# Patient Record
Sex: Male | Born: 1960
Health system: Southern US, Community
[De-identification: ages and names within clinical notes are randomized; demographics above are authoritative.]

## PROBLEM LIST (undated history)

## (undated) DIAGNOSIS — I10 Essential (primary) hypertension: Secondary | ICD-10-CM

## (undated) DIAGNOSIS — F32A Depression, unspecified: Secondary | ICD-10-CM

## (undated) DIAGNOSIS — F329 Major depressive disorder, single episode, unspecified: Secondary | ICD-10-CM

## (undated) DIAGNOSIS — M109 Gout, unspecified: Secondary | ICD-10-CM

## (undated) DIAGNOSIS — E119 Type 2 diabetes mellitus without complications: Secondary | ICD-10-CM

## (undated) DIAGNOSIS — N189 Chronic kidney disease, unspecified: Secondary | ICD-10-CM

## (undated) DIAGNOSIS — R296 Repeated falls: Secondary | ICD-10-CM

## (undated) DIAGNOSIS — E785 Hyperlipidemia, unspecified: Secondary | ICD-10-CM

## (undated) DIAGNOSIS — R2689 Other abnormalities of gait and mobility: Secondary | ICD-10-CM

## (undated) DIAGNOSIS — I639 Cerebral infarction, unspecified: Secondary | ICD-10-CM

## (undated) HISTORY — DX: Type 2 diabetes mellitus without complications: E11.9

## (undated) HISTORY — DX: Hyperlipidemia, unspecified: E78.5

## (undated) HISTORY — DX: Gout, unspecified: M10.9

## (undated) HISTORY — DX: Other abnormalities of gait and mobility: R26.89

## (undated) HISTORY — DX: Repeated falls: R29.6

## (undated) HISTORY — DX: Chronic kidney disease, unspecified: N18.9

## (undated) HISTORY — DX: Depression, unspecified: F32.A

---
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
## 2011-08-10 ENCOUNTER — Emergency Department (HOSPITAL_COMMUNITY)
Admission: EM | Admit: 2011-08-10 | Discharge: 2011-08-10 | Disposition: A | Discharge status: Home / Self Care | Payer: Self-pay | Attending: Emergency Medicine | Admitting: Emergency Medicine

## 2011-08-10 DIAGNOSIS — K089 Disorder of teeth and supporting structures, unspecified: Secondary | ICD-10-CM | POA: Insufficient documentation

## 2011-08-10 DIAGNOSIS — I10 Essential (primary) hypertension: Secondary | ICD-10-CM | POA: Insufficient documentation

## 2012-11-21 ENCOUNTER — Emergency Department (HOSPITAL_COMMUNITY)
Admission: EM | Admit: 2012-11-21 | Discharge: 2012-11-21 | Disposition: A | Discharge status: Home / Self Care | Payer: Self-pay | Attending: Emergency Medicine | Admitting: Emergency Medicine

## 2012-11-21 ENCOUNTER — Encounter (HOSPITAL_COMMUNITY): Payer: Self-pay | Admitting: *Deleted

## 2012-11-21 DIAGNOSIS — I10 Essential (primary) hypertension: Secondary | ICD-10-CM | POA: Insufficient documentation

## 2012-11-21 DIAGNOSIS — R6883 Chills (without fever): Secondary | ICD-10-CM | POA: Insufficient documentation

## 2012-11-21 DIAGNOSIS — K5289 Other specified noninfective gastroenteritis and colitis: Secondary | ICD-10-CM | POA: Insufficient documentation

## 2012-11-21 DIAGNOSIS — K529 Noninfective gastroenteritis and colitis, unspecified: Secondary | ICD-10-CM

## 2012-11-21 DIAGNOSIS — R059 Cough, unspecified: Secondary | ICD-10-CM | POA: Insufficient documentation

## 2012-11-21 DIAGNOSIS — Z8673 Personal history of transient ischemic attack (TIA), and cerebral infarction without residual deficits: Secondary | ICD-10-CM | POA: Insufficient documentation

## 2012-11-21 DIAGNOSIS — R197 Diarrhea, unspecified: Secondary | ICD-10-CM | POA: Insufficient documentation

## 2012-11-21 DIAGNOSIS — IMO0001 Reserved for inherently not codable concepts without codable children: Secondary | ICD-10-CM | POA: Insufficient documentation

## 2012-11-21 DIAGNOSIS — Z7982 Long term (current) use of aspirin: Secondary | ICD-10-CM | POA: Insufficient documentation

## 2012-11-21 DIAGNOSIS — F17200 Nicotine dependence, unspecified, uncomplicated: Secondary | ICD-10-CM | POA: Insufficient documentation

## 2012-11-21 DIAGNOSIS — J3489 Other specified disorders of nose and nasal sinuses: Secondary | ICD-10-CM | POA: Insufficient documentation

## 2012-11-21 DIAGNOSIS — R05 Cough: Secondary | ICD-10-CM | POA: Insufficient documentation

## 2012-11-21 HISTORY — DX: Cerebral infarction, unspecified: I63.9

## 2012-11-21 HISTORY — DX: Essential (primary) hypertension: I10

## 2012-11-21 LAB — COMPREHENSIVE METABOLIC PANEL
ALT: 13 U/L (ref 0–53)
AST: 14 U/L (ref 0–37)
Alkaline Phosphatase: 82 U/L (ref 39–117)
CO2: 23 mEq/L (ref 19–32)
Calcium: 9.1 mg/dL (ref 8.4–10.5)
Chloride: 103 mEq/L (ref 96–112)
GFR calc Af Amer: >90 mL/min (ref >90)
GFR calc non Af Amer: >90 mL/min (ref >90)
Glucose, Bld: 95 mg/dL (ref 70–99)
Potassium: 3.2 mEq/L — ABNORMAL LOW (ref 3.5–5.1)
Sodium: 137 mEq/L (ref 135–145)

## 2012-11-21 LAB — CBC WITH DIFFERENTIAL/PLATELET
Basophils Relative: 0 % (ref 0–1)
Eosinophils Absolute: 0.1 10*3/uL (ref 0.0–0.7)
Eosinophils Relative: 1 % (ref 0–5)
HCT: 42.2 % (ref 39.0–52.0)
Hemoglobin: 14.4 g/dL (ref 13.0–17.0)
Lymphocytes Relative: 30 % (ref 12–46)
MCHC: 34.1 g/dL (ref 30.0–36.0)
Monocytes Relative: 13 % — ABNORMAL HIGH (ref 3–12)
Neutro Abs: 6.8 10*3/uL (ref 1.7–7.7)
Neutrophils Relative %: 56 % (ref 43–77)
RBC: 5.65 MIL/uL (ref 4.22–5.81)
WBC: 12.1 10*3/uL — ABNORMAL HIGH (ref 4.0–10.5)

## 2012-11-21 MED ORDER — SODIUM CHLORIDE 0.9 % IV BOLUS (SEPSIS)
1000.0000 mL | Freq: Once | INTRAVENOUS | Status: AC
  Administered 2012-11-21: 1000 mL via INTRAVENOUS

## 2012-11-21 MED ORDER — ONDANSETRON HCL 4 MG/2ML IJ SOLN
4.0000 mg | Freq: Once | INTRAMUSCULAR | Status: AC
  Administered 2012-11-21: 4 mg via INTRAVENOUS
  Filled 2012-11-21: qty 2

## 2012-11-21 MED ORDER — KETOROLAC TROMETHAMINE 30 MG/ML IJ SOLN
30.0000 mg | Freq: Once | INTRAMUSCULAR | Status: AC
  Administered 2012-11-21: 30 mg via INTRAVENOUS
  Filled 2012-11-21: qty 1

## 2012-11-21 MED ORDER — POTASSIUM CHLORIDE CRYS ER 20 MEQ PO TBCR
40.0000 meq | EXTENDED_RELEASE_TABLET | Freq: Once | ORAL | Status: AC
  Administered 2012-11-21: 40 meq via ORAL
  Filled 2012-11-21: qty 2

## 2012-11-21 NOTE — ED Notes (Signed)
N/v/d, abd cramping, chills, cough, congestion x 4 days.

## 2012-11-21 NOTE — ED Provider Notes (Signed)
History    Scribed for Isaiah Lennert, MD, the patient was seen in room APA06/APA06. This chart was scribed by Katha Cabal.   CSN: [id number]  Arrival date & time 11/21/12  0821   First MD Initiated Contact with Patient 11/21/12 [phone]      Chief Complaint  Patient presents with  . n/v/d      (Consider location/radiation/quality/duration/timing/severity/associated sxs/prior Treatment)  Isaiah Lennert, MD entered patient's room at 8:36 AM  Patient is a 52 y.o. male presenting with diarrhea. The history is provided by the patient and the spouse. No language interpreter was used.  Diarrhea The primary symptoms include abdominal pain, nausea, vomiting, diarrhea and myalgias. Primary symptoms do not include fatigue or rash. Fever: resolved. The illness began 3 to 5 days ago. The problem has not changed since onset. The abdominal pain is generalized.  The diarrhea is watery.  The illness is also significant for chills. The illness does not include back pain.      Isaiah Ellis is a 52 y.o. male complains of persistent nausea, vomiting, diarrhea with associated abdominal aches for the past 4 days.  Patient has taken ASA and ibuprofen.  Reports two episodes of watery stools today.  Patient with history of hypertension but is not taking medication.      No primary provider on file.       Past Medical History  Diagnosis Date  . Hypertension   . Stroke     History reviewed. No pertinent past surgical history.  No family history on file.  History  Substance Use Topics  . Smoking status: Current Every Day Smoker    Types: Cigarettes  . Smokeless tobacco: Not on file  . Alcohol Use: Yes     Comment: occasional      Review of Systems  Constitutional: Positive for chills. Negative for fatigue. Fever: resolved.  HENT: Positive for congestion. Negative for sinus pressure and ear discharge.   Eyes: Negative for discharge.  Respiratory: Positive for cough.     Cardiovascular: Negative for chest pain.  Gastrointestinal: Positive for nausea, vomiting, abdominal pain and diarrhea.  Genitourinary: Negative for frequency and hematuria.  Musculoskeletal: Positive for myalgias. Negative for back pain.  Skin: Negative for rash.  Neurological: Negative for seizures and headaches.  Hematological: Negative.   Psychiatric/Behavioral: Negative for hallucinations.    Allergies  Review of patient's allergies indicates no known allergies.  Home Medications   Current Outpatient Rx  Name  Route  Sig  Dispense  Refill  . ASPIRIN 325 MG PO TABS   Oral   Take 325 mg by mouth daily.         . IBUPROFEN 200 MG PO TABS   Oral   Take 800 mg by mouth every 6 (six) hours as needed. Pain.           BP 119/78  Pulse 71  Temp 97.8 F (36.6 C) (Oral)  Resp 19  Ht 6\' 1"  (1.854 m)  Wt 300 lb (136.079 kg)  BMI 39.58 kg/m2  SpO2 98%  Physical Exam  Constitutional: He is oriented to person, place, and time. He appears well-developed.  HENT:  Head: Normocephalic and atraumatic.  Eyes: Conjunctivae normal and EOM are normal. No scleral icterus.  Neck: Neck supple. No thyromegaly present.  Cardiovascular: Normal rate and regular rhythm.  Exam reveals no gallop and no friction rub.   No murmur heard. Pulmonary/Chest: No stridor. He has no wheezes. He has no rales. He  exhibits no tenderness.  Abdominal: He exhibits no distension. There is no tenderness. There is no rebound.  Musculoskeletal: Normal range of motion. He exhibits no edema.  Lymphadenopathy:    He has no cervical adenopathy.  Neurological: He is oriented to person, place, and time. Coordination normal.  Skin: No rash noted. No erythema.  Psychiatric: He has a normal mood and affect. His behavior is normal.    ED Course  Procedures (including critical care time)    DIAGNOSTIC STUDIES: Oxygen Saturation is 98% on room air normal by my interpretation.     COORDINATION OF CARE:   8:40  AM  Physical exam complete.  Vital signs reviewed.  Will order antiemetic.     8:45 AM  Toradol and Zofran ordered.  IV fluids.   11:08 AM  Discussed radiological findings with patient.   11:10 AM  Plan to discharge patient.  Patient agrees with plan.       LABS / RADIOLOGY:   Labs Reviewed  CBC WITH DIFFERENTIAL - Abnormal; Notable for the following:    WBC 12.1 (*)     MCV 74.7 (*)     MCH 25.5 (*)     RDW 16.1 (*)     Monocytes Relative 13 (*)     Monocytes Absolute 1.6 (*)     All other components within normal limits  COMPREHENSIVE METABOLIC PANEL - Abnormal; Notable for the following:    Potassium 3.2 (*)     All other components within normal limits   No results found.       MDM             MEDICATIONS GIVEN IN THE E.D. Scheduled Meds:    Continuous Infusions:        IMPRESSION: No diagnosis found.   NEW MEDICATIONS: New Prescriptions   No medications on file      The chart was scribed for me under my direct supervision.  I personally performed the history, physical, and medical decision making and all procedures in the evaluation of this patient.Isaiah Lennert, MD 11/21/12 [phone]

## 2019-02-13 ENCOUNTER — Emergency Department (HOSPITAL_COMMUNITY): Payer: Self-pay

## 2019-02-13 ENCOUNTER — Encounter (HOSPITAL_COMMUNITY): Payer: Self-pay | Admitting: Emergency Medicine

## 2019-02-13 ENCOUNTER — Other Ambulatory Visit: Payer: Self-pay

## 2019-02-13 ENCOUNTER — Emergency Department (HOSPITAL_COMMUNITY)
Admission: EM | Admit: 2019-02-13 | Discharge: 2019-02-13 | Disposition: A | Discharge status: Home / Self Care | Payer: Self-pay | Attending: Emergency Medicine | Admitting: Emergency Medicine

## 2019-02-13 DIAGNOSIS — F17210 Nicotine dependence, cigarettes, uncomplicated: Secondary | ICD-10-CM | POA: Insufficient documentation

## 2019-02-13 DIAGNOSIS — Z8673 Personal history of transient ischemic attack (TIA), and cerebral infarction without residual deficits: Secondary | ICD-10-CM | POA: Insufficient documentation

## 2019-02-13 DIAGNOSIS — I10 Essential (primary) hypertension: Secondary | ICD-10-CM | POA: Insufficient documentation

## 2019-02-13 DIAGNOSIS — Z7982 Long term (current) use of aspirin: Secondary | ICD-10-CM | POA: Insufficient documentation

## 2019-02-13 LAB — CBG MONITORING, ED: Glucose-Capillary: 140 mg/dL — ABNORMAL HIGH (ref 70–99)

## 2019-02-13 LAB — COMPREHENSIVE METABOLIC PANEL
ALT: 12 U/L (ref 0–44)
AST: 19 U/L (ref 15–41)
Albumin: 4.3 g/dL (ref 3.5–5.0)
Alkaline Phosphatase: 98 U/L (ref 38–126)
Anion gap: 8 (ref 5–15)
BUN: 15 mg/dL (ref 6–20)
CO2: 22 mmol/L (ref 22–32)
CREATININE: 1.25 mg/dL — AB (ref 0.61–1.24)
Calcium: 9.4 mg/dL (ref 8.9–10.3)
Chloride: 109 mmol/L (ref 98–111)
GFR calc Af Amer: >60 mL/min (ref >60)
GFR calc non Af Amer: >60 mL/min (ref >60)
Glucose, Bld: 132 mg/dL — ABNORMAL HIGH (ref 70–99)
POTASSIUM: 3.6 mmol/L (ref 3.5–5.1)
SODIUM: 139 mmol/L (ref 135–145)
Total Bilirubin: 0.5 mg/dL (ref 0.3–1.2)
Total Protein: 8 g/dL (ref 6.5–8.1)

## 2019-02-13 LAB — DIFFERENTIAL
ABS IMMATURE GRANULOCYTES: 0.02 10*3/uL (ref 0.00–0.07)
BASOS ABS: 0.1 10*3/uL (ref 0.0–0.1)
Basophils Relative: 1 %
EOS ABS: 0.2 10*3/uL (ref 0.0–0.5)
Eosinophils Relative: 2 %
IMMATURE GRANULOCYTES: 0 %
Lymphocytes Relative: 22 %
Lymphs Abs: 2 10*3/uL (ref 0.7–4.0)
Monocytes Absolute: 0.8 10*3/uL (ref 0.1–1.0)
Monocytes Relative: 9 %
NEUTROS ABS: 6 10*3/uL (ref 1.7–7.7)
Neutrophils Relative %: 66 %

## 2019-02-13 LAB — APTT: aPTT: 34 seconds (ref 24–36)

## 2019-02-13 LAB — CBC
HCT: 47.3 % (ref 39.0–52.0)
HEMOGLOBIN: 15.2 g/dL (ref 13.0–17.0)
MCH: 24.2 pg — ABNORMAL LOW (ref 26.0–34.0)
MCHC: 32.1 g/dL (ref 30.0–36.0)
MCV: 75.2 fL — ABNORMAL LOW (ref 80.0–100.0)
NRBC: 0 % (ref 0.0–0.2)
Platelets: 256 10*3/uL (ref 150–400)
RBC: 6.29 MIL/uL — AB (ref 4.22–5.81)
RDW: 18.2 % — ABNORMAL HIGH (ref 11.5–15.5)
WBC: 9.1 10*3/uL (ref 4.0–10.5)

## 2019-02-13 LAB — PROTIME-INR
INR: 1 (ref 0.8–1.2)
Prothrombin Time: 13.4 seconds (ref 11.4–15.2)

## 2019-02-13 MED ORDER — HYDRALAZINE HCL 25 MG PO TABS
25.0000 mg | ORAL_TABLET | Freq: Once | ORAL | Status: AC
  Administered 2019-02-13: 25 mg via ORAL
  Filled 2019-02-13: qty 1

## 2019-02-13 MED ORDER — HYDROCHLOROTHIAZIDE 25 MG PO TABS: 25.0000 mg | ORAL_TABLET | Freq: Every day | ORAL | 1 refills | Status: DC

## 2019-02-13 MED ORDER — AMLODIPINE BESYLATE 5 MG PO TABS: 5.0000 mg | ORAL_TABLET | Freq: Every day | ORAL | 1 refills | Status: DC

## 2019-02-13 MED ORDER — LABETALOL HCL 5 MG/ML IV SOLN
20.0000 mg | INTRAVENOUS | Status: DC | PRN
  Administered 2019-02-13 (×3): 20 mg via INTRAVENOUS
  Filled 2019-02-13 (×3): qty 4

## 2019-02-13 MED ORDER — LISINOPRIL 10 MG PO TABS: 10.0000 mg | ORAL_TABLET | Freq: Every day | ORAL | 1 refills | Status: DC

## 2019-02-13 MED ORDER — HYDRALAZINE HCL 25 MG PO TABS: 25.0000 mg | ORAL_TABLET | Freq: Four times a day (QID) | ORAL | 1 refills | Status: DC

## 2019-02-13 MED ORDER — LISINOPRIL 10 MG PO TABS: 10.0000 mg | ORAL_TABLET | Freq: Every day | ORAL | 0 refills | Status: DC

## 2019-02-13 MED ORDER — AMLODIPINE BESYLATE 5 MG PO TABS
5.0000 mg | ORAL_TABLET | Freq: Once | ORAL | Status: AC
  Administered 2019-02-13: 5 mg via ORAL
  Filled 2019-02-13: qty 1

## 2019-02-13 MED ORDER — SODIUM CHLORIDE 0.9% FLUSH
3.0000 mL | Freq: Once | INTRAVENOUS | Status: DC
Start: 2019-02-13 — End: 2019-02-13

## 2019-02-13 MED ORDER — HYDROCHLOROTHIAZIDE 25 MG PO TABS
25.0000 mg | ORAL_TABLET | Freq: Every day | ORAL | Status: DC
  Administered 2019-02-13: 25 mg via ORAL
  Filled 2019-02-13: qty 1

## 2019-02-13 NOTE — ED Triage Notes (Signed)
Patient c/o slurred speech and right side weakness that started Monday morning after waking up.  Per patient fine Sunday.Per patient balance "off" but not dizzy. Denies any difficulty swallowing or headache. Per patient hx stroke 2015, all symptoms from prior stroke have now resolved. Per patient was similar symptoms in 2015 as now.

## 2019-02-13 NOTE — ED Provider Notes (Signed)
Patient rechecked prior to discharge.  No neurological deficits.  Blood pressure slightly improved.  Discharge medication hydrochlorothiazide 25 mg daily, lisinopril 10 mg daily, Norvasc 5 mg daily.  Patient will get primary care follow-up.   Donnetta Hutching, MD 02/13/19 [phone]

## 2019-02-13 NOTE — ED Provider Notes (Signed)
[facility] EMERGENCY DEPARTMENT Provider Note   CSN: [id number] Arrival date & time: 02/13/19  1228    History   Chief Complaint Chief Complaint  Patient presents with  . Aphasia    HPI Isaiah Ellis is a 58 y.o. male.     HPI   He presents for evaluation of "a funny feeling of my right side."  Complains of a tingling sensation in his right arm and leg, and feeling off balance when he walks.  He denies headache, chest pain, shortness of breath, focal weakness, or vertigo.  He takes aspirin 3 times a day to help keep his blood pressure down.  He knows he has high blood pressure but does not take medicine for it.  He said he had a stroke, about 5 years ago without residual effects, after "rehab."  There are no other known modifying factors.   Past Medical History:  Diagnosis Date  . Hypertension   . Stroke [facility])     There are no active problems to display for this patient.   No past surgical history on file.      Home Medications    Prior to Admission medications   Medication Sig Start Date End Date Taking? Authorizing Provider  aspirin 325 MG tablet Take 325 mg by mouth daily.    [provider]  ibuprofen (ADVIL,MOTRIN) 200 MG tablet Take 800 mg by mouth every 6 (six) hours as needed. Pain.    [provider]    Family History No family history on file.  Social History Social History   Tobacco Use  . Smoking status: Current Every Day Smoker    Types: Cigarettes  . Smokeless tobacco: Never Used  Substance Use Topics  . Alcohol use: Yes    Comment: occasional  . Drug use: Yes    Types: Marijuana     Allergies   Bee venom   Review of Systems Review of Systems  All other systems reviewed and are negative.    Physical Exam Updated Vital Signs BP (!) 219/129 (BP Location: Right Arm)   Pulse 89   Temp 99.3 F (37.4 C) (Oral)   Resp (!) 30   Ht 6\' 1"  (1.854 m)   Wt 136.1 kg   SpO2 96%   BMI 39.58 kg/m   Physical Exam  Vitals signs and nursing note reviewed.  Constitutional:      General: He is not in acute distress.    Appearance: He is well-developed. He is not ill-appearing, toxic-appearing or diaphoretic.  HENT:     Head: Normocephalic and atraumatic.     Right Ear: External ear normal.     Left Ear: External ear normal.  Eyes:     Conjunctiva/sclera: Conjunctivae normal.     Pupils: Pupils are equal, round, and reactive to light.  Neck:     Musculoskeletal: Normal range of motion and neck supple.     Trachea: Phonation normal.  Cardiovascular:     Rate and Rhythm: Normal rate and regular rhythm.     Heart sounds: Normal heart sounds.  Pulmonary:     Effort: Pulmonary effort is normal.     Breath sounds: Normal breath sounds.  Abdominal:     Palpations: Abdomen is soft.     Tenderness: There is no abdominal tenderness.  Musculoskeletal: Normal range of motion.  Skin:    General: Skin is warm and dry.  Neurological:     Mental Status: He is alert and oriented to person,  place, and time.     Cranial Nerves: No cranial nerve deficit.     Sensory: No sensory deficit.     Motor: No abnormal muscle tone.     Coordination: Coordination normal.     Comments: No dysarthria or aphasia.  No pronator drift.  No ataxia.  Slightly decreased sensation right hand otherwise normal sensation bilaterally.  Psychiatric:        Mood and Affect: Mood normal.        Behavior: Behavior normal.        Thought Content: Thought content normal.        Judgment: Judgment normal.      ED Treatments / Results  Labs (all labs ordered are listed, but only abnormal results are displayed) Labs Reviewed  CBG MONITORING, ED - Abnormal; Notable for the following components:      Result Value   Glucose-Capillary 140 (*)    All other components within normal limits  PROTIME-INR  APTT  CBC  DIFFERENTIAL  COMPREHENSIVE METABOLIC PANEL  CBG MONITORING, ED    EKG EKG Interpretation  Date/Time:  Sunday February 13 2019 12:40:40 EDT Ventricular Rate:  89 PR Interval:    QRS Duration: 154 QT Interval:  407 QTC Calculation: 496 R Axis:   -90 Text Interpretation:  Sinus rhythm Probable left atrial enlargement RBBB and LAFB LVH by voltage Baseline wander in lead(s) II III aVR aVL aVF No old tracing to compare Confirmed by Mancel BaleWentz, Irelynn Schermerhorn [phone]) on 02/13/2019 12:47:36 PM   Radiology No results found.  Procedures .Critical Care Performed by: Mancel BaleWentz, Harrie Cazarez, MD Authorized by: Mancel BaleWentz, Jahari Billy, MD   Critical care provider statement:    Critical care time (minutes):  35   Critical care start time:  02/13/2019 12:25 PM   Critical care end time:  02/13/2019 4:10 PM   Critical care time was exclusive of:  Separately billable procedures and treating other patients   Critical care was time spent personally by me on the following activities:  Blood draw for specimens, development of treatment plan with patient or surrogate, discussions with consultants, evaluation of patient's response to treatment, examination of patient, obtaining history from patient or surrogate, ordering and performing treatments and interventions, ordering and review of laboratory studies, pulse oximetry, re-evaluation of patient's condition, review of old charts and ordering and review of radiographic studies   (including critical care time)  Medications Ordered in ED Medications  sodium chloride flush (NS) 0.9 % injection 3 mL (has no administration in time range)     Initial Impression / Assessment and Plan / ED Course  I have reviewed the triage vital signs and the nursing notes.  Pertinent labs & imaging results that were available during my care of the patient were reviewed by me and considered in my medical decision making (see chart for details).  Clinical Course as of Feb 12 1249  Sun Feb 13, 2019  1250 High blood pressure  BP(!): 219/129 [EW]    Clinical Course User Index [EW] Mancel BaleWentz, Jaicey Sweaney, MD        Patient  Vitals for the past 24 hrs:  BP Temp Temp src Pulse Resp SpO2 Height Weight  02/13/19 1809 (!) 186/138 98.3 F (36.8 C) Oral 70 16 99 % - -  02/13/19 1730 (!) 181/113 - - (!) 50 16 94 % - -  02/13/19 1700 (!) 194/116 - - 64 20 98 % - -  02/13/19 1630 (!) 186/108 - - 64 18 100 % - -  02/13/19 1600 (!) 153/138 - - (!) 54 16 95 % - -  02/13/19 1550 (!) 190/120 - - 78 (!) 25 100 % - -  02/13/19 1510 (!) 165/114 - - 61 17 98 % - -  02/13/19 1500 (!) 174/111 - - 65 (!) 25 96 % - -  02/13/19 1450 (!) 165/98 - - 66 16 97 % - -  02/13/19 1446 (!) 165/135 - - 64 (!) 23 93 % - -  02/13/19 1432 (!) 186/110 - - 66 (!) 26 97 % - -  02/13/19 1420 (!) 182/136 - - 71 14 100 % - -  02/13/19 1410 (!) 170/142 - - 74 18 99 % - -  02/13/19 1400 (!) 171/125 - - 75 20 94 % - -  02/13/19 1351 (!) 158/123 - - 74 20 95 % - -  02/13/19 1340 (!) 179/155 - - 82 (!) 23 96 % - -  02/13/19 1330 (!) 162/126 - - 85 16 99 % - -  02/13/19 1300 (!) 191/131 - - 83 18 97 % - -  02/13/19 1241 (!) 219/129 99.3 F (37.4 C) Oral 89 (!) 30 96 % - -  02/13/19 1236 - - - - - - 6\' 1"  (1.854 m) 136.1 kg    4:00 PM Reevaluation with update and discussion. After initial assessment and treatment, an updated evaluation reveals patient denies paresthesia in the right face hand or leg.  He is sitting up comfortably, and has no physical complaints.  He states that he is unable to afford medications.  I have consulted case management to help him get guidance on how he can get medications.  Medication prescriptions written.  Findings discussed and questions answered. Mancel Bale   Medical Decision Making: Hypertension, requiring treatment in the ED, multiple doses of short acting antihypertensive given with improvement.  He was started on oral medications, which will likely improve his blood pressure.  Doubt hypertensive urgency, ACS, PE or pneumonia.  CRITICAL CARE-yes Performed by: Mancel Bale  Nursing Notes Reviewed/ Care Coordinated  Applicable Imaging Reviewed Interpretation of Laboratory Data incorporated into ED treatment  Plan-discharge after consult with case management complete.  Oncoming provider team to arrange final discharge.  Patient will be encouraged to follow-up with PCP as soon as possible.  He is given medication, to last 2 months, to allow him time to get to see ECP.  He is encouraged to come back here if needed for problems.   Final Clinical Impressions(s) / ED Diagnoses   Final diagnoses:  Hypertension, unspecified type    ED Discharge Orders    None       Mancel Bale, MD 02/14/19 [phone]

## 2019-02-13 NOTE — Discharge Instructions (Addendum)
Start the blood pressure medicines as soon as possible.  Stay on a low-salt diet as instructed on the attached paperwork.  Use the resource guide to help you find a primary care doctor to see you soon as possible.  Name and number for local clinic given.  Return here, if needed, for problems.

## 2019-02-13 NOTE — Progress Notes (Signed)
[facility] resources:  [employer] Program: [address] [place], [place] [address]. [phone]  [facility]: [address] [place], [address], [place], [place], [address]. [phone]   Or go to [facility] and utilize generic $4 list.

## 2019-02-28 ENCOUNTER — Other Ambulatory Visit: Payer: Self-pay

## 2019-02-28 ENCOUNTER — Emergency Department (HOSPITAL_COMMUNITY)
Admission: EM | Admit: 2019-02-28 | Discharge: 2019-02-28 | Disposition: A | Discharge status: Home / Self Care | Payer: Self-pay | Attending: Emergency Medicine | Admitting: Emergency Medicine

## 2019-02-28 ENCOUNTER — Emergency Department (HOSPITAL_COMMUNITY): Payer: Self-pay

## 2019-02-28 DIAGNOSIS — Z79899 Other long term (current) drug therapy: Secondary | ICD-10-CM | POA: Insufficient documentation

## 2019-02-28 DIAGNOSIS — G459 Transient cerebral ischemic attack, unspecified: Secondary | ICD-10-CM | POA: Insufficient documentation

## 2019-02-28 DIAGNOSIS — Z8673 Personal history of transient ischemic attack (TIA), and cerebral infarction without residual deficits: Secondary | ICD-10-CM | POA: Insufficient documentation

## 2019-02-28 DIAGNOSIS — I10 Essential (primary) hypertension: Secondary | ICD-10-CM | POA: Insufficient documentation

## 2019-02-28 DIAGNOSIS — F17210 Nicotine dependence, cigarettes, uncomplicated: Secondary | ICD-10-CM | POA: Insufficient documentation

## 2019-02-28 DIAGNOSIS — Z7982 Long term (current) use of aspirin: Secondary | ICD-10-CM | POA: Insufficient documentation

## 2019-02-28 LAB — CBC WITH DIFFERENTIAL/PLATELET
Abs Immature Granulocytes: 0.02 10*3/uL (ref 0.00–0.07)
Basophils Absolute: 0.1 10*3/uL (ref 0.0–0.1)
Basophils Relative: 2 %
Eosinophils Absolute: 0.5 10*3/uL (ref 0.0–0.5)
Eosinophils Relative: 7 %
HCT: 44 % (ref 39.0–52.0)
Hemoglobin: 14.2 g/dL (ref 13.0–17.0)
Immature Granulocytes: 0 %
Lymphocytes Relative: 24 %
Lymphs Abs: 1.5 10*3/uL (ref 0.7–4.0)
MCH: 24.1 pg — ABNORMAL LOW (ref 26.0–34.0)
MCHC: 32.3 g/dL (ref 30.0–36.0)
MCV: 74.6 fL — ABNORMAL LOW (ref 80.0–100.0)
Monocytes Absolute: 0.8 10*3/uL (ref 0.1–1.0)
Monocytes Relative: 12 %
Neutro Abs: 3.4 10*3/uL (ref 1.7–7.7)
Neutrophils Relative %: 55 %
Platelets: 330 10*3/uL (ref 150–400)
RBC: 5.9 MIL/uL — ABNORMAL HIGH (ref 4.22–5.81)
RDW: 16.3 % — ABNORMAL HIGH (ref 11.5–15.5)
WBC: 6.2 10*3/uL (ref 4.0–10.5)
nRBC: 0 % (ref 0.0–0.2)

## 2019-02-28 LAB — URINALYSIS, ROUTINE W REFLEX MICROSCOPIC
Bilirubin Urine: NEGATIVE
Glucose, UA: NEGATIVE mg/dL
Hgb urine dipstick: NEGATIVE
Ketones, ur: NEGATIVE mg/dL
Leukocytes,Ua: NEGATIVE
Nitrite: NEGATIVE
Protein, ur: NEGATIVE mg/dL
Specific Gravity, Urine: 1.011 (ref 1.005–1.030)
pH: 6 (ref 5.0–8.0)

## 2019-02-28 LAB — COMPREHENSIVE METABOLIC PANEL
ALT: 18 U/L (ref 0–44)
AST: 24 U/L (ref 15–41)
Albumin: 4.4 g/dL (ref 3.5–5.0)
Alkaline Phosphatase: 94 U/L (ref 38–126)
Anion gap: 10 (ref 5–15)
BUN: 16 mg/dL (ref 6–20)
CO2: 23 mmol/L (ref 22–32)
Calcium: 9.6 mg/dL (ref 8.9–10.3)
Chloride: 104 mmol/L (ref 98–111)
Creatinine, Ser: 1.32 mg/dL — ABNORMAL HIGH (ref 0.61–1.24)
GFR calc Af Amer: >60 mL/min (ref >60)
GFR calc non Af Amer: 59 mL/min — ABNORMAL LOW (ref >60)
Glucose, Bld: 95 mg/dL (ref 70–99)
Potassium: 3.3 mmol/L — ABNORMAL LOW (ref 3.5–5.1)
Sodium: 137 mmol/L (ref 135–145)
Total Bilirubin: 0.9 mg/dL (ref 0.3–1.2)
Total Protein: 7.5 g/dL (ref 6.5–8.1)

## 2019-02-28 LAB — TROPONIN I: Troponin I: <0.03 ng/mL (ref <0.03)

## 2019-02-28 MED ORDER — POTASSIUM CHLORIDE CRYS ER 20 MEQ PO TBCR
40.0000 meq | EXTENDED_RELEASE_TABLET | Freq: Once | ORAL | Status: AC
  Administered 2019-02-28: 40 meq via ORAL
  Filled 2019-02-28: qty 2

## 2019-02-28 NOTE — Discharge Instructions (Addendum)
Your blood pressure is fluctuating up and down, but your vital signs are otherwise within normal limits.  Your oxygen level is 100% on room air.  Your CT scan did not show any new evidence of stroke.  Your lab results are negative for acute changes.  Your examination suggest a transient change in circulation within your brain.  This is called a transient ischemic attack or TIA.  Please take your aspirin and your medications as prescribed.  Please return immediately if your symptoms worsen, or if there are any new symptoms.

## 2019-02-28 NOTE — ED Notes (Signed)
ED Provider at bedside. 

## 2019-02-28 NOTE — Progress Notes (Signed)
CSW met with pt at bedside to discuss discharge needs and reason for consult. CSW used active listening as Pt reported a recent stroke. Pt goes into detail and explains that he is homeless, without a PCP and insurance.   Although Pt expressed he felt he could benefit from SNF, as it would provide some temporary relief from his homelessness situation, EDP did not make that recommendation at this time.   CSW educated Pt on [facility] located in [place], [place]. [place] instructed Pt to follow up with [facility] for his primary care needs.     [facility]  Clinical Social Worker  Ph: [phone]

## 2019-02-28 NOTE — ED Provider Notes (Signed)
[facility] EMERGENCY DEPARTMENT Provider Note   CSN: [id number] Arrival date & time: 02/28/19  1225    History   Chief Complaint Chief Complaint  Patient presents with   Weakness    HPI Isaiah Ellis is a 58 y.o. male.     Patient is a 58 year old male who presents to the emergency department with weakness.  Patient has a history of hypertension, and cerebrovascular accident in the past with residual right-sided weakness.  The patient states that he became very concerned and frightened about the storm that was occurring this morning.  He got up to evaluate what was going on around his home, and when he went to the window he noted that there was a increased weakness of the right side.  He says that this white right side was weaker than his usual residual weakness from the prior a previous stroke.  He thinks that it may have lasted as long as 45 minutes.  He states that he began to have some tingling of his right greater than left hand.  And he noticed that he was breathing hard.  He went to the bathroom, but struggled to get there using his cane.  He usually uses his cane with getting around.  It seems as though his symptoms were getting worse.  He says that he felt off balance for a while and went back to bed to lay down.  During this time he did not have any chest pain.  There were no sweats.  There was no loss of consciousness.  No difficulty speaking, no difficulty swallowing.  He did not lose control of bowels or bladder.  The patient states that he continued to have a mild headache.  And it did not seem as though his symptoms were getting any better and so he decided to come to the emergency department for evaluation.  The history is provided by the patient.  Weakness  Associated symptoms: arthralgias and headaches   Associated symptoms: no abdominal pain, no chest pain, no cough, no dizziness, no dysuria, no frequency, no seizures and no shortness of breath     Past Medical  History:  Diagnosis Date   Hypertension    Stroke (HCC)     There are no active problems to display for this patient.   No past surgical history on file.      Home Medications    Prior to Admission medications   Medication Sig Start Date End Date Taking? Authorizing Provider  amLODipine (NORVASC) 5 MG tablet Take 1 tablet (5 mg total) by mouth daily. 02/13/19   Donnetta Hutching, MD  aspirin 325 MG tablet Take 325 mg by mouth daily.    [provider]  hydrochlorothiazide (HYDRODIURIL) 25 MG tablet Take 1 tablet (25 mg total) by mouth daily. 02/13/19   Donnetta Hutching, MD  ibuprofen (ADVIL,MOTRIN) 200 MG tablet Take 800 mg by mouth every 6 (six) hours as needed. Pain.    [provider]  lisinopril (PRINIVIL,ZESTRIL) 10 MG tablet Take 1 tablet (10 mg total) by mouth daily. 02/13/19   Donnetta Hutching, MD    Family History No family history on file.  Social History Social History   Tobacco Use   Smoking status: Current Every Day Smoker    Types: Cigarettes   Smokeless tobacco: Never Used  Substance Use Topics   Alcohol use: Yes    Comment: occasional   Drug use: Yes    Types: Marijuana     Allergies  Bee venom   Review of Systems Review of Systems  Constitutional: Negative for activity change.       All ROS Neg except as noted in HPI  HENT: Negative for nosebleeds.   Eyes: Negative for photophobia and discharge.  Respiratory: Negative for cough, shortness of breath and wheezing.   Cardiovascular: Negative for chest pain and palpitations.  Gastrointestinal: Negative for abdominal pain and blood in stool.  Genitourinary: Negative for dysuria, frequency and hematuria.  Musculoskeletal: Positive for arthralgias. Negative for back pain and neck pain.  Skin: Negative.   Neurological: Positive for weakness and headaches. Negative for dizziness, seizures, syncope, facial asymmetry and speech difficulty.  Psychiatric/Behavioral: Negative for confusion and  hallucinations. The patient is nervous/anxious.      Physical Exam Updated Vital Signs BP 120/85    Pulse 70    Temp 98.1 F (36.7 C) (Oral)    Resp (!) 26    Ht 6\' 1"  (1.854 m)    Wt 136.1 kg    SpO2 95%    BMI 39.58 kg/m   Physical Exam Vitals signs and nursing note reviewed.  Constitutional:      Appearance: He is well-developed. He is not toxic-appearing.  HENT:     Head: Normocephalic.     Right Ear: Tympanic membrane and external ear normal.     Left Ear: Tympanic membrane and external ear normal.  Eyes:     General: Lids are normal.     Pupils: Pupils are equal, round, and reactive to light.  Neck:     Musculoskeletal: Normal range of motion and neck supple.     Vascular: No carotid bruit.  Cardiovascular:     Rate and Rhythm: Normal rate and regular rhythm.     Pulses: Normal pulses.     Heart sounds: Normal heart sounds.  Pulmonary:     Effort: No respiratory distress.     Breath sounds: Normal breath sounds.  Abdominal:     General: Bowel sounds are normal.     Palpations: Abdomen is soft.     Tenderness: There is no abdominal tenderness. There is no guarding.  Musculoskeletal: Normal range of motion.  Lymphadenopathy:     Head:     Right side of head: No submandibular adenopathy.     Left side of head: No submandibular adenopathy.     Cervical: No cervical adenopathy.  Skin:    General: Skin is warm and dry.  Neurological:     Mental Status: He is alert and oriented to person, place, and time.     Cranial Nerves: No cranial nerve deficit.     Sensory: No sensory deficit.     Motor: Weakness present.     Comments: Mild to moderate right-sided weakness present.  Upper extremity more pronounced than lower extremity.  No sensory deficit of the right or left extremities.  Speech is clear and understandable.  Gag reflex is in place.  No ulnar drift noted.  Psychiatric:        Mood and Affect: Mood is anxious.        Speech: Speech normal.      ED  Treatments / Results  Labs (all labs ordered are listed, but only abnormal results are displayed) Labs Reviewed  COMPREHENSIVE METABOLIC PANEL - Abnormal; Notable for the following components:      Result Value   Potassium 3.3 (*)    Creatinine, Ser 1.32 (*)    GFR calc non Af Amer 59 (*)  All other components within normal limits  CBC WITH DIFFERENTIAL/PLATELET - Abnormal; Notable for the following components:   RBC 5.90 (*)    MCV 74.6 (*)    MCH 24.1 (*)    RDW 16.3 (*)    All other components within normal limits  TROPONIN I  URINALYSIS, ROUTINE W REFLEX MICROSCOPIC    EKG EKG Interpretation  Date/Time:  Monday February 28 2019 13:10:59 EDT Ventricular Rate:  76 PR Interval:    QRS Duration: 167 QT Interval:  453 QTC Calculation: 510 R Axis:   -79 Text Interpretation:  Sinus rhythm RBBB and LAFB LVH by voltage Prolonged QT interval Confirmed by Kennis CarinaBero, Michael [phone]([id number]) on 02/28/2019 1:17:44 PM   Radiology Ct Head Wo Contrast  Result Date: 02/28/2019 CLINICAL DATA:  Right-sided weakness for the past 2 weeks. History of hypertension and stroke. EXAM: CT HEAD WITHOUT CONTRAST TECHNIQUE: Contiguous axial images were obtained from the base of the skull through the vertex without intravenous contrast. COMPARISON:  02/13/2019 FINDINGS: Brain: Redemonstrated volume loss with sulcal prominence. Re demonstrated periventricular hypodensities compatible with microvascular ischemic disease. Old lacunar infarct within the left basal ganglia (image 11, series 2), unchanged. Given background parenchymal abnormalities, there is no CT evidence of superimposed acute large territory infarct. No intraparenchymal or extra-axial mass or hemorrhage. Unchanged size and configuration of the ventricles and the basilar cisterns. No midline shift. Vascular: No hyperdense vessel or unexpected calcification. Skull: No displaced calvarial fracture. Sinuses/Orbits: Limited visualization of the paranasal sinuses  and mastoid air cells is normal. No air-fluid levels. Other: Regional soft tissues appear normal. IMPRESSION: Similar findings of atrophy and microvascular ischemic disease without superimposed acute intracranial process. Electronically Signed   By: Simonne ComeJohn  Watts M.D.   On: 02/28/2019 13:30    Procedures Procedures (including critical care time)  Medications Ordered in ED Medications  potassium chloride SA (K-DUR,KLOR-CON) CR tablet 40 mEq (has no administration in time range)     Initial Impression / Assessment and Plan / ED Course  I have reviewed the triage vital signs and the nursing notes.  Pertinent labs & imaging results that were available during my care of the patient were reviewed by me and considered in my medical decision making (see chart for details).          Final Clinical Impressions(s) / ED Diagnoses MDM Pt seen with me by Dr Pilar PlateBero. Pulse oximetry is 97% on room air.  Within normal limits by my interpretation.  Respiratory rate fluctuates from 18-25.  The remainder the vital signs are within normal limits.  The patient noted increased weakness this morning starting between 6 and 7 AM.  He says there was about 45 minutes in which he could not use his right upper extremity.  This was an increase from previous cerebrovascular accident.  CT head scan shows similar findings of atrophy and microvascular ischemic disease without superimposed acute intracranial process.  Comprehensive metabolic panel shows potassium to be slightly low at 3.3.  Oral potassium was given.  The creatinine was elevated at 1.32, otherwise the comprehensive metabolic panel is within normal limits.  The troponin is less than 0.03.  No evidence of an acute event.  The electrocardiogram shows a normal sinus rhythm.  No life-threatening arrhythmias, and no evidence of an acute STEMI. The complete blood count is nonacute.  We will discuss case with the triad hospitalist concerning transient ischemic  attack. When discussing the case with the patient, the patient states that he wants to go home.  He does not want to be admitted to the hospital.  I discussed with him the possible dangers of going home having had the symptoms that he talks about, as well as experiencing this following having had a stroke in the past.  The patient states that a would like to go home and return to the emergency department if any of his symptoms return.  Dr. Pilar Plate talked with the patient and also discussed the need to stay, but the patient states he wants to go home.  He does request to have a case management consult.  He says that he is basically homeless, he is staying with a friend for now, and has not had means to seek a primary physician.  Case management called and they came down to the room to see the patient.   Final diagnoses:  TIA (transient ischemic attack)    ED Discharge Orders    None       Ivery Quale, PA-C 02/28/19 1634    Sabas Sous, MD 03/01/19 1540

## 2019-02-28 NOTE — Progress Notes (Signed)
Consult request has been received. CSW attempting to follow up at present time.   Calliope Delangel Dwaine Gale, MSW  Clinical Social Worker [phone]

## 2019-02-28 NOTE — ED Notes (Addendum)
Pt with right sided weakness worse this morning, pt with right sided weakness from previous CVA 2 weeks ago.  Also with HA earlier but denies at present.

## 2019-02-28 NOTE — ED Triage Notes (Signed)
Pt states that he been having weakness on his right side for 2 weeks. States that his right hand is tingling. He had a headache earlier this morning gone now'

## 2019-02-28 NOTE — ED Notes (Signed)
Patient transported to CT 

## 2019-03-28 ENCOUNTER — Other Ambulatory Visit: Payer: Self-pay

## 2019-03-28 ENCOUNTER — Other Ambulatory Visit: Payer: Self-pay | Admitting: Physician Assistant

## 2019-03-28 ENCOUNTER — Ambulatory Visit: Payer: Self-pay | Admitting: Physician Assistant

## 2019-03-28 ENCOUNTER — Encounter: Payer: Self-pay | Admitting: Physician Assistant

## 2019-03-28 VITALS — BP 134/86 | HR 96 | Temp 98.2°F

## 2019-03-28 DIAGNOSIS — E669 Obesity, unspecified: Secondary | ICD-10-CM

## 2019-03-28 DIAGNOSIS — Z8673 Personal history of transient ischemic attack (TIA), and cerebral infarction without residual deficits: Secondary | ICD-10-CM

## 2019-03-28 DIAGNOSIS — Z1211 Encounter for screening for malignant neoplasm of colon: Secondary | ICD-10-CM

## 2019-03-28 DIAGNOSIS — F17200 Nicotine dependence, unspecified, uncomplicated: Secondary | ICD-10-CM

## 2019-03-28 DIAGNOSIS — I10 Essential (primary) hypertension: Secondary | ICD-10-CM | POA: Insufficient documentation

## 2019-03-28 DIAGNOSIS — Z7689 Persons encountering health services in other specified circumstances: Secondary | ICD-10-CM

## 2019-03-28 MED ORDER — METOPROLOL TARTRATE 50 MG PO TABS: 50.0000 mg | ORAL_TABLET | Freq: Two times a day (BID) | ORAL | 0 refills | Status: DC

## 2019-03-28 MED ORDER — AMLODIPINE BESYLATE 10 MG PO TABS: 10.0000 mg | ORAL_TABLET | Freq: Every day | ORAL | 0 refills | Status: DC

## 2019-03-28 MED ORDER — LISINOPRIL 10 MG PO TABS: 10.0000 mg | ORAL_TABLET | Freq: Every day | ORAL | 0 refills | Status: DC

## 2019-03-28 NOTE — Progress Notes (Signed)
BP 134/86   Pulse 96   Temp 98.2 F (36.8 C)   SpO2 94%    Subjective:    [name]: Isaiah Ellis, male    DOB: [birth year], 58 y.o.   MRN: [id number]  HPI: Isaiah Ellis is a 58 y.o. male presenting on 03/28/2019 for New Patient (Initial Visit) (pt did not bring his medications and does not know the names of his medicines. pt is taking 3 different hypertensive medications given to him frim when he was at [facility] for his stroke. pt got his medications filled at [facility]. pt has been without a PCP for about 5 years pt has been going to ER when needed.)   HPI    Chief Complaint  Patient presents with  . New Patient (Initial Visit)    pt did not bring his medications and does not know the names of his medicines. pt is taking 3 different hypertensive medications given to him frim when he was at [facility] for his stroke. pt got his medications filled at [facility]. pt has been without a PCP for about 5 years pt has been going to ER when needed.    Pt reports history of stroke in 2005 (in [place]).  Pt reports he had another stroke last month.  ER report states TIA but pt left hospital AMA; ER staff wanted to admit pt but he says he was scared of the virus.   Pt says he still has right side tingling that remains from this most recent stroke.  He says he had no residual problems from stroke in 2005.  He is using a cane today.  Pt says he is feeling good.  When asked about his meds, he says he takes aspirin sometimes tid and some days none at all.  He says he uses it for pain.   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  He is not working at this time  Relevant past medical, surgical, family and social history reviewed and updated as indicated. Interim medical history since our last visit reviewed. Allergies and medications reviewed and updated.   Current Outpatient Medications:  .  aspirin 325 MG tablet, Take 325 mg by  mouth as needed. , Disp: , Rfl:  .  ibuprofen (ADVIL,MOTRIN) 200 MG tablet, Take 800 mg by mouth every 6 (six) hours as needed. Pain., Disp: , Rfl:  .  amLODipine (NORVASC) 5 MG tablet, Take 1 tablet (5 mg total) by mouth daily., Disp: 30 tablet, Rfl: 1 .  hydrochlorothiazide (HYDRODIURIL) 25 MG tablet, Take 1 tablet (25 mg total) by mouth daily., Disp: 30 tablet, Rfl: 1 .  lisinopril (PRINIVIL,ZESTRIL) 10 MG tablet, Take 1 tablet (10 mg total) by mouth daily., Disp: 30 tablet, Rfl: 1  Review of Systems  Per HPI unless specifically indicated above     Objective:    BP 134/86   Pulse 96   Temp 98.2 F (36.8 C)   SpO2 94%   Wt Readings from Last 3 Encounters:  02/28/19 300 lb (136.1 kg)  02/13/19 300 lb (136.1 kg)  11/21/12 300 lb (136.1 kg)    Physical Exam Vitals signs reviewed.  Constitutional:      General: He is not in acute distress.    Appearance: He is well-developed. He is obese.  HENT:     Head: Normocephalic and atraumatic.     Mouth/Throat:     Pharynx: No oropharyngeal exudate.  Eyes:     Conjunctiva/sclera: Conjunctivae normal.     Pupils: Pupils are equal, round, and reactive to light.  Neck:     Musculoskeletal: Neck supple.     Thyroid: No thyromegaly.  Cardiovascular:     Rate and Rhythm: Normal rate and regular rhythm.  Pulmonary:     Effort: Pulmonary effort is normal.     Breath sounds: Normal breath sounds. No wheezing or rales.  Abdominal:     General: Bowel sounds are normal.     Palpations: Abdomen is soft. There is no mass.     Tenderness: There is no abdominal tenderness.  Musculoskeletal:     Right lower leg: No edema.     Left lower leg: No edema.  Lymphadenopathy:     Cervical: No cervical adenopathy.  Skin:    General: Skin is warm and dry.     Findings: No rash.  Neurological:     Mental Status: He is alert and oriented to person, place, and time.     Motor: No weakness.     Coordination: Coordination normal.  Psychiatric:         Attention and Perception: Attention normal.        Mood and Affect: Mood normal.        Behavior: Behavior normal. Behavior is cooperative.         Assessment & Plan:     Encounter Diagnoses  Name Primary?  . Encounter to establish care Yes  . Essential hypertension   . Obesity, unspecified classification, unspecified obesity type, unspecified whether serious comorbidity present   . History of completed stroke   . Screening for colon cancer   . Tobacco use disorder     -Pt says he already applied for [facility] -[facility] -will order carotid US, echo -pt counseled to take ASA daily to prevent stroke (not for pain) -counseled smoking cessation and discussed that stopping now may reduce his risk for another stroke -pt was given iFOBT for colon cancer screening -will increase the amldoipine, continue lisinopril 10mg  and stop the hctz due to low potassium, add low dose metoprolol.   -pt encouraged to exercise regularly -pt will follow up here in 6 weeks (he should get his new meds from [facility] in about 3 weeks from now).  Pt encouraged to contact office sooner prn

## 2019-03-28 NOTE — Patient Instructions (Signed)
Continue current medications until you get meds in the mail from [facility] and then   STOP  hctz and amlodipine 5mg  and  START  Metoprolol and amlodipine 10mg    Continue lisinopril 10mg . Continue aspirin every day to help prevent stroke   Stop smoking  Exercise daily

## 2019-04-26 ENCOUNTER — Telehealth: Payer: Self-pay | Admitting: Student

## 2019-04-26 ENCOUNTER — Other Ambulatory Visit: Payer: Self-pay | Admitting: Physician Assistant

## 2019-04-26 DIAGNOSIS — I639 Cerebral infarction, unspecified: Secondary | ICD-10-CM

## 2019-04-26 MED ORDER — METOPROLOL TARTRATE 50 MG PO TABS: 50.0000 mg | ORAL_TABLET | Freq: Two times a day (BID) | ORAL | 0 refills | Status: DC

## 2019-04-26 MED ORDER — AMLODIPINE BESYLATE 10 MG PO TABS: 10.0000 mg | ORAL_TABLET | Freq: Every day | ORAL | 0 refills | Status: DC

## 2019-04-26 MED ORDER — LISINOPRIL 10 MG PO TABS: 10.0000 mg | ORAL_TABLET | Freq: Every day | ORAL | 0 refills | Status: DC

## 2019-04-26 NOTE — Telephone Encounter (Addendum)
Nurse Mayra Reel, Case Manager with [facility] contacted LPN to notify that she spoke with the patient and he has informed her that he is out of his medications (amlodipine, lisinopril, and metoprolol).  rx for amlodipine and metoprolol were ecribed to [place] on 03-28-19. Pt has not received medications from [place] [facility] due to paperwork has not yet been submitted. Per Nurse Edwinna Areola, [facility] application will be submitted today (04-26-19).  rx lisinopril was printed on 03-28-19.  LPN called patient on 04-26-19 and left voicemail for him to call back regarding this.  PA sent rx amlodipine, lisinopril and metoprolol to [facility] in [place] for him to purchase while he waits on his medicines from [place].

## 2019-04-27 ENCOUNTER — Other Ambulatory Visit: Payer: Self-pay | Admitting: Physician Assistant

## 2019-04-27 DIAGNOSIS — I639 Cerebral infarction, unspecified: Secondary | ICD-10-CM

## 2019-04-27 DIAGNOSIS — Z13220 Encounter for screening for lipoid disorders: Secondary | ICD-10-CM

## 2019-04-27 DIAGNOSIS — Z131 Encounter for screening for diabetes mellitus: Secondary | ICD-10-CM

## 2019-04-27 DIAGNOSIS — Z125 Encounter for screening for malignant neoplasm of prostate: Secondary | ICD-10-CM

## 2019-04-27 DIAGNOSIS — I10 Essential (primary) hypertension: Secondary | ICD-10-CM

## 2019-04-28 NOTE — Telephone Encounter (Signed)
LPN spoke with patient and informed him that his medications were sent to [facility] and should receive his medication in the mail. LPN informed pt that his medications were also sent to [facility] for him to have until he receives it in the mail. LPN explained to patient that the medications are available on the $4 list at [facility]. Pt states he is unable to purchase his medications as he has no income.  Ronnald Collum, Patient Coordinator contacted Nurse Edwinna Areola to notify her of patient not being able to purchase his medications. Per Lance Morin, Nurse Edwinna Areola states that his medication can be covered by [facility].  LPN called [facility] on 04-28-19 to have prescriptions transferred from [facility] to [facility].  Pt was notified that his medication have been transferred to [facility] and will be covered by the [facility] program. Pt verbalized understanding and states he will pick his medications up on tomorrow, 04-29-19.

## 2019-04-29 ENCOUNTER — Ambulatory Visit (HOSPITAL_COMMUNITY): Payer: Self-pay

## 2019-05-03 ENCOUNTER — Other Ambulatory Visit: Payer: Self-pay

## 2019-05-03 ENCOUNTER — Ambulatory Visit (HOSPITAL_COMMUNITY)
Admission: RE | Admit: 2019-05-03 | Discharge: 2019-05-03 | Disposition: A | Discharge status: Home / Self Care | Payer: Self-pay | Source: Ambulatory Visit | Attending: Physician Assistant | Admitting: Physician Assistant

## 2019-05-03 DIAGNOSIS — I639 Cerebral infarction, unspecified: Secondary | ICD-10-CM | POA: Insufficient documentation

## 2019-05-03 NOTE — Progress Notes (Signed)
*  PRELIMINARY RESULTS* Echocardiogram 2D Echocardiogram has been performed.  Samuel Germany 05/03/2019, 2:11 PM

## 2019-05-06 ENCOUNTER — Other Ambulatory Visit (HOSPITAL_COMMUNITY)
Admission: RE | Admit: 2019-05-06 | Discharge: 2019-05-06 | Disposition: A | Discharge status: Home / Self Care | Payer: Self-pay | Source: Ambulatory Visit | Attending: Physician Assistant | Admitting: Physician Assistant

## 2019-05-06 ENCOUNTER — Ambulatory Visit (HOSPITAL_COMMUNITY)
Admission: RE | Admit: 2019-05-06 | Discharge: 2019-05-06 | Disposition: A | Discharge status: Home / Self Care | Payer: Self-pay | Source: Ambulatory Visit | Attending: Physician Assistant | Admitting: Physician Assistant

## 2019-05-06 ENCOUNTER — Other Ambulatory Visit: Payer: Self-pay

## 2019-05-06 DIAGNOSIS — Z13220 Encounter for screening for lipoid disorders: Secondary | ICD-10-CM | POA: Insufficient documentation

## 2019-05-06 DIAGNOSIS — Z125 Encounter for screening for malignant neoplasm of prostate: Secondary | ICD-10-CM | POA: Insufficient documentation

## 2019-05-06 DIAGNOSIS — Z131 Encounter for screening for diabetes mellitus: Secondary | ICD-10-CM | POA: Insufficient documentation

## 2019-05-06 DIAGNOSIS — I10 Essential (primary) hypertension: Secondary | ICD-10-CM | POA: Insufficient documentation

## 2019-05-06 DIAGNOSIS — I639 Cerebral infarction, unspecified: Secondary | ICD-10-CM | POA: Insufficient documentation

## 2019-05-06 LAB — COMPREHENSIVE METABOLIC PANEL
ALT: 18 U/L (ref 0–44)
AST: 19 U/L (ref 15–41)
Albumin: 4 g/dL (ref 3.5–5.0)
Alkaline Phosphatase: 87 U/L (ref 38–126)
Anion gap: 11 (ref 5–15)
BUN: 11 mg/dL (ref 6–20)
CO2: 22 mmol/L (ref 22–32)
Calcium: 9.5 mg/dL (ref 8.9–10.3)
Chloride: 107 mmol/L (ref 98–111)
Creatinine, Ser: 1.16 mg/dL (ref 0.61–1.24)
GFR calc Af Amer: >60 mL/min (ref >60)
GFR calc non Af Amer: >60 mL/min (ref >60)
Glucose, Bld: 87 mg/dL (ref 70–99)
Potassium: 4 mmol/L (ref 3.5–5.1)
Sodium: 140 mmol/L (ref 135–145)
Total Bilirubin: 0.5 mg/dL (ref 0.3–1.2)
Total Protein: 7.5 g/dL (ref 6.5–8.1)

## 2019-05-06 LAB — HEMOGLOBIN A1C
Hgb A1c MFr Bld: 6.5 % — ABNORMAL HIGH (ref 4.8–5.6)
Mean Plasma Glucose: 139.85 mg/dL

## 2019-05-06 LAB — LIPID PANEL
Cholesterol: 217 mg/dL — ABNORMAL HIGH (ref 0–200)
HDL: 46 mg/dL (ref >40)
LDL Cholesterol: 135 mg/dL — ABNORMAL HIGH (ref 0–99)
Total CHOL/HDL Ratio: 4.7 RATIO
Triglycerides: 179 mg/dL — ABNORMAL HIGH (ref <150)
VLDL: 36 mg/dL (ref 0–40)

## 2019-05-06 LAB — PSA: Prostatic Specific Antigen: 0.68 ng/mL (ref 0.00–4.00)

## 2019-05-09 ENCOUNTER — Ambulatory Visit: Payer: Self-pay | Admitting: Physician Assistant

## 2019-05-09 ENCOUNTER — Other Ambulatory Visit: Payer: Self-pay

## 2019-05-09 ENCOUNTER — Encounter: Payer: Self-pay | Admitting: Physician Assistant

## 2019-05-09 VITALS — BP 140/86 | HR 76 | Temp 98.1°F

## 2019-05-09 DIAGNOSIS — F17200 Nicotine dependence, unspecified, uncomplicated: Secondary | ICD-10-CM

## 2019-05-09 DIAGNOSIS — E669 Obesity, unspecified: Secondary | ICD-10-CM

## 2019-05-09 DIAGNOSIS — Z8673 Personal history of transient ischemic attack (TIA), and cerebral infarction without residual deficits: Secondary | ICD-10-CM

## 2019-05-09 DIAGNOSIS — I10 Essential (primary) hypertension: Secondary | ICD-10-CM

## 2019-05-09 DIAGNOSIS — E119 Type 2 diabetes mellitus without complications: Secondary | ICD-10-CM

## 2019-05-09 DIAGNOSIS — E785 Hyperlipidemia, unspecified: Secondary | ICD-10-CM

## 2019-05-09 MED ORDER — LISINOPRIL 20 MG PO TABS: 20.0000 mg | ORAL_TABLET | Freq: Every day | ORAL | 1 refills | Status: DC

## 2019-05-09 MED ORDER — METOPROLOL TARTRATE 50 MG PO TABS: 50.0000 mg | ORAL_TABLET | Freq: Two times a day (BID) | ORAL | 0 refills | Status: DC

## 2019-05-09 MED ORDER — ATORVASTATIN CALCIUM 20 MG PO TABS: 20.0000 mg | ORAL_TABLET | Freq: Every day | ORAL | 1 refills | Status: DC

## 2019-05-09 MED ORDER — AMLODIPINE BESYLATE 10 MG PO TABS: 10.0000 mg | ORAL_TABLET | Freq: Every day | ORAL | 0 refills | Status: DC

## 2019-05-09 MED ORDER — METFORMIN HCL 500 MG PO TABS: 500.0000 mg | ORAL_TABLET | Freq: Two times a day (BID) | ORAL | 1 refills | Status: DC

## 2019-05-09 MED ORDER — LISINOPRIL 20 MG PO TABS: 20.0000 mg | ORAL_TABLET | Freq: Every day | ORAL | 3 refills | Status: DC

## 2019-05-09 NOTE — Patient Instructions (Addendum)
Diabetes Mellitus and Nutrition, Adult When you have diabetes (diabetes mellitus), it is very important to have healthy eating habits because your blood sugar (glucose) levels are greatly affected by what you eat and drink. Eating healthy foods in the appropriate amounts, at about the same times every day, can help you:  Control your blood glucose.  Lower your risk of heart disease.  Improve your blood pressure.  Reach or maintain a healthy weight. Every person with diabetes is different, and each person has different needs for a meal plan. Your health care provider may recommend that you work with a diet and nutrition specialist (dietitian) to make a meal plan that is best for you. Your meal plan may vary depending on factors such as:  The calories you need.  The medicines you take.  Your weight.  Your blood glucose, blood pressure, and cholesterol levels.  Your activity level.  Other health conditions you have, such as heart or kidney disease. How do carbohydrates affect me? Carbohydrates, also called carbs, affect your blood glucose level more than any other type of food. Eating carbs naturally raises the amount of glucose in your blood. Carb counting is a method for keeping track of how many carbs you eat. Counting carbs is important to keep your blood glucose at a healthy level, especially if you use insulin or take certain oral diabetes medicines. It is important to know how many carbs you can safely have in each meal. This is different for every person. Your dietitian can help you calculate how many carbs you should have at each meal and for each snack. Foods that contain carbs include:  Bread, cereal, rice, pasta, and crackers.  Potatoes and corn.  Peas, beans, and lentils.  Milk and yogurt.  Fruit and juice.  Desserts, such as cakes, cookies, ice cream, and candy. How does alcohol affect me? Alcohol can cause a sudden decrease in blood glucose (hypoglycemia),  especially if you use insulin or take certain oral diabetes medicines. Hypoglycemia can be a life-threatening condition. Symptoms of hypoglycemia (sleepiness, dizziness, and confusion) are similar to symptoms of having too much alcohol. If your health care provider says that alcohol is safe for you, follow these guidelines:  Limit alcohol intake to no more than 1 drink per day for nonpregnant women and 2 drinks per day for men. One drink equals 12 oz of beer, 5 oz of wine, or 1 oz of hard liquor.  Do not drink on an empty stomach.  Keep yourself hydrated with water, diet soda, or unsweetened iced tea.  Keep in mind that regular soda, juice, and other mixers may contain a lot of sugar and must be counted as carbs. What are tips for following this plan?  Reading food labels  Start by checking the serving size on the "Nutrition Facts" label of packaged foods and drinks. The amount of calories, carbs, fats, and other nutrients listed on the label is based on one serving of the item. Many items contain more than one serving per package.  Check the total grams (g) of carbs in one serving. You can calculate the number of servings of carbs in one serving by dividing the total carbs by 15. For example, if a food has 30 g of total carbs, it would be equal to 2 servings of carbs.  Check the number of grams (g) of saturated and trans fats in one serving. Choose foods that have low or no amount of these fats.  Check the number of   milligrams (mg) of salt (sodium) in one serving. Most people should limit total sodium intake to less than 2,300 mg per day.  Always check the nutrition information of foods labeled as "low-fat" or "nonfat". These foods may be higher in added sugar or refined carbs and should be avoided.  Talk to your dietitian to identify your daily goals for nutrients listed on the label. Shopping  Avoid buying canned, premade, or processed foods. These foods tend to be high in fat, sodium,  and added sugar.  Shop around the outside edge of the grocery store. This includes fresh fruits and vegetables, bulk grains, fresh meats, and fresh dairy. Cooking  Use low-heat cooking methods, such as baking, instead of high-heat cooking methods like deep frying.  Cook using healthy oils, such as olive, canola, or sunflower oil.  Avoid cooking with butter, cream, or high-fat meats. Meal planning  Eat meals and snacks regularly, preferably at the same times every day. Avoid going long periods of time without eating.  Eat foods high in fiber, such as fresh fruits, vegetables, beans, and whole grains. Talk to your dietitian about how many servings of carbs you can eat at each meal.  Eat 4-6 ounces (oz) of lean protein each day, such as lean meat, chicken, fish, eggs, or tofu. One oz of lean protein is equal to: ? 1 oz of meat, chicken, or fish. ? 1 egg. ?  cup of tofu.  Eat some foods each day that contain healthy fats, such as avocado, nuts, seeds, and fish. Lifestyle  Check your blood glucose regularly.  Exercise regularly as told by your health care provider. This may include: ? 150 minutes of moderate-intensity or vigorous-intensity exercise each week. This could be brisk walking, biking, or water aerobics. ? Stretching and doing strength exercises, such as yoga or weightlifting, at least 2 times a week.  Take medicines as told by your health care provider.  Do not use any products that contain nicotine or tobacco, such as cigarettes and e-cigarettes. If you need help quitting, ask your health care provider.  Work with a Social worker or diabetes educator to identify strategies to manage stress and any emotional and social challenges. Questions to ask a health care provider  Do I need to meet with a diabetes educator?  Do I need to meet with a dietitian?  What number can I call if I have questions?  When are the best times to check my blood glucose? Where to find more  information:  American Diabetes Association: diabetes.org  Academy of Nutrition and Dietetics: www.eatright.[employer] of Diabetes and Digestive and Kidney Diseases (NIH): [employer] Summary  A healthy meal plan will help you control your blood glucose and maintain a healthy lifestyle.  Working with a diet and nutrition specialist (dietitian) can help you make a meal plan that is best for you.  Keep in mind that carbohydrates (carbs) and alcohol have immediate effects on your blood glucose levels. It is important to count carbs and to use alcohol carefully. This information is not intended to replace advice given to you by your health care provider. Make sure you discuss any questions you have with your health care provider. Document Released: 07/31/2005 Document Revised: 06/03/2017 Document Reviewed: 12/08/2016 Elsevier Interactive Patient Education  2019 [employer].  -----------------------------------------   Cholesterol Cholesterol is a white, waxy, fat-like substance that is needed by the human body in small amounts. The liver makes all the cholesterol we need. Cholesterol is carried from the  liver by the blood through the blood vessels. Deposits of cholesterol (plaques) may build up on blood vessel (artery) walls. Plaques make the arteries narrower and stiffer. Cholesterol plaques increase the risk for heart attack and stroke. You cannot feel your cholesterol level even if it is very high. The only way to know that it is high is to have a blood test. Once you know your cholesterol levels, you should keep a record of the test results. Work with your health care provider to keep your levels in the desired range. What do the results mean?  Total cholesterol is a rough measure of all the cholesterol in your blood.  LDL (low-density lipoprotein) is the "bad" cholesterol. This is the type that causes plaque to build up on the artery  walls. You want this level to be low.  HDL (high-density lipoprotein) is the "good" cholesterol because it cleans the arteries and carries the LDL away. You want this level to be high.  Triglycerides are fat that the body can either burn for energy or store. High levels are closely linked to heart disease. What are the desired levels of cholesterol?  Total cholesterol below 200.  LDL below 100 for people who are at risk, below 70 for people at very high risk.  HDL above 40 is good. A level of 60 or higher is considered to be protective against heart disease.  Triglycerides below 150. How can I lower my cholesterol? Diet Follow your diet program as told by your health care provider.  Choose fish or white meat chicken and turkey, roasted or baked. Limit fatty cuts of red meat, fried foods, and processed meats, such as sausage and lunch meats.  Eat lots of fresh fruits and vegetables.  Choose whole grains, beans, pasta, potatoes, and cereals.  Choose olive oil, corn oil, or canola oil, and use only small amounts.  Avoid butter, mayonnaise, shortening, or palm kernel oils.  Avoid foods with trans fats.  Drink skim or nonfat milk and eat low-fat or nonfat yogurt and cheeses. Avoid whole milk, cream, ice cream, egg yolks, and full-fat cheeses.  Healthier desserts include angel food cake, ginger snaps, animal crackers, hard candy, popsicles, and low-fat or nonfat frozen yogurt. Avoid pastries, cakes, pies, and cookies.  Exercise  Follow your exercise program as told by your health care provider. A regular program: ? Helps to decrease LDL and raise HDL. ? Helps with weight control.  Do things that increase your activity level, such as gardening, walking, and taking the stairs.  Ask your health care provider about ways that you can be more active in your daily life. Medicine  Take over-the-counter and prescription medicines only as told by your health care provider. ? Medicine  may be prescribed by your health care provider to help lower cholesterol and decrease the risk for heart disease. This is usually done if diet and exercise have failed to bring down cholesterol levels. ? If you have several risk factors, you may need medicine even if your levels are normal. This information is not intended to replace advice given to you by your health care provider. Make sure you discuss any questions you have with your health care provider. Document Released: 07/29/2001 Document Revised: 05/31/2016 Document Reviewed: 05/03/2016 Elsevier Interactive Patient Education  2019 Elsevier Inc.  

## 2019-05-09 NOTE — Progress Notes (Signed)
BP 140/86   Pulse 76   Temp 98.1 F (36.7 C)   SpO2 97%    Subjective:    [name]: Isaiah Ellis, male    DOB: 30-Mar-1961, 58 y.o.   MRN: [id number]  HPI: Isaiah Ellis is a 58 y.o. male presenting on 05/09/2019 for Hypertension   HPI   Pt had negative screeningquestionnairefor CV19   He is still smoking  He is feeling well and has no complaints  Relevant past medical, surgical, family and social history reviewed and updated as indicated. Interim medical history since our last visit reviewed. Allergies and medications reviewed and updated.   Current Outpatient Medications:  .  amLODipine (NORVASC) 10 MG tablet, Take 1 tablet (10 mg total) by mouth daily., Disp: 30 tablet, Rfl: 0 .  aspirin 325 MG tablet, Take 325 mg by mouth as needed. , Disp: , Rfl:  .  ibuprofen (ADVIL,MOTRIN) 200 MG tablet, Take 800 mg by mouth every 6 (six) hours as needed. Pain., Disp: , Rfl:  .  lisinopril (ZESTRIL) 10 MG tablet, Take 1 tablet (10 mg total) by mouth daily., Disp: 90 tablet, Rfl: 0 .  metoprolol tartrate (LOPRESSOR) 50 MG tablet, Take 1 tablet (50 mg total) by mouth 2 (two) times daily., Disp: 60 tablet, Rfl: 0   Review of Systems  Per HPI unless specifically indicated above     Objective:    BP 140/86   Pulse 76   Temp 98.1 F (36.7 C)   SpO2 97%   Wt Readings from Last 3 Encounters:  02/28/19 300 lb (136.1 kg)  02/13/19 300 lb (136.1 kg)  11/21/12 300 lb (136.1 kg)    Physical Exam Vitals signs reviewed.  Constitutional:      Appearance: He is well-developed.  HENT:     Head: Normocephalic and atraumatic.  Neck:     Musculoskeletal: Neck supple.  Cardiovascular:     Rate and Rhythm: Normal rate and regular rhythm.  Pulmonary:     Effort: Pulmonary effort is normal.     Breath sounds: Normal breath sounds. No wheezing.  Abdominal:     General: Bowel sounds are normal.     Palpations: Abdomen is soft.     Tenderness: There is no abdominal tenderness.   Musculoskeletal:     Right lower leg: No edema.     Left lower leg: No edema.  Lymphadenopathy:     Cervical: No cervical adenopathy.  Skin:    General: Skin is warm and dry.  Neurological:     Mental Status: He is alert and oriented to person, place, and time.  Psychiatric:        Behavior: Behavior normal.     Results for orders placed or performed during the hospital encounter of 05/06/19  Hemoglobin A1c  Result Value Ref Range   Hgb A1c MFr Bld 6.5 (H) 4.8 - 5.6 %   Mean Plasma Glucose 139.85 mg/dL  PSA  Result Value Ref Range   Prostatic Specific Antigen 0.68 0.00 - 4.00 ng/mL  Lipid panel  Result Value Ref Range   Cholesterol 217 (H) 0 - 200 mg/dL   Triglycerides 179 (H) <150 mg/dL   HDL 46 >40 mg/dL   Total CHOL/HDL Ratio 4.7 RATIO   VLDL 36 0 - 40 mg/dL   LDL Cholesterol 135 (H) 0 - 99 mg/dL  Comprehensive metabolic panel  Result Value Ref Range   Sodium 140 135 - 145 mmol/L   Potassium 4.0 3.5 -  5.1 mmol/L   Chloride 107 98 - 111 mmol/L   CO2 22 22 - 32 mmol/L   Glucose, Bld 87 70 - 99 mg/dL   BUN 11 6 - 20 mg/dL   Creatinine, Ser 8.651.16 0.61 - 1.24 mg/dL   Calcium 9.5 8.9 - 78.410.3 mg/dL   Total Protein 7.5 6.5 - 8.1 g/dL   Albumin 4.0 3.5 - 5.0 g/dL   AST 19 15 - 41 U/L   ALT 18 0 - 44 U/L   Alkaline Phosphatase 87 38 - 126 U/L   Total Bilirubin 0.5 0.3 - 1.2 mg/dL   GFR calc non Af Amer >60 >60 mL/min   GFR calc Af Amer >60 >60 mL/min   Anion gap 11 5 - 15      Assessment & Plan:   Encounter Diagnoses  Name Primary?  . Essential hypertension Yes  . Diabetes mellitus without complication (HCC)   . Hyperlipidemia, unspecified hyperlipidemia type   . Obesity, unspecified classification, unspecified obesity type, unspecified whether serious comorbidity present   . Tobacco use disorder   . History of completed stroke      -reviewed labs with pt -pt is signed up for medassist -counseled pt on diabetes and gave reading information -will Add  metformin and atorvastatin -will Increase lisinopril to 20mg . Pt to Continue amlodipine and metoprolol -encouraged smoking cessation -encouraged regular exercise to help htn, DM, lipids, joints -encouraged pt to wear face mask when in public per CDC guidelines -pt to follow up in 6 weeks.  He is to contact office sooner prn

## 2019-05-10 LAB — IFOBT (OCCULT BLOOD): IFOBT: NEGATIVE

## 2019-05-18 DIAGNOSIS — E119 Type 2 diabetes mellitus without complications: Secondary | ICD-10-CM | POA: Insufficient documentation

## 2019-05-18 DIAGNOSIS — E785 Hyperlipidemia, unspecified: Secondary | ICD-10-CM | POA: Insufficient documentation

## 2019-05-18 DIAGNOSIS — F17200 Nicotine dependence, unspecified, uncomplicated: Secondary | ICD-10-CM | POA: Insufficient documentation

## 2019-05-18 DIAGNOSIS — E1169 Type 2 diabetes mellitus with other specified complication: Secondary | ICD-10-CM | POA: Insufficient documentation

## 2019-06-20 ENCOUNTER — Ambulatory Visit: Payer: Self-pay | Admitting: Physician Assistant

## 2019-06-20 ENCOUNTER — Other Ambulatory Visit: Payer: Self-pay

## 2019-06-20 ENCOUNTER — Encounter: Payer: Self-pay | Admitting: Physician Assistant

## 2019-06-20 VITALS — BP 110/80 | HR 67 | Temp 97.7°F

## 2019-06-20 DIAGNOSIS — F17200 Nicotine dependence, unspecified, uncomplicated: Secondary | ICD-10-CM

## 2019-06-20 DIAGNOSIS — E669 Obesity, unspecified: Secondary | ICD-10-CM

## 2019-06-20 DIAGNOSIS — E119 Type 2 diabetes mellitus without complications: Secondary | ICD-10-CM

## 2019-06-20 DIAGNOSIS — I10 Essential (primary) hypertension: Secondary | ICD-10-CM

## 2019-06-20 DIAGNOSIS — E785 Hyperlipidemia, unspecified: Secondary | ICD-10-CM

## 2019-06-20 NOTE — Progress Notes (Signed)
BP 110/80   Pulse 67   Temp 97.7 F (36.5 C)   SpO2 98%    Subjective:    [name]: Isaiah Ellis, male    DOB: 05/09/1961, 58 y.o.   MRN: [id number]  HPI: Isaiah EhrichGeorge H Augspurger is a 58 y.o. male presenting on 06/20/2019 for Hypertension and Diabetes   HPI   Pt had a negative covid 19 screening questionnaire    Pt was seen 6 wk ago and was started on metformin and atorvastatin and his lisinopril was increased.  He says he is doing well.  He is still smoking but has cut back a lot and is hoping to quit soon.  He has cut out sugar and he is still walking for exercise.  He says he has already lost a few pounds and hopes to continue working towards a healthy lifestyle.    Relevant past medical, surgical, family and social history reviewed and updated as indicated. Interim medical history since our last visit reviewed. Allergies and medications reviewed and updated.    Current Outpatient Medications:  .  amLODipine (NORVASC) 10 MG tablet, Take 1 tablet (10 mg total) by mouth daily., Disp: 90 tablet, Rfl: 0 .  aspirin 325 MG tablet, Take 325 mg by mouth as needed. , Disp: , Rfl:  .  atorvastatin (LIPITOR) 20 MG tablet, Take 1 tablet (20 mg total) by mouth daily., Disp: 90 tablet, Rfl: 1 .  ibuprofen (ADVIL,MOTRIN) 200 MG tablet, Take 800 mg by mouth every 6 (six) hours as needed. Pain., Disp: , Rfl:  .  lisinopril (ZESTRIL) 20 MG tablet, Take 1 tablet (20 mg total) by mouth daily., Disp: 90 tablet, Rfl: 1 .  metFORMIN (GLUCOPHAGE) 500 MG tablet, Take 1 tablet (500 mg total) by mouth 2 (two) times daily with a meal., Disp: 180 tablet, Rfl: 1 .  metoprolol tartrate (LOPRESSOR) 50 MG tablet, Take 1 tablet (50 mg total) by mouth 2 (two) times daily., Disp: 180 tablet, Rfl: 0   Review of Systems  Per HPI unless specifically indicated above     Objective:    BP 110/80   Pulse 67   Temp 97.7 F (36.5 C)   SpO2 98%   Wt Readings from Last 3 Encounters:  02/28/19 300 lb (136.1 kg)   02/13/19 300 lb (136.1 kg)  11/21/12 300 lb (136.1 kg)    Physical Exam Vitals signs reviewed.  Constitutional:      General: He is not in acute distress.    Appearance: Normal appearance. He is well-developed. He is obese. He is not ill-appearing.  HENT:     Head: Normocephalic and atraumatic.  Neck:     Musculoskeletal: Neck supple.  Cardiovascular:     Rate and Rhythm: Normal rate and regular rhythm.  Pulmonary:     Effort: Pulmonary effort is normal.     Breath sounds: Normal breath sounds. No wheezing.  Abdominal:     General: Bowel sounds are normal.     Palpations: Abdomen is soft.     Tenderness: There is no abdominal tenderness.  Musculoskeletal:     Right lower leg: No edema.     Left lower leg: No edema.  Lymphadenopathy:     Cervical: No cervical adenopathy.  Skin:    General: Skin is warm and dry.  Neurological:     Mental Status: He is alert and oriented to person, place, and time.  Psychiatric:        Attention and Perception: Attention  normal.        Speech: Speech normal.        Behavior: Behavior normal. Behavior is cooperative.         Assessment & Plan:   Encounter Diagnoses  Name Primary?  . Essential hypertension Yes  . Diabetes mellitus without complication ([place])   . Hyperlipidemia, unspecified hyperlipidemia type   . Obesity, unspecified classification, unspecified obesity type, unspecified whether serious comorbidity present   . Tobacco use disorder       -BP is excellent/much improved -pt to continue current medications -encouraged pt to continue working towards a healthy lifestyle -follow up in 2 months with telemedicine appointment.   Pt to contact office sooner prn

## 2019-08-23 ENCOUNTER — Ambulatory Visit: Payer: Self-pay | Admitting: Physician Assistant

## 2019-08-23 ENCOUNTER — Other Ambulatory Visit: Payer: Self-pay | Admitting: Physician Assistant

## 2019-08-25 ENCOUNTER — Other Ambulatory Visit: Payer: Self-pay | Admitting: Physician Assistant

## 2019-08-29 ENCOUNTER — Other Ambulatory Visit: Payer: Self-pay

## 2019-08-29 ENCOUNTER — Other Ambulatory Visit (HOSPITAL_COMMUNITY)
Admission: RE | Admit: 2019-08-29 | Discharge: 2019-08-29 | Disposition: A | Discharge status: Home / Self Care | Payer: Self-pay | Source: Ambulatory Visit | Attending: Physician Assistant | Admitting: Physician Assistant

## 2019-08-29 DIAGNOSIS — E785 Hyperlipidemia, unspecified: Secondary | ICD-10-CM | POA: Insufficient documentation

## 2019-08-29 DIAGNOSIS — E119 Type 2 diabetes mellitus without complications: Secondary | ICD-10-CM | POA: Insufficient documentation

## 2019-08-29 DIAGNOSIS — I10 Essential (primary) hypertension: Secondary | ICD-10-CM | POA: Insufficient documentation

## 2019-08-29 LAB — LIPID PANEL
Cholesterol: 138 mg/dL (ref 0–200)
HDL: 48 mg/dL (ref >40)
LDL Cholesterol: 72 mg/dL (ref 0–99)
Total CHOL/HDL Ratio: 2.9 RATIO
Triglycerides: 91 mg/dL (ref <150)
VLDL: 18 mg/dL (ref 0–40)

## 2019-08-29 LAB — COMPREHENSIVE METABOLIC PANEL
ALT: 19 U/L (ref 0–44)
AST: 19 U/L (ref 15–41)
Albumin: 3.9 g/dL (ref 3.5–5.0)
Alkaline Phosphatase: 87 U/L (ref 38–126)
Anion gap: 8 (ref 5–15)
BUN: 12 mg/dL (ref 6–20)
CO2: 23 mmol/L (ref 22–32)
Calcium: 9.2 mg/dL (ref 8.9–10.3)
Chloride: 109 mmol/L (ref 98–111)
Creatinine, Ser: 1.21 mg/dL (ref 0.61–1.24)
GFR calc Af Amer: >60 mL/min (ref >60)
GFR calc non Af Amer: >60 mL/min (ref >60)
Glucose, Bld: 85 mg/dL (ref 70–99)
Potassium: 4 mmol/L (ref 3.5–5.1)
Sodium: 140 mmol/L (ref 135–145)
Total Bilirubin: 0.4 mg/dL (ref 0.3–1.2)
Total Protein: 7.4 g/dL (ref 6.5–8.1)

## 2019-08-29 LAB — HEMOGLOBIN A1C
Hgb A1c MFr Bld: 6.3 % — ABNORMAL HIGH (ref 4.8–5.6)
Mean Plasma Glucose: 134.11 mg/dL

## 2019-08-30 LAB — MICROALBUMIN, URINE: Microalb, Ur: 25.9 ug/mL — ABNORMAL HIGH

## 2019-09-08 ENCOUNTER — Encounter: Payer: Self-pay | Admitting: Physician Assistant

## 2019-09-08 ENCOUNTER — Ambulatory Visit: Payer: Self-pay | Admitting: Physician Assistant

## 2019-09-08 DIAGNOSIS — E119 Type 2 diabetes mellitus without complications: Secondary | ICD-10-CM

## 2019-09-08 DIAGNOSIS — I10 Essential (primary) hypertension: Secondary | ICD-10-CM

## 2019-09-08 DIAGNOSIS — E785 Hyperlipidemia, unspecified: Secondary | ICD-10-CM

## 2019-09-08 DIAGNOSIS — F17200 Nicotine dependence, unspecified, uncomplicated: Secondary | ICD-10-CM

## 2019-09-08 DIAGNOSIS — E669 Obesity, unspecified: Secondary | ICD-10-CM

## 2019-09-08 NOTE — Progress Notes (Signed)
There were no vitals taken for this visit.   Subjective:    [name]: Isaiah Ellis, male    DOB: 05-31-1961, 58 y.o.   MRN: [id number]  HPI: Isaiah Ellis is a 58 y.o. male presenting on 09/08/2019 for No chief complaint on file.   HPI   This is a telemedicine appointemnt through [employer] due to coronavirus pandemic.  I connected with  Kirstie Mirza on 09/08/19 by a video enabled telemedicine application and verified that I am speaking with the correct person using two identifiers.   I discussed the limitations of evaluation and management by telemedicine. The patient expressed understanding and agreed to proceed.  Pt is at home.  Provider is at office.      He says he is exercising walking the driveway. For exercise.  He does that every day.    Pt says his weight is going down.  He isn't checking his weight but says his clothes are looser  He says he is feeling well and has no complaints.   He is still smoking     Relevant past medical, surgical, family and social history reviewed and updated as indicated. Interim medical history since our last visit reviewed. Allergies and medications reviewed and updated.   Current Outpatient Medications:  .  amLODipine (NORVASC) 10 MG tablet, TAKE 1 Tablet BY MOUTH ONCE EVERY DAY, Disp: 90 tablet, Rfl: 0 .  aspirin 325 MG tablet, Take 325 mg by mouth as needed. , Disp: , Rfl:  .  atorvastatin (LIPITOR) 20 MG tablet, Take 1 tablet (20 mg total) by mouth daily., Disp: 90 tablet, Rfl: 1 .  ibuprofen (ADVIL,MOTRIN) 200 MG tablet, Take 800 mg by mouth every 6 (six) hours as needed. Pain., Disp: , Rfl:  .  lisinopril (ZESTRIL) 20 MG tablet, Take 1 tablet (20 mg total) by mouth daily., Disp: 90 tablet, Rfl: 1 .  metFORMIN (GLUCOPHAGE) 500 MG tablet, Take 1 tablet (500 mg total) by mouth 2 (two) times daily with a meal., Disp: 180 tablet, Rfl: 1 .  metoprolol tartrate (LOPRESSOR) 50 MG tablet, TAKE 1 Tablet  BY MOUTH TWICE DAILY, Disp: 180  tablet, Rfl: 0    Review of Systems  Per HPI unless specifically indicated above     Objective:    There were no vitals taken for this visit.  Wt Readings from Last 3 Encounters:  02/28/19 300 lb (136.1 kg)  02/13/19 300 lb (136.1 kg)  11/21/12 300 lb (136.1 kg)    Physical Exam Constitutional:      General: He is not in acute distress.    Appearance: He is obese. He is not ill-appearing.  HENT:     Head: Normocephalic and atraumatic.  Pulmonary:     Effort: No respiratory distress.  Neurological:     Mental Status: He is alert and oriented to person, place, and time.  Psychiatric:        Attention and Perception: Attention normal.        Speech: Speech normal.        Behavior: Behavior is cooperative.     Results for orders placed or performed during the hospital encounter of 08/29/19  Microalbumin, urine  Result Value Ref Range   Microalb, Ur 25.9 (H) Not Estab. ug/mL  Hemoglobin A1c  Result Value Ref Range   Hgb A1c MFr Bld 6.3 (H) 4.8 - 5.6 %   Mean Plasma Glucose 134.11 mg/dL  Lipid panel  Result Value Ref Range  Cholesterol 138 0 - 200 mg/dL   Triglycerides 91 <939 mg/dL   HDL 48 >03 mg/dL   Total CHOL/HDL Ratio 2.9 RATIO   VLDL 18 0 - 40 mg/dL   LDL Cholesterol 72 0 - 99 mg/dL  Comprehensive metabolic panel  Result Value Ref Range   Sodium 140 135 - 145 mmol/L   Potassium 4.0 3.5 - 5.1 mmol/L   Chloride 109 98 - 111 mmol/L   CO2 23 22 - 32 mmol/L   Glucose, Bld 85 70 - 99 mg/dL   BUN 12 6 - 20 mg/dL   Creatinine, Ser 0.09 0.61 - 1.24 mg/dL   Calcium 9.2 8.9 - 23.3 mg/dL   Total Protein 7.4 6.5 - 8.1 g/dL   Albumin 3.9 3.5 - 5.0 g/dL   AST 19 15 - 41 U/L   ALT 19 0 - 44 U/L   Alkaline Phosphatase 87 38 - 126 U/L   Total Bilirubin 0.4 0.3 - 1.2 mg/dL   GFR calc non Af Amer >60 >60 mL/min   GFR calc Af Amer >60 >60 mL/min   Anion gap 8 5 - 15      Assessment & Plan:   Encounter Diagnoses  Name Primary?  . Diabetes mellitus without  complication (HCC) Yes  . Essential hypertension   . Hyperlipidemia, unspecified hyperlipidemia type   . Obesity, unspecified classification, unspecified obesity type, unspecified whether serious comorbidity present   . Tobacco use disorder      reviewed labs with patient. all values are stable and patient is to continue his current medications.  Patient is to follow-up for appointment in 3 months. he is to contact office sooner for any problems.  patient is reminded to wear a mask per CDC guidelines to reduce risk for contracting coronavirus.  Patient is encouraged to continue his healthy diet and regular exercise.  Patient is encouraged to stop smoking.

## 2019-11-24 ENCOUNTER — Other Ambulatory Visit: Payer: Self-pay | Admitting: Physician Assistant

## 2019-12-12 ENCOUNTER — Other Ambulatory Visit (HOSPITAL_COMMUNITY)
Admission: RE | Admit: 2019-12-12 | Discharge: 2019-12-12 | Disposition: A | Discharge status: Home / Self Care | Payer: Self-pay | Source: Ambulatory Visit | Attending: Physician Assistant | Admitting: Physician Assistant

## 2019-12-12 DIAGNOSIS — E785 Hyperlipidemia, unspecified: Secondary | ICD-10-CM

## 2019-12-12 DIAGNOSIS — I10 Essential (primary) hypertension: Secondary | ICD-10-CM

## 2019-12-12 DIAGNOSIS — E119 Type 2 diabetes mellitus without complications: Secondary | ICD-10-CM

## 2019-12-12 LAB — COMPREHENSIVE METABOLIC PANEL
ALT: 20 U/L (ref 0–44)
AST: 20 U/L (ref 15–41)
Albumin: 4.1 g/dL (ref 3.5–5.0)
Alkaline Phosphatase: 83 U/L (ref 38–126)
Anion gap: 8 (ref 5–15)
BUN: 14 mg/dL (ref 6–20)
CO2: 23 mmol/L (ref 22–32)
Calcium: 9.2 mg/dL (ref 8.9–10.3)
Chloride: 107 mmol/L (ref 98–111)
Creatinine, Ser: 1.1 mg/dL (ref 0.61–1.24)
GFR calc Af Amer: >60 mL/min (ref >60)
GFR calc non Af Amer: >60 mL/min (ref >60)
Glucose, Bld: 85 mg/dL (ref 70–99)
Potassium: 4.3 mmol/L (ref 3.5–5.1)
Sodium: 138 mmol/L (ref 135–145)
Total Bilirubin: 0.8 mg/dL (ref 0.3–1.2)
Total Protein: 7.4 g/dL (ref 6.5–8.1)

## 2019-12-12 LAB — LIPID PANEL
Cholesterol: 156 mg/dL (ref 0–200)
HDL: 51 mg/dL (ref >40)
LDL Cholesterol: 87 mg/dL (ref 0–99)
Total CHOL/HDL Ratio: 3.1 RATIO
Triglycerides: 91 mg/dL (ref <150)
VLDL: 18 mg/dL (ref 0–40)

## 2019-12-12 LAB — HEMOGLOBIN A1C
Hgb A1c MFr Bld: 6.1 % — ABNORMAL HIGH (ref 4.8–5.6)
Mean Plasma Glucose: 128.37 mg/dL

## 2019-12-14 ENCOUNTER — Other Ambulatory Visit: Payer: Self-pay | Admitting: Physician Assistant

## 2019-12-14 ENCOUNTER — Other Ambulatory Visit: Payer: Self-pay

## 2019-12-14 ENCOUNTER — Encounter: Payer: Self-pay | Admitting: Physician Assistant

## 2019-12-14 ENCOUNTER — Ambulatory Visit: Payer: Self-pay | Admitting: Physician Assistant

## 2019-12-14 VITALS — BP 110/80 | HR 69 | Temp 97.9°F | Ht 73.0 in | Wt 356.0 lb

## 2019-12-14 DIAGNOSIS — E785 Hyperlipidemia, unspecified: Secondary | ICD-10-CM

## 2019-12-14 DIAGNOSIS — F17200 Nicotine dependence, unspecified, uncomplicated: Secondary | ICD-10-CM

## 2019-12-14 DIAGNOSIS — E119 Type 2 diabetes mellitus without complications: Secondary | ICD-10-CM

## 2019-12-14 DIAGNOSIS — I10 Essential (primary) hypertension: Secondary | ICD-10-CM

## 2019-12-14 MED ORDER — LISINOPRIL 20 MG PO TABS: ORAL_TABLET | ORAL | 1 refills | Status: DC

## 2019-12-14 MED ORDER — ATORVASTATIN CALCIUM 20 MG PO TABS: ORAL_TABLET | ORAL | 1 refills | Status: DC

## 2019-12-14 MED ORDER — AMLODIPINE BESYLATE 10 MG PO TABS: ORAL_TABLET | ORAL | 1 refills | Status: DC

## 2019-12-14 MED ORDER — METOPROLOL TARTRATE 50 MG PO TABS: 50.0000 mg | ORAL_TABLET | Freq: Two times a day (BID) | ORAL | 1 refills | Status: DC

## 2019-12-14 MED ORDER — METFORMIN HCL 500 MG PO TABS: ORAL_TABLET | ORAL | 1 refills | Status: DC

## 2019-12-14 NOTE — Progress Notes (Signed)
BP 110/80   Pulse 69   Temp 97.9 F (36.6 C)   Ht 6\' 1"  (1.854 m)   Wt (!) 356 lb (161.5 kg)   SpO2 93%   BMI 46.97 kg/m    Subjective:    [name]: Isaiah Ellis, male    DOB: 1961-08-16, 59 y.o.   MRN: [id number]  HPI: Isaiah Ellis is a 59 y.o. male presenting on 12/14/2019 for Hypertension, Diabetes, and Hyperlipidemia   HPI   Pt had negative covid 9 screening questionnaire   Pt is a very pleasant 30yoM with DM, htn and dyslipidemia with history of stroke.   He says he is doing very well and says that he has been trying to diet and has been trying to work on exercise and weight loss.  He says he knows that he has lost some weight.  He says his only complaint is that he is "not seeing as good since his stroke".     Relevant past medical, surgical, family and social history reviewed and updated as indicated. Interim medical history since our last visit reviewed. Allergies and medications reviewed and updated.   Current Outpatient Medications:  .  amLODipine (NORVASC) 10 MG tablet, TAKE 1 Tablet BY MOUTH ONCE EVERY DAY, Disp: 90 tablet, Rfl: 0 .  aspirin 325 MG tablet, Take 325 mg by mouth as needed. , Disp: , Rfl:  .  atorvastatin (LIPITOR) 20 MG tablet, TAKE 1 Tablet BY MOUTH ONCE EVERY DAY, Disp: 90 tablet, Rfl: 1 .  ibuprofen (ADVIL,MOTRIN) 200 MG tablet, Take 800 mg by mouth every 6 (six) hours as needed. Pain., Disp: , Rfl:  .  lisinopril (ZESTRIL) 20 MG tablet, TAKE 1 Tablet BY MOUTH ONCE EVERY DAY, Disp: 90 tablet, Rfl: 1 .  metFORMIN (GLUCOPHAGE) 500 MG tablet, TAKE 1 Tablet  BY MOUTH TWICE DAILY WITH A MEAL, Disp: 180 tablet, Rfl: 1 .  metoprolol tartrate (LOPRESSOR) 50 MG tablet, TAKE 1 Tablet  BY MOUTH TWICE DAILY, Disp: 180 tablet, Rfl: 0    Review of Systems  Per HPI unless specifically indicated above     Objective:    BP 110/80   Pulse 69   Temp 97.9 F (36.6 C)   Ht 6\' 1"  (1.854 m)   Wt (!) 356 lb (161.5 kg)   SpO2 93%   BMI 46.97 kg/m    Wt Readings from Last 3 Encounters:  12/14/19 (!) 356 lb (161.5 kg)  02/28/19 300 lb (136.1 kg)  02/13/19 300 lb (136.1 kg)    Physical Exam Vitals reviewed.  Constitutional:      General: He is not in acute distress.    Appearance: He is well-developed. He is obese. He is not ill-appearing.  HENT:     Head: Normocephalic and atraumatic.  Cardiovascular:     Rate and Rhythm: Normal rate and regular rhythm.  Pulmonary:     Effort: Pulmonary effort is normal.     Breath sounds: Normal breath sounds. No wheezing.  Abdominal:     General: Bowel sounds are normal.     Palpations: Abdomen is soft.     Tenderness: There is no abdominal tenderness.  Musculoskeletal:     Cervical back: Neck supple.     Right lower leg: No edema.     Left lower leg: No edema.  Lymphadenopathy:     Cervical: No cervical adenopathy.  Skin:    General: Skin is warm and dry.  Neurological:     Mental  Status: He is alert and oriented to person, place, and time.  Psychiatric:        Attention and Perception: Attention normal.        Speech: Speech normal.        Behavior: Behavior normal. Behavior is cooperative.        Results for orders placed or performed during the hospital encounter of 12/12/19  Lipid panel  Result Value Ref Range   Cholesterol 156 0 - 200 mg/dL   Triglycerides 91 <801 mg/dL   HDL 51 >65 mg/dL   Total CHOL/HDL Ratio 3.1 RATIO   VLDL 18 0 - 40 mg/dL   LDL Cholesterol 87 0 - 99 mg/dL  Comprehensive metabolic panel  Result Value Ref Range   Sodium 138 135 - 145 mmol/L   Potassium 4.3 3.5 - 5.1 mmol/L   Chloride 107 98 - 111 mmol/L   CO2 23 22 - 32 mmol/L   Glucose, Bld 85 70 - 99 mg/dL   BUN 14 6 - 20 mg/dL   Creatinine, Ser 5.37 0.61 - 1.24 mg/dL   Calcium 9.2 8.9 - 48.2 mg/dL   Total Protein 7.4 6.5 - 8.1 g/dL   Albumin 4.1 3.5 - 5.0 g/dL   AST 20 15 - 41 U/L   ALT 20 0 - 44 U/L   Alkaline Phosphatase 83 38 - 126 U/L   Total Bilirubin 0.8 0.3 - 1.2 mg/dL   GFR  calc non Af Amer >60 >60 mL/min   GFR calc Af Amer >60 >60 mL/min   Anion gap 8 5 - 15  Hemoglobin A1c  Result Value Ref Range   Hgb A1c MFr Bld 6.1 (H) 4.8 - 5.6 %   Mean Plasma Glucose 128.37 mg/dL      Assessment & Plan:    Encounter Diagnoses  Name Primary?  . Diabetes mellitus without complication (HCC) Yes  . Essential hypertension   . Hyperlipidemia, unspecified hyperlipidemia type   . Morbid obesity (HCC)   . Tobacco use disorder      -reviewed labs with pt  -pt to continue current medications -will refer pt for annual DM eye exam -DM Foot exam updated -encouraged Smoking cessation -encouraged pt to continue weight loss efforts.  Pt reprots that he used to weigh 400 pounds and has lost a lot but per EPic records, he has gained a lot.  Encouraged healthy diet and regular exercise -pt to follow up 3 months.   He is to contact office sooner prn

## 2020-02-27 ENCOUNTER — Other Ambulatory Visit: Payer: Self-pay | Admitting: Physician Assistant

## 2020-03-05 ENCOUNTER — Other Ambulatory Visit: Payer: Self-pay | Admitting: Physician Assistant

## 2020-03-13 ENCOUNTER — Other Ambulatory Visit: Payer: Self-pay

## 2020-03-13 ENCOUNTER — Ambulatory Visit: Payer: Self-pay | Admitting: Physician Assistant

## 2020-03-13 ENCOUNTER — Other Ambulatory Visit (HOSPITAL_COMMUNITY)
Admission: RE | Admit: 2020-03-13 | Discharge: 2020-03-13 | Disposition: A | Discharge status: Home / Self Care | Payer: Medicaid Other | Source: Ambulatory Visit | Attending: Physician Assistant | Admitting: Physician Assistant

## 2020-03-13 DIAGNOSIS — E785 Hyperlipidemia, unspecified: Secondary | ICD-10-CM | POA: Insufficient documentation

## 2020-03-13 DIAGNOSIS — E119 Type 2 diabetes mellitus without complications: Secondary | ICD-10-CM | POA: Insufficient documentation

## 2020-03-13 DIAGNOSIS — I10 Essential (primary) hypertension: Secondary | ICD-10-CM | POA: Insufficient documentation

## 2020-03-13 DIAGNOSIS — R69 Illness, unspecified: Secondary | ICD-10-CM | POA: Diagnosis present

## 2020-03-13 LAB — COMPREHENSIVE METABOLIC PANEL
ALT: 19 U/L (ref 0–44)
AST: 20 U/L (ref 15–41)
Albumin: 4.3 g/dL (ref 3.5–5.0)
Alkaline Phosphatase: 85 U/L (ref 38–126)
Anion gap: 9 (ref 5–15)
BUN: 15 mg/dL (ref 6–20)
CO2: 23 mmol/L (ref 22–32)
Calcium: 9.4 mg/dL (ref 8.9–10.3)
Chloride: 108 mmol/L (ref 98–111)
Creatinine, Ser: 1.33 mg/dL — ABNORMAL HIGH (ref 0.61–1.24)
GFR calc Af Amer: >60 mL/min (ref >60)
GFR calc non Af Amer: 58 mL/min — ABNORMAL LOW (ref >60)
Glucose, Bld: 100 mg/dL — ABNORMAL HIGH (ref 70–99)
Potassium: 4.2 mmol/L (ref 3.5–5.1)
Sodium: 140 mmol/L (ref 135–145)
Total Bilirubin: 0.4 mg/dL (ref 0.3–1.2)
Total Protein: 7.6 g/dL (ref 6.5–8.1)

## 2020-03-13 LAB — LIPID PANEL
Cholesterol: 149 mg/dL (ref 0–200)
HDL: 48 mg/dL (ref >40)
LDL Cholesterol: 72 mg/dL (ref 0–99)
Total CHOL/HDL Ratio: 3.1 RATIO
Triglycerides: 147 mg/dL (ref <150)
VLDL: 29 mg/dL (ref 0–40)

## 2020-03-13 LAB — HEMOGLOBIN A1C
Hgb A1c MFr Bld: 6.4 % — ABNORMAL HIGH (ref 4.8–5.6)
Mean Plasma Glucose: 136.98 mg/dL

## 2020-03-15 ENCOUNTER — Ambulatory Visit: Payer: Self-pay | Admitting: Physician Assistant

## 2020-03-15 ENCOUNTER — Encounter: Payer: Self-pay | Admitting: Physician Assistant

## 2020-03-15 DIAGNOSIS — I10 Essential (primary) hypertension: Secondary | ICD-10-CM

## 2020-03-15 DIAGNOSIS — Z125 Encounter for screening for malignant neoplasm of prostate: Secondary | ICD-10-CM

## 2020-03-15 DIAGNOSIS — R7989 Other specified abnormal findings of blood chemistry: Secondary | ICD-10-CM

## 2020-03-15 DIAGNOSIS — F17200 Nicotine dependence, unspecified, uncomplicated: Secondary | ICD-10-CM

## 2020-03-15 DIAGNOSIS — E669 Obesity, unspecified: Secondary | ICD-10-CM

## 2020-03-15 DIAGNOSIS — E119 Type 2 diabetes mellitus without complications: Secondary | ICD-10-CM

## 2020-03-15 DIAGNOSIS — E785 Hyperlipidemia, unspecified: Secondary | ICD-10-CM

## 2020-03-15 NOTE — Progress Notes (Signed)
There were no vitals taken for this visit.   Subjective:    [name]: Isaiah Ellis, male    DOB: Jul 23, 1961, 59 y.o.   MRN: [id number]  HPI: Isaiah Ellis is a 59 y.o. male presenting on 03/15/2020 for No chief complaint on file.   HPI   This is a telemedicine appointment due to coronavirus pandemic.  It started via [employer] was was changed to telephone due to technical difficulties.   I connected with  Kirstie Mirza on 03/15/20 by a video enabled telemedicine application and verified that I am speaking with the correct person using two identifiers.   I discussed the limitations of evaluation and management by telemedicine. The patient expressed understanding and agreed to proceed.  Pt is at home.  provider is at office.     Pt is 17yoM with DM, htn and dyslipidemia.  He says he is doing well and he has no complaints today.   Relevant past medical, surgical, family and social history reviewed and updated as indicated. Interim medical history since our last visit reviewed. Allergies and medications reviewed and updated.   Current Outpatient Medications:  .  amLODipine (NORVASC) 10 MG tablet, TAKE 1 Tablet BY MOUTH ONCE EVERY DAY, Disp: 90 tablet, Rfl: 1 .  aspirin 325 MG tablet, Take 325 mg by mouth as needed. , Disp: , Rfl:  .  atorvastatin (LIPITOR) 20 MG tablet, TAKE 1 Tablet BY MOUTH ONCE EVERY DAY, Disp: 90 tablet, Rfl: 1 .  ibuprofen (ADVIL,MOTRIN) 200 MG tablet, Take 800 mg by mouth every 6 (six) hours as needed. Pain., Disp: , Rfl:  .  lisinopril (ZESTRIL) 20 MG tablet, TAKE 1 Tablet BY MOUTH ONCE EVERY DAY, Disp: 90 tablet, Rfl: 1 .  metFORMIN (GLUCOPHAGE) 500 MG tablet, TAKE 1 Tablet  BY MOUTH TWICE DAILY WITH A MEAL, Disp: 180 tablet, Rfl: 1 .  metoprolol tartrate (LOPRESSOR) 50 MG tablet, TAKE 1 Tablet  BY MOUTH TWICE DAILY, Disp: 180 tablet, Rfl: 0    Review of Systems  Per HPI unless specifically indicated above     Objective:    There were no vitals taken  for this visit.  Wt Readings from Last 3 Encounters:  12/14/19 (!) 356 lb (161.5 kg)  02/28/19 300 lb (136.1 kg)  02/13/19 300 lb (136.1 kg)    Physical Exam Constitutional:      General: He is not in acute distress.    Appearance: Normal appearance. He is not ill-appearing.  HENT:     Head: Normocephalic and atraumatic.  Pulmonary:     Effort: Pulmonary effort is normal. No respiratory distress.  Neurological:     Mental Status: He is alert and oriented to person, place, and time.  Psychiatric:        Attention and Perception: Attention normal.        Speech: Speech normal.        Behavior: Behavior normal. Behavior is cooperative.     Results for orders placed or performed during the hospital encounter of 03/13/20  Hemoglobin A1c  Result Value Ref Range   Hgb A1c MFr Bld 6.4 (H) 4.8 - 5.6 %   Mean Plasma Glucose 136.98 mg/dL  Comprehensive metabolic panel  Result Value Ref Range   Sodium 140 135 - 145 mmol/L   Potassium 4.2 3.5 - 5.1 mmol/L   Chloride 108 98 - 111 mmol/L   CO2 23 22 - 32 mmol/L   Glucose, Bld 100 (H) 70 - 99  mg/dL   BUN 15 6 - 20 mg/dL   Creatinine, Ser 2.54 (H) 0.61 - 1.24 mg/dL   Calcium 9.4 8.9 - 27.0 mg/dL   Total Protein 7.6 6.5 - 8.1 g/dL   Albumin 4.3 3.5 - 5.0 g/dL   AST 20 15 - 41 U/L   ALT 19 0 - 44 U/L   Alkaline Phosphatase 85 38 - 126 U/L   Total Bilirubin 0.4 0.3 - 1.2 mg/dL   GFR calc non Af Amer 58 (L) >60 mL/min   GFR calc Af Amer >60 >60 mL/min   Anion gap 9 5 - 15  Lipid panel  Result Value Ref Range   Cholesterol 149 0 - 200 mg/dL   Triglycerides 623 <762 mg/dL   HDL 48 >83 mg/dL   Total CHOL/HDL Ratio 3.1 RATIO   VLDL 29 0 - 40 mg/dL   LDL Cholesterol 72 0 - 99 mg/dL      Assessment & Plan:    Encounter Diagnoses  Name Primary?  . Diabetes mellitus without complication (HCC) Yes  . Essential hypertension   . Hyperlipidemia, unspecified hyperlipidemia type   . Obesity, unspecified classification, unspecified  obesity type, unspecified whether serious comorbidity present   . Elevated serum creatinine   . Tobacco use disorder       -reviewed labs with pt.  Pt encouraged to drink plenty of water.  Will continue to monitor his renal function as Cr has been up and down over the past year. -pt to continue currenct medications -pt to follow up in 3 months.  He is to contact office sooner prn

## 2020-03-29 ENCOUNTER — Other Ambulatory Visit: Payer: Self-pay | Admitting: Physician Assistant

## 2020-05-01 ENCOUNTER — Ambulatory Visit: Payer: Self-pay | Admitting: Physician Assistant

## 2020-05-01 ENCOUNTER — Other Ambulatory Visit: Payer: Self-pay

## 2020-05-01 ENCOUNTER — Encounter: Payer: Self-pay | Admitting: Physician Assistant

## 2020-05-01 VITALS — BP 120/90 | HR 75 | Temp 98.6°F | Ht 70.0 in | Wt 344.0 lb

## 2020-05-01 DIAGNOSIS — R2689 Other abnormalities of gait and mobility: Secondary | ICD-10-CM

## 2020-05-01 DIAGNOSIS — E785 Hyperlipidemia, unspecified: Secondary | ICD-10-CM

## 2020-05-01 DIAGNOSIS — R296 Repeated falls: Secondary | ICD-10-CM

## 2020-05-01 DIAGNOSIS — F17200 Nicotine dependence, unspecified, uncomplicated: Secondary | ICD-10-CM

## 2020-05-01 DIAGNOSIS — E119 Type 2 diabetes mellitus without complications: Secondary | ICD-10-CM

## 2020-05-01 DIAGNOSIS — I10 Essential (primary) hypertension: Secondary | ICD-10-CM

## 2020-05-01 DIAGNOSIS — Z8673 Personal history of transient ischemic attack (TIA), and cerebral infarction without residual deficits: Secondary | ICD-10-CM

## 2020-05-01 NOTE — Progress Notes (Signed)
BP 120/90   Pulse 75   Temp 98.6 F (37 C)   Ht 5\' 10"  (1.778 m)   Wt (!) 344 lb (156 kg)   SpO2 97%   BMI 49.36 kg/m    Subjective:    [name]: Isaiah Ellis, male    DOB: Mar 04, 1961, 59 y.o.   MRN: [id number]  HPI: Isaiah Ellis is a 59 y.o. male presenting on 05/01/2020 for Gait Problem (balance problems)   HPI   Pt had a negative covid 19 screening questionnaire.     Pt is 25yoM with DM, htn and dyslipidemia.  He is in today for problems with balance and walking.    Pt fell and hurt knee about 1 1/2 week.  He has has problems walking since then.  He doesn't know if it is due to hurting his knee.  He has fallen several times since he started having problems/over the past 1 1/2 weeks.   He reports increased problems getting around.  He had a stroke march 2020.  He has been using a cane since his stroke.    He says he has gotten his covid vaccinations.       Relevant past medical, surgical, family and social history reviewed and updated as indicated. Interim medical history since our last visit reviewed. Allergies and medications reviewed and updated.   Current Outpatient Medications:  .  amLODipine (NORVASC) 10 MG tablet, TAKE 1 Tablet BY MOUTH ONCE EVERY DAY, Disp: 90 tablet, Rfl: 1 .  aspirin 325 MG tablet, Take 325 mg by mouth as needed. , Disp: , Rfl:  .  atorvastatin (LIPITOR) 20 MG tablet, TAKE 1 Tablet BY MOUTH ONCE EVERY DAY, Disp: 90 tablet, Rfl: 1 .  ibuprofen (ADVIL,MOTRIN) 200 MG tablet, Take 800 mg by mouth every 6 (six) hours as needed. Pain., Disp: , Rfl:  .  lisinopril (ZESTRIL) 20 MG tablet, TAKE 1 Tablet BY MOUTH ONCE EVERY DAY, Disp: 90 tablet, Rfl: 1 .  metFORMIN (GLUCOPHAGE) 500 MG tablet, TAKE 1 Tablet  BY MOUTH TWICE DAILY WITH A MEAL, Disp: 180 tablet, Rfl: 1 .  metoprolol tartrate (LOPRESSOR) 50 MG tablet, TAKE 1 Tablet  BY MOUTH TWICE DAILY, Disp: 180 tablet, Rfl: 0     Review of Systems  Per HPI unless specifically indicated  above     Objective:    BP 120/90   Pulse 75   Temp 98.6 F (37 C)   Ht 5\' 10"  (1.778 m)   Wt (!) 344 lb (156 kg)   SpO2 97%   BMI 49.36 kg/m   Wt Readings from Last 3 Encounters:  05/01/20 (!) 344 lb (156 kg)  12/14/19 (!) 356 lb (161.5 kg)  02/28/19 300 lb (136.1 kg)    Physical Exam Vitals reviewed.  Constitutional:      General: He is not in acute distress.    Appearance: He is well-developed. He is obese.  HENT:     Head: Normocephalic and atraumatic.  Cardiovascular:     Rate and Rhythm: Normal rate and regular rhythm.  Pulmonary:     Effort: Pulmonary effort is normal.     Breath sounds: Normal breath sounds. No wheezing.  Abdominal:     General: Bowel sounds are normal.     Palpations: Abdomen is soft.     Tenderness: There is no abdominal tenderness.  Musculoskeletal:     Cervical back: Neck supple.     Right knee: Normal range of motion. No tenderness.  Left knee: Normal range of motion. No tenderness.     Comments: Abrasion L knee- healing without signs infection.  Knee exam limited by body habitus  Lymphadenopathy:     Cervical: No cervical adenopathy.  Skin:    General: Skin is warm and dry.  Neurological:     Mental Status: He is alert and oriented to person, place, and time.     Gait: Gait abnormal.     Comments: Pt has difficulty ambulating and appears off balance while standing still  Psychiatric:        Attention and Perception: Attention normal.        Mood and Affect: Mood normal.        Speech: Speech normal.        Behavior: Behavior normal. Behavior is cooperative.           Assessment & Plan:   Encounter Diagnoses  Name Primary?  . Balance problem Yes  . Falling episodes   . History of completed stroke   . Diabetes mellitus without complication (HCC)   . Essential hypertension   . Hyperlipidemia, unspecified hyperlipidemia type   . Tobacco use disorder   . Morbid obesity (HCC)       - ? Another stroke-  neuro eval,  MRI, OT -pt to continue aspirin -pt is given CAFA/application for [employer] financial assistance -pt is encouraged to apply for medicaid since he says he has not done that yet -pt has routine follow up next month.  He is to contact office sooner prn

## 2020-05-02 ENCOUNTER — Other Ambulatory Visit: Payer: Self-pay | Admitting: Physician Assistant

## 2020-05-02 DIAGNOSIS — R2689 Other abnormalities of gait and mobility: Secondary | ICD-10-CM

## 2020-05-02 DIAGNOSIS — R296 Repeated falls: Secondary | ICD-10-CM

## 2020-05-02 DIAGNOSIS — Z8673 Personal history of transient ischemic attack (TIA), and cerebral infarction without residual deficits: Secondary | ICD-10-CM

## 2020-05-07 ENCOUNTER — Other Ambulatory Visit: Payer: Self-pay | Admitting: Physician Assistant

## 2020-05-07 MED ORDER — ASPIRIN 325 MG PO TABS: 325.0000 mg | ORAL_TABLET | Freq: Every day | ORAL | 1 refills | Status: DC

## 2020-05-24 ENCOUNTER — Ambulatory Visit (HOSPITAL_COMMUNITY): Payer: Self-pay | Admitting: Physical Therapy

## 2020-05-25 ENCOUNTER — Ambulatory Visit (HOSPITAL_COMMUNITY): Payer: Medicaid Other | Attending: Physician Assistant | Admitting: Physical Therapy

## 2020-05-25 ENCOUNTER — Encounter (HOSPITAL_COMMUNITY): Payer: Self-pay | Admitting: Physical Therapy

## 2020-05-25 ENCOUNTER — Other Ambulatory Visit: Payer: Self-pay

## 2020-05-25 DIAGNOSIS — R2689 Other abnormalities of gait and mobility: Secondary | ICD-10-CM | POA: Diagnosis not present

## 2020-05-25 DIAGNOSIS — M6281 Muscle weakness (generalized): Secondary | ICD-10-CM | POA: Insufficient documentation

## 2020-05-25 DIAGNOSIS — R296 Repeated falls: Secondary | ICD-10-CM | POA: Diagnosis present

## 2020-05-25 NOTE — Therapy (Signed)
[facility] [address] [place], [place], [address] Phone: [phone]   Fax:  [phone]  Physical Therapy Evaluation  Patient Details  Name: Isaiah Ellis MRN: [id number] Date of Birth: [birth year] Referring Provider (PT): Isaiah Ellis   Encounter Date: 05/25/2020   PT End of Session - 05/25/20 1656    Visit Number 1    Number of Visits 12    Date for PT Re-Evaluation 07/06/20    Authorization Type [employer]    Progress Note Due on Visit 10    PT Start Time 1315    PT Stop Time 1400    PT Time Calculation (min) 45 min    Activity Tolerance Patient tolerated treatment well    Behavior During Therapy [facility] for tasks assessed/performed           Past Medical History:  Diagnosis Date  . Depression   . Gout   . Hypertension   . Stroke [facility]) 2005 and 02/2019    History reviewed. No pertinent surgical history.  There were no vitals filed for this visit.    Subjective Assessment - 05/25/20 1320    Subjective PT states that he has had two strokes the first was in 2005 and the second was in April of 2020.  He has not had any therapy since his second stroke he has just been trying to improve his walking on his own.  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  Since his second stroke he has fallen 3-4 times and loses his balance on a regular basis.    Pertinent History CVA 2005/ and 02/2019.    Limitations Standing;Lifting;Walking;House hold activities;Sitting    How long can you sit comfortably? Pt states that if he sits too long it is difficult to get his legs working, ( about an hour)    How long can you stand comfortably? 30 minutes    How long can you walk comfortably? walks with a quad cane for about 45 minutes pt walks very slowly    Patient Stated Goals Pt would like to be abot to function without fear    Currently in Pain? No/denies               [facility] PT Assessment - 05/25/20 0001      Assessment   Medical Diagnosis balance disorder with hx of falling     Referring Provider (PT) Isaiah Ellis    Onset Date/Surgical Date 02/25/19    Next MD Visit unknown     Prior Therapy none      Precautions   Precautions Fall      Restrictions   Weight Bearing Restrictions No      Balance Screen   Has the patient fallen in the past 6 months Yes    How many times? 3    Has the patient had a decrease in activity level because of a fear of falling?  Yes    Is the patient reluctant to leave their home because of a fear of falling?  Yes      Prior Function   Level of Independence Independent with community mobility with device      Cognition   Overall Cognitive Status Within Functional Limits for tasks assessed      Functional Tests   Functional tests Single leg stance;Sit to Stand      Single Leg Stance  Comments unable      Sit to Stand   Comments 5 x in 72 seconds       ROM / Strength   AROM / PROM / Strength Strength      Strength   Strength Assessment Site Hip;Knee;Ankle    Right/Left Hip Right;Left    Right Hip Flexion 4/5    Right Hip Extension 3-/5    Right Hip ABduction 4/5    Left Hip Flexion 4/5    Left Hip Extension 3-/5    Left Hip ABduction 4/5    Right/Left Knee Right;Left    Right Knee Extension 5/5    Left Knee Flexion 5/5    Left Knee Extension 5/5    Right/Left Ankle Right;Left    Right Ankle Dorsiflexion 3+/5    Right Ankle Plantar Flexion 3-/5    Left Ankle Dorsiflexion 5/5    Left Ankle Plantar Flexion 5/5      Ambulation/Gait   Ambulation Distance (Feet) 100 Feet    Assistive device Small based quad cane    Gait Comments 2 MWT                      Objective measurements completed on examination: See above findings.       [facility] Adult PT Treatment/Exercise - 05/25/20 0001      Exercises   Exercises Knee/Hip      Knee/Hip Exercises: Seated   Other Seated Knee/Hip  Exercises Ankle pumps     Sit to Sand 5 reps      Knee/Hip Exercises: Supine   Bridges 10 reps                  PT Education - 05/25/20 1655    Methods Explanation;Demonstration;Handout    Comprehension Returned demonstration;Verbalized understanding            PT Short Term Goals - 05/25/20 1704      PT SHORT TERM GOAL #1   Title PT to be I in a HEP    Time 3    Period Weeks    Status New    Target Date 06/15/20      PT SHORT TERM GOAL #2   Title PT to be able to single leg stance on both LE for 10 seconds to decrease risk of falling    Time 3    Period Weeks    Status New             PT Long Term Goals - 05/25/20 1705      PT LONG TERM GOAL #1   Title PT to be I in an advance HEP    Period Weeks    Status New    Target Date 07/06/20      PT LONG TERM GOAL #2   Title PT to be able to single leg stance for 20 seconds each to reduce risk of falling.    Time 6    Period Weeks    Status New      PT LONG TERM GOAL #3   Title PT strength to increase one grade in core and LE to allow pt to come sit to stand without use of UE    Time 6    Period Weeks    Status New                  Plan - 05/25/20 1658    Clinical Impression Statement Isaiah Ellis is a  59 yo male who has had two CVA the first was in 2005 and the second was April of 2020.  He has not had any therapy and is being referred to skilled physical therapy.  Evaluation demonstrates decreased balance, decreased stength, gait abnormality and decreased activity tolerance.  Isaiah Ellis will benefit from skilled PT to address these issues and decrease his risk of falling .  He had an odor and admitted to smoking marjuana prior to therapy.  The therapist explained that we will not work with the pt if he comes to therapy in an altered state.    Personal Factors and Comorbidities Comorbidity 2;Fitness;Social Background;Time since onset of injury/illness/exacerbation    Comorbidities obesity, HTN, CVA,     Examination-Activity Limitations Carry;Dressing;Locomotion Level;Squat;Stairs;Stand    Examination-Participation Restrictions Cleaning;Community Activity;Driving;Laundry;Meal Prep;Yard Work;Shop    Stability/Clinical Decision Making Stable/Uncomplicated    Clinical Decision Making Moderate    Rehab Potential Good    PT Frequency 2x / week    PT Duration 6 weeks    PT Treatment/Interventions Balance training;Therapeutic exercise;Therapeutic activities;Gait training;Stair training;Functional mobility training;Neuromuscular re-education;Patient/family education    PT Next Visit Plan begin balance activity begin standing low level activities    PT Home Exercise Plan sit to stand, bridge and sitting ankle pumps           Patient will benefit from skilled therapeutic intervention in order to improve the following deficits and impairments:  Abnormal gait, Decreased activity tolerance, Decreased balance, Decreased mobility, Difficulty walking, Obesity, Decreased strength  Visit Diagnosis: Other abnormalities of gait and mobility - Plan: PT plan of care cert/re-cert  Muscle weakness (generalized) - Plan: PT plan of care cert/re-cert  Repeated falls - Plan: PT plan of care cert/re-cert     Problem List Patient Active Problem List   Diagnosis Date Noted  . Hyperlipidemia 05/18/2019  . Diabetes mellitus without complication (HCC) 05/18/2019  . Tobacco use disorder 05/18/2019  . Essential hypertension 03/28/2019  Virgina Organ, PT CLT [phone] 05/25/2020, 5:10 PM  [facility] [address] [place], [place], [address] Phone: [phone]   Fax:  [phone]  Name: Isaiah Ellis MRN: [id number] Date of Birth: [birth year]

## 2020-05-28 ENCOUNTER — Ambulatory Visit (HOSPITAL_COMMUNITY): Payer: Medicaid Other | Admitting: Physical Therapy

## 2020-05-28 ENCOUNTER — Encounter (HOSPITAL_COMMUNITY): Payer: Self-pay | Admitting: Physical Therapy

## 2020-05-28 ENCOUNTER — Other Ambulatory Visit: Payer: Self-pay

## 2020-05-28 DIAGNOSIS — R2689 Other abnormalities of gait and mobility: Secondary | ICD-10-CM | POA: Diagnosis not present

## 2020-05-28 DIAGNOSIS — R296 Repeated falls: Secondary | ICD-10-CM

## 2020-05-28 DIAGNOSIS — M6281 Muscle weakness (generalized): Secondary | ICD-10-CM

## 2020-05-28 NOTE — Therapy (Signed)
[place] [facility] [address] [place], [place], [address] Phone: [phone]   Fax:  [phone]  Physical Therapy Treatment  Patient Details  Name: Isaiah Ellis MRN: [id number] Date of Birth: 1961/11/11 Referring Provider (PT): Jacquelin Hawking   Encounter Date: 05/28/2020   PT End of Session - 05/28/20 1310    Visit Number 2    Number of Visits 12    Date for PT Re-Evaluation 07/06/20    Authorization Type cone discount    Progress Note Due on Visit 10    PT Start Time 1301    PT Stop Time 1342    PT Time Calculation (min) 41 min    Activity Tolerance Patient tolerated treatment well;Patient limited by fatigue    Behavior During Therapy [facility] for tasks assessed/performed           Past Medical History:  Diagnosis Date  . Depression   . Gout   . Hypertension   . Stroke [facility]) 2005 and 02/2019    History reviewed. No pertinent surgical history.  There were no vitals filed for this visit.   Subjective Assessment - 05/28/20 1308    Subjective Patient reports no falls since last visit. Says he was very fatigued after evaluation. Says he is having some issues with his living situation and is not able to do his exercises as he would like to.    Pertinent History CVA 2005/ and 02/2019.    Limitations Standing;Lifting;Walking;House hold activities;Sitting    How long can you sit comfortably? Pt states that if he sits too long it is difficult to get his legs working, ( about an hour)    How long can you stand comfortably? 30 minutes    How long can you walk comfortably? walks with a quad cane for about 45 minutes pt walks very slowly    Patient Stated Goals Pt would like to be abot to function without fear    Currently in Pain? No/denies                             [facility] Adult PT Treatment/Exercise - 05/28/20 0001      Knee/Hip Exercises: Standing   Heel Raises 2 sets;10 reps    Hip Abduction Both;2 sets;10  reps;Knee straight    Other Standing Knee Exercises semi tandem stance 3 x 20"       Knee/Hip Exercises: Seated   Long Arc Quad Both;2 sets;10 reps    Other Seated Knee/Hip Exercises seated heel/toe raises x 20 each     Marching Both;20 reps    Sit to Sand 2 sets;5 reps;with UE support                    PT Short Term Goals - 05/25/20 1704      PT SHORT TERM GOAL #1   Title PT to be I in a HEP    Time 3    Period Weeks    Status New    Target Date 06/15/20      PT SHORT TERM GOAL #2   Title PT to be able to single leg stance on both LE for 10 seconds to decrease risk of falling    Time 3    Period Weeks    Status New             PT Long Term Goals - 05/25/20 1705  PT LONG TERM GOAL #1   Title PT to be I in an advance HEP    Period Weeks    Status New    Target Date 07/06/20      PT LONG TERM GOAL #2   Title PT to be able to single leg stance for 20 seconds each to reduce risk of falling.    Time 6    Period Weeks    Status New      PT LONG TERM GOAL #3   Title PT strength to increase one grade in core and LE to allow pt to come sit to stand without use of UE    Time 6    Period Weeks    Status New                 Plan - 05/28/20 1342    Clinical Impression Statement Patient tolerated session well overall today. Reviewed patient goals and HEP. Initiated and progressed ther ex program today for increased LE strengthening and static balance. Patient educated on proper form and function of all added exercise. Patient requires verbal cues for controlled eccentric lowering with sit to stands. Patient with increased fatigue and requires several breaks for rest.    Personal Factors and Comorbidities Comorbidity 2;Fitness;Social Background;Time since onset of injury/illness/exacerbation    Comorbidities obesity, HTN, CVA,    Examination-Activity Limitations Carry;Dressing;Locomotion Level;Squat;Stairs;Stand    Examination-Participation  Restrictions Cleaning;Community Activity;Driving;Laundry;Meal Prep;Yard Work;Shop    Stability/Clinical Decision Making Stable/Uncomplicated    Rehab Potential Good    PT Frequency 2x / week    PT Duration 6 weeks    PT Treatment/Interventions Balance training;Therapeutic exercise;Therapeutic activities;Gait training;Stair training;Functional mobility training;Neuromuscular re-education;Patient/family education    PT Next Visit Plan Continue balance activity and progress standing low level activities    PT Home Exercise Plan sit to stand, bridge and sitting ankle pumps 05/28/20: seated march, LAQs    Consulted and Agree with Plan of Care Patient           Patient will benefit from skilled therapeutic intervention in order to improve the following deficits and impairments:  Abnormal gait, Decreased activity tolerance, Decreased balance, Decreased mobility, Difficulty walking, Obesity, Decreased strength  Visit Diagnosis: Other abnormalities of gait and mobility  Muscle weakness (generalized)  Repeated falls     Problem List Patient Active Problem List   Diagnosis Date Noted  . Hyperlipidemia 05/18/2019  . Diabetes mellitus without complication (HCC) 05/18/2019  . Tobacco use disorder 05/18/2019  . Essential hypertension 03/28/2019   1:46 PM, 05/28/20 Georges Lynch PT DPT  Physical Therapist with [facility]  [facility]  [phone]   [facility] [address] [place], [place], [address] Phone: [phone]   Fax:  [phone]  Name: Isaiah Ellis MRN: [id number] Date of Birth: 07-17-1961

## 2020-05-28 NOTE — Patient Instructions (Signed)
Access Code: [email or web address] URL: [email or web address] Date: 05/28/2020 Prepared by: Georges Lynch  Exercises Seated March - 3 x daily - 7 x weekly - 2 sets - 10 reps Seated Long Arc Quad - 3 x daily - 7 x weekly - 2 sets - 10 reps

## 2020-05-31 ENCOUNTER — Encounter (HOSPITAL_COMMUNITY): Payer: Self-pay | Admitting: Physical Therapy

## 2020-05-31 ENCOUNTER — Other Ambulatory Visit: Payer: Self-pay

## 2020-05-31 ENCOUNTER — Ambulatory Visit (HOSPITAL_COMMUNITY): Payer: Medicaid Other | Admitting: Physical Therapy

## 2020-05-31 DIAGNOSIS — M6281 Muscle weakness (generalized): Secondary | ICD-10-CM

## 2020-05-31 DIAGNOSIS — R2689 Other abnormalities of gait and mobility: Secondary | ICD-10-CM | POA: Diagnosis not present

## 2020-05-31 DIAGNOSIS — R296 Repeated falls: Secondary | ICD-10-CM

## 2020-05-31 NOTE — Therapy (Signed)
[facility] [address] [place], [place], [address] Phone: [phone]   Fax:  [phone]  Physical Therapy Treatment  Patient Details  Name: Isaiah Ellis MRN: [id number] Date of Birth: 01/09/1961 Referring Provider (PT): Jacquelin Hawking   Encounter Date: 05/31/2020   PT End of Session - 05/31/20 1015    Visit Number 3    Number of Visits 12    Date for PT Re-Evaluation 07/06/20    Authorization Type cone discount    Progress Note Due on Visit 10    PT Start Time [phone]    PT Stop Time 1024    PT Time Calculation (min) 38 min    Activity Tolerance Patient tolerated treatment well;Patient limited by fatigue    Behavior During Therapy [facility] for tasks assessed/performed           Past Medical History:  Diagnosis Date  . Depression   . Gout   . Hypertension   . Stroke [facility]) 2005 and 02/2019    History reviewed. No pertinent surgical history.  There were no vitals filed for this visit.   Subjective Assessment - 05/31/20 0951    Subjective Patient reported that the main issue he is having is that he is looking for a new place to live.    Pertinent History CVA 2005/ and 02/2019.    Limitations Standing;Lifting;Walking;House hold activities;Sitting    How long can you sit comfortably? Pt states that if he sits too long it is difficult to get his legs working, ( about an hour)    How long can you stand comfortably? 30 minutes    How long can you walk comfortably? walks with a quad cane for about 45 minutes pt walks very slowly    Patient Stated Goals Pt would like to be abot to function without fear    Currently in Pain? No/denies                             [facility] Adult PT Treatment/Exercise - 05/31/20 0001      Knee/Hip Exercises: Standing   Heel Raises 2 sets;10 reps    Hip Abduction Both;2 sets;10 reps;Knee straight    Hip Extension Stengthening;Right;Left;2 sets;10 reps;Knee straight    Forward Step Up  Right;Left;1 set;10 reps;Step Height: 4";Hand Hold: 2    Other Standing Knee Exercises semi tandem stance 3 x 20"       Knee/Hip Exercises: Seated   Long Arc Quad Both;2 sets;10 reps    Sit to [employer] 2 sets;5 reps;with UE support                    PT Short Term Goals - 05/25/20 1704      PT SHORT TERM GOAL #1   Title PT to be I in a HEP    Time 3    Period Weeks    Status New    Target Date 06/15/20      PT SHORT TERM GOAL #2   Title PT to be able to single leg stance on both LE for 10 seconds to decrease risk of falling    Time 3    Period Weeks    Status New             PT Long Term Goals - 05/25/20 1705      PT LONG TERM GOAL #1   Title PT to be I in an  advance HEP    Period Weeks    Status New    Target Date 07/06/20      PT LONG TERM GOAL #2   Title PT to be able to single leg stance for 20 seconds each to reduce risk of falling.    Time 6    Period Weeks    Status New      PT LONG TERM GOAL #3   Title PT strength to increase one grade in core and LE to allow pt to come sit to stand without use of UE    Time 6    Period Weeks    Status New                 Plan - 05/31/20 1026    Clinical Impression Statement Focused on functional strengthening and balance this session. Introduced step-ups this session. Patient required bilateral upper extremity support for this and demonstrated decreased proprioception of the right lower extremity with foot placement on the step. Patient required frequent therapeutic rest breaks to perform all exercises.    Personal Factors and Comorbidities Comorbidity 2;Fitness;Social Background;Time since onset of injury/illness/exacerbation    Comorbidities obesity, HTN, CVA,    Examination-Activity Limitations Carry;Dressing;Locomotion Level;Squat;Stairs;Stand    Examination-Participation Restrictions Cleaning;Community Activity;Driving;Laundry;Meal Prep;Yard Work;Shop    Stability/Clinical Decision Making  Stable/Uncomplicated    Rehab Potential Good    PT Frequency 2x / week    PT Duration 6 weeks    PT Treatment/Interventions Balance training;Therapeutic exercise;Therapeutic activities;Gait training;Stair training;Functional mobility training;Neuromuscular re-education;Patient/family education    PT Next Visit Plan Continue balance activity and progress standing low level activities. Increase step-up height as able. Sit to stands from elevated mat working on not using upper extremities.    PT Home Exercise Plan sit to stand, bridge and sitting ankle pumps 05/28/20: seated march, LAQs    Consulted and Agree with Plan of Care Patient           Patient will benefit from skilled therapeutic intervention in order to improve the following deficits and impairments:  Abnormal gait, Decreased activity tolerance, Decreased balance, Decreased mobility, Difficulty walking, Obesity, Decreased strength  Visit Diagnosis: Other abnormalities of gait and mobility  Muscle weakness (generalized)  Repeated falls     Problem List Patient Active Problem List   Diagnosis Date Noted  . Hyperlipidemia 05/18/2019  . Diabetes mellitus without complication (HCC) 05/18/2019  . Tobacco use disorder 05/18/2019  . Essential hypertension 03/28/2019   Verne Carrow PT, DPT 10:34 AM, 05/31/20 [phone]  [facility] [address] [place], [place], [address] Phone: [phone]   Fax:  [phone]  Name: RICARD FAULKNER MRN: [id number] Date of Birth: August 18, 1961

## 2020-06-04 ENCOUNTER — Encounter (HOSPITAL_COMMUNITY): Payer: Self-pay

## 2020-06-04 ENCOUNTER — Other Ambulatory Visit: Payer: Self-pay

## 2020-06-04 ENCOUNTER — Ambulatory Visit (HOSPITAL_COMMUNITY): Payer: Medicaid Other

## 2020-06-04 DIAGNOSIS — R296 Repeated falls: Secondary | ICD-10-CM

## 2020-06-04 DIAGNOSIS — R2689 Other abnormalities of gait and mobility: Secondary | ICD-10-CM | POA: Diagnosis not present

## 2020-06-04 DIAGNOSIS — M6281 Muscle weakness (generalized): Secondary | ICD-10-CM

## 2020-06-04 NOTE — Therapy (Signed)
[facility] [facility] [address] [place], [place], [address] Phone: [phone]   Fax:  [phone]  Physical Therapy Treatment  Patient Details  Name: Isaiah Ellis MRN: [id number] Date of Birth: July 08, 1961 Referring Provider (PT): Jacquelin Hawking   Encounter Date: 06/04/2020   PT End of Session - 06/04/20 1058    Visit Number 4    Number of Visits 12    Date for PT Re-Evaluation 07/06/20    Authorization Type cone discount    Progress Note Due on Visit 10    PT Start Time 1049    PT Stop Time 1139    PT Time Calculation (min) 50 min    Activity Tolerance Patient tolerated treatment well;Patient limited by fatigue    Behavior During Therapy [facility] for tasks assessed/performed           Past Medical History:  Diagnosis Date  . Depression   . Gout   . Hypertension   . Stroke [facility]) 2005 and 02/2019    History reviewed. No pertinent surgical history.  There were no vitals filed for this visit.   Subjective Assessment - 06/04/20 1047    Subjective Biggest issue is balance. No pain. Wants to be able to do more. Hasn't bee doing his HEP. Really struggles with the unsupportive environment at home.    Pertinent History CVA 2005/ and 02/2019.    Limitations Standing;Lifting;Walking;House hold activities;Sitting    How long can you sit comfortably? Pt states that if he sits too long it is difficult to get his legs working, ( about an hour)    How long can you stand comfortably? 30 minutes    How long can you walk comfortably? walks with a quad cane for about 45 minutes pt walks very slowly    Patient Stated Goals Pt would like to be abot to function without fear                             [facility] Adult PT Treatment/Exercise - 06/04/20 0001      Knee/Hip Exercises: Standing   Heel Raises 2 sets;10 reps    Hip Abduction Both;2 sets;10 reps;Knee straight    Hip Extension Stengthening;Right;Left;2 sets;10 reps;Knee straight     Forward Step Up Right;Left;1 set;10 reps;Hand Hold: 2;Step Height: 6"    Functional Squat 1 set;10 reps    Other Standing Knee Exercises semi tandem stance 3 x 20"       Knee/Hip Exercises: Seated   Sit to Sand 2 sets;5 reps;without UE support                  PT Education - 06/04/20 1057    Education Details Discussed exercises and purpose throughout.    Person(s) Educated Patient    Methods Explanation    Comprehension Verbalized understanding            PT Short Term Goals - 06/04/20 1101      PT SHORT TERM GOAL #1   Title PT to be I in a HEP    Time 3    Period Weeks    Status On-going    Target Date 06/15/20      PT SHORT TERM GOAL #2   Title PT to be able to single leg stance on both LE for 10 seconds to decrease risk of falling    Time 3    Period Weeks    Status  On-going             PT Long Term Goals - 06/04/20 1101      PT LONG TERM GOAL #1   Title PT to be I in an advance HEP    Period Weeks    Status On-going      PT LONG TERM GOAL #2   Title PT to be able to single leg stance for 20 seconds each to reduce risk of falling.    Time 6    Period Weeks    Status On-going      PT LONG TERM GOAL #3   Title PT strength to increase one grade in core and LE to allow pt to come sit to stand without use of UE    Time 6    Period Weeks    Status On-going                 Plan - 06/04/20 1059    Clinical Impression Statement Focused on functional strengthening, endurance for activity and balance this session. Continued step-ups this session but increased to a 6 inch height. Patient required bilateral upper extremity support for this. Sit to stand exercise performed from 22 inch surface height without UE support. Required modelling, verbal and tactile cuing. Functional squat with UE support on solid surface. Patient required frequent standing rest breaks to perform all exercises.    Personal Factors and Comorbidities Comorbidity  2;Fitness;Social Background;Time since onset of injury/illness/exacerbation    Comorbidities obesity, HTN, CVA,    Examination-Activity Limitations Carry;Dressing;Locomotion Level;Squat;Stairs;Stand    Examination-Participation Restrictions Cleaning;Community Activity;Driving;Laundry;Meal Prep;Yard Work;Shop    Stability/Clinical Decision Making Stable/Uncomplicated    Rehab Potential Good    PT Frequency 2x / week    PT Duration 6 weeks    PT Treatment/Interventions Balance training;Therapeutic exercise;Therapeutic activities;Gait training;Stair training;Functional mobility training;Neuromuscular re-education;Patient/family education    PT Next Visit Plan Continue balance activity and progress standing low level activities. Increase step-up height as able. Sit to stands from elevated mat working on not using upper extremities.    PT Home Exercise Plan sit to stand, bridge and sitting ankle pumps 05/28/20: seated march, LAQs    Consulted and Agree with Plan of Care Patient           Patient will benefit from skilled therapeutic intervention in order to improve the following deficits and impairments:  Abnormal gait, Decreased activity tolerance, Decreased balance, Decreased mobility, Difficulty walking, Obesity, Decreased strength  Visit Diagnosis: Other abnormalities of gait and mobility  Muscle weakness (generalized)  Repeated falls     Problem List Patient Active Problem List   Diagnosis Date Noted  . Hyperlipidemia 05/18/2019  . Diabetes mellitus without complication (HCC) 05/18/2019  . Tobacco use disorder 05/18/2019  . Essential hypertension 03/28/2019    Katina Dung. Hartnett-Rands, MS, PT Per [facility] #[id number] 06/04/2020, 12:44 PM  [place] [facility] [address] [place], [place], [address] Phone: [phone]   Fax:  [phone]  Name: Isaiah Ellis MRN: [id number] Date of Birth: Apr 30, 1961

## 2020-06-05 ENCOUNTER — Ambulatory Visit (HOSPITAL_COMMUNITY)
Admission: RE | Admit: 2020-06-05 | Discharge: 2020-06-05 | Disposition: A | Discharge status: Home / Self Care | Payer: Medicaid Other | Source: Ambulatory Visit | Attending: Physician Assistant | Admitting: Physician Assistant

## 2020-06-05 ENCOUNTER — Other Ambulatory Visit (HOSPITAL_COMMUNITY)
Admission: RE | Admit: 2020-06-05 | Discharge: 2020-06-05 | Disposition: A | Discharge status: Home / Self Care | Payer: Medicaid Other | Source: Ambulatory Visit | Attending: Physician Assistant | Admitting: Physician Assistant

## 2020-06-05 DIAGNOSIS — R2689 Other abnormalities of gait and mobility: Secondary | ICD-10-CM | POA: Insufficient documentation

## 2020-06-05 DIAGNOSIS — E785 Hyperlipidemia, unspecified: Secondary | ICD-10-CM | POA: Insufficient documentation

## 2020-06-05 DIAGNOSIS — Z125 Encounter for screening for malignant neoplasm of prostate: Secondary | ICD-10-CM | POA: Insufficient documentation

## 2020-06-05 DIAGNOSIS — I10 Essential (primary) hypertension: Secondary | ICD-10-CM | POA: Insufficient documentation

## 2020-06-05 DIAGNOSIS — Z8673 Personal history of transient ischemic attack (TIA), and cerebral infarction without residual deficits: Secondary | ICD-10-CM | POA: Diagnosis present

## 2020-06-05 DIAGNOSIS — R7989 Other specified abnormal findings of blood chemistry: Secondary | ICD-10-CM | POA: Insufficient documentation

## 2020-06-05 DIAGNOSIS — R296 Repeated falls: Secondary | ICD-10-CM | POA: Diagnosis present

## 2020-06-05 DIAGNOSIS — E119 Type 2 diabetes mellitus without complications: Secondary | ICD-10-CM | POA: Insufficient documentation

## 2020-06-05 LAB — COMPREHENSIVE METABOLIC PANEL
ALT: 17 U/L (ref 0–44)
AST: 19 U/L (ref 15–41)
Albumin: 4.4 g/dL (ref 3.5–5.0)
Alkaline Phosphatase: 84 U/L (ref 38–126)
Anion gap: 8 (ref 5–15)
BUN: 12 mg/dL (ref 6–20)
CO2: 25 mmol/L (ref 22–32)
Calcium: 9.6 mg/dL (ref 8.9–10.3)
Chloride: 107 mmol/L (ref 98–111)
Creatinine, Ser: 1.23 mg/dL (ref 0.61–1.24)
GFR calc Af Amer: >60 mL/min (ref >60)
GFR calc non Af Amer: >60 mL/min (ref >60)
Glucose, Bld: 93 mg/dL (ref 70–99)
Potassium: 3.9 mmol/L (ref 3.5–5.1)
Sodium: 140 mmol/L (ref 135–145)
Total Bilirubin: 0.6 mg/dL (ref 0.3–1.2)
Total Protein: 7.9 g/dL (ref 6.5–8.1)

## 2020-06-05 LAB — LIPID PANEL
Cholesterol: 165 mg/dL (ref 0–200)
HDL: 54 mg/dL (ref >40)
LDL Cholesterol: 86 mg/dL (ref 0–99)
Total CHOL/HDL Ratio: 3.1 RATIO
Triglycerides: 125 mg/dL (ref <150)
VLDL: 25 mg/dL (ref 0–40)

## 2020-06-05 LAB — HEMOGLOBIN A1C
Hgb A1c MFr Bld: 6.2 % — ABNORMAL HIGH (ref 4.8–5.6)
Mean Plasma Glucose: 131.24 mg/dL

## 2020-06-05 LAB — PSA: Prostatic Specific Antigen: 0.77 ng/mL (ref 0.00–4.00)

## 2020-06-06 ENCOUNTER — Ambulatory Visit (HOSPITAL_COMMUNITY): Payer: Medicaid Other

## 2020-06-06 ENCOUNTER — Other Ambulatory Visit: Payer: Self-pay

## 2020-06-06 DIAGNOSIS — R2689 Other abnormalities of gait and mobility: Secondary | ICD-10-CM

## 2020-06-06 DIAGNOSIS — M6281 Muscle weakness (generalized): Secondary | ICD-10-CM

## 2020-06-06 DIAGNOSIS — R296 Repeated falls: Secondary | ICD-10-CM

## 2020-06-06 NOTE — Therapy (Signed)
[place] [facility] [address] [place], [place], [address] Phone: [phone]   Fax:  [phone]  Physical Therapy Treatment  Patient Details  Name: Isaiah Ellis MRN: [id number] Date of Birth: [birth year] Referring Provider (PT): Jacquelin Hawking   Encounter Date: 06/06/2020   PT End of Session - 06/06/20 1029    Visit Number 5    Number of Visits 12    Date for PT Re-Evaluation 07/06/20    Authorization Type cone discount    Progress Note Due on Visit 10    PT Start Time 1035    PT Stop Time 1119    PT Time Calculation (min) 44 min    Activity Tolerance Patient tolerated treatment well;Patient limited by fatigue    Behavior During Therapy [facility] for tasks assessed/performed           Past Medical History:  Diagnosis Date  . Depression   . Gout   . Hypertension   . Stroke [facility]) 2005 and 02/2019    No past surgical history on file.  There were no vitals filed for this visit.   Subjective Assessment - 06/06/20 1041    Subjective No pain to speak of. Doesn't feel exercises in bed would be any easier for him to complete in his home environment.    Pertinent History CVA 2005/ and 02/2019.    Limitations Standing;Lifting;Walking;House hold activities;Sitting    How long can you sit comfortably? Pt states that if he sits too long it is difficult to get his legs working, ( about an hour)    How long can you stand comfortably? 30 minutes    How long can you walk comfortably? walks with a quad cane for about 45 minutes pt walks very slowly    Patient Stated Goals Pt would like to be abot to function without fear                             [facility] Adult PT Treatment/Exercise - 06/06/20 0001      Knee/Hip Exercises: Standing   Heel Raises 2 sets;10 reps   on incline   Heel Raises Limitations toe raises on incline 2x10    Hip Abduction Both;2 sets;10 reps;Knee straight    Hip Extension Stengthening;Right;Left;2 sets;10  reps;Knee straight    Forward Step Up Right;Left;1 set;10 reps;Hand Hold: 2;Step Height: 6"    Functional Squat 1 set;10 reps    Other Standing Knee Exercises tandem stance 3 x 20"       Knee/Hip Exercises: Seated   Sit to Sand 1 set;10 reps;without UE support                  PT Education - 06/06/20 1043    Education Details Discussed exercises and purpose throughout.    Person(s) Educated Patient    Methods Explanation;Demonstration    Comprehension Verbalized understanding;Returned demonstration            PT Short Term Goals - 06/06/20 1029      PT SHORT TERM GOAL #1   Title PT to be I in a HEP    Time 3    Period Weeks    Status On-going    Target Date 06/15/20      PT SHORT TERM GOAL #2   Title PT to be able to single leg stance on both LE for 10 seconds to decrease risk of falling    Time  3    Period Weeks    Status On-going             PT Long Term Goals - 06/06/20 1029      PT LONG TERM GOAL #1   Title PT to be I in an advance HEP    Period Weeks    Status On-going      PT LONG TERM GOAL #2   Title PT to be able to single leg stance for 20 seconds each to reduce risk of falling.    Time 6    Period Weeks    Status On-going      PT LONG TERM GOAL #3   Title PT strength to increase one grade in core and LE to allow pt to come sit to stand without use of UE    Time 6    Period Weeks    Status On-going                 Plan - 06/06/20 1029    Clinical Impression Statement Continued focus on functional strengthening, endurance for activity and balance this session. Continued 6" step-ups. Patient required bilateral upper extremity support for this. Added toe raises on incline; advance heel raises to incline. Advanced from semi-tandem stance to tandem stance balance exercises. Patient required less frequent standing rest breaks to perform all exercises. Continue with current plan. Progress as able.    Personal Factors and Comorbidities  Comorbidity 2;Fitness;Social Background;Time since onset of injury/illness/exacerbation    Comorbidities obesity, HTN, CVA,    Examination-Activity Limitations Carry;Dressing;Locomotion Level;Squat;Stairs;Stand    Examination-Participation Restrictions Cleaning;Community Activity;Driving;Laundry;Meal Prep;Yard Work;Shop    Stability/Clinical Decision Making Stable/Uncomplicated    Rehab Potential Good    PT Frequency 2x / week    PT Duration 6 weeks    PT Treatment/Interventions Balance training;Therapeutic exercise;Therapeutic activities;Gait training;Stair training;Functional mobility training;Neuromuscular re-education;Patient/family education    PT Next Visit Plan Continue balance activity and progress standing low level activities. Increase step-up height as able. Sit to stands from elevated mat working on not using upper extremities. Add SLS w/ vectors as able.    PT Home Exercise Plan sit to stand, bridge and sitting ankle pumps 05/28/20: seated march, LAQs    Consulted and Agree with Plan of Care Patient           Patient will benefit from skilled therapeutic intervention in order to improve the following deficits and impairments:  Abnormal gait, Decreased activity tolerance, Decreased balance, Decreased mobility, Difficulty walking, Obesity, Decreased strength  Visit Diagnosis: Other abnormalities of gait and mobility  Muscle weakness (generalized)  Repeated falls     Problem List Patient Active Problem List   Diagnosis Date Noted  . Hyperlipidemia 05/18/2019  . Diabetes mellitus without complication (HCC) 05/18/2019  . Tobacco use disorder 05/18/2019  . Essential hypertension 03/28/2019    Katina Dung. Hartnett-Rands, MS, PT Per [facility] #[id number] 06/06/2020, 11:27 AM  [place] [facility] [address] [place], [place], [address] Phone: [phone]   Fax:  [phone]  Name: Isaiah Ellis MRN: [id number] Date  of Birth: Jun 18, 1961

## 2020-06-13 ENCOUNTER — Encounter: Payer: Self-pay | Admitting: Physician Assistant

## 2020-06-13 ENCOUNTER — Ambulatory Visit: Payer: Self-pay | Admitting: Physician Assistant

## 2020-06-13 ENCOUNTER — Other Ambulatory Visit: Payer: Self-pay

## 2020-06-13 ENCOUNTER — Ambulatory Visit (HOSPITAL_COMMUNITY): Payer: Medicaid Other | Admitting: Physical Therapy

## 2020-06-13 VITALS — BP 96/70 | HR 67 | Temp 98.1°F | Ht 70.25 in | Wt 342.0 lb

## 2020-06-13 DIAGNOSIS — Z8673 Personal history of transient ischemic attack (TIA), and cerebral infarction without residual deficits: Secondary | ICD-10-CM

## 2020-06-13 DIAGNOSIS — F17200 Nicotine dependence, unspecified, uncomplicated: Secondary | ICD-10-CM

## 2020-06-13 DIAGNOSIS — E785 Hyperlipidemia, unspecified: Secondary | ICD-10-CM

## 2020-06-13 DIAGNOSIS — R930 Abnormal findings on diagnostic imaging of skull and head, not elsewhere classified: Secondary | ICD-10-CM

## 2020-06-13 DIAGNOSIS — R296 Repeated falls: Secondary | ICD-10-CM

## 2020-06-13 DIAGNOSIS — E119 Type 2 diabetes mellitus without complications: Secondary | ICD-10-CM

## 2020-06-13 DIAGNOSIS — R2689 Other abnormalities of gait and mobility: Secondary | ICD-10-CM

## 2020-06-13 DIAGNOSIS — M6281 Muscle weakness (generalized): Secondary | ICD-10-CM

## 2020-06-13 DIAGNOSIS — I10 Essential (primary) hypertension: Secondary | ICD-10-CM

## 2020-06-13 MED ORDER — AMLODIPINE BESYLATE 5 MG PO TABS
5.0000 mg | ORAL_TABLET | Freq: Every day | ORAL | 0 refills | Status: DC
Start: 2020-06-13 — End: 2020-07-25

## 2020-06-13 NOTE — Therapy (Signed)
[facility] [address] [place], [place], [address] Phone: [phone]   Fax:  [phone]  Physical Therapy Treatment  Patient Details  Name: Isaiah Ellis MRN: [id number] Date of Birth: 05/25/1961 Referring Provider (PT): Jacquelin Hawking   Encounter Date: 06/13/2020   PT End of Session - 06/13/20 1110    Visit Number 6    Number of Visits 12    Date for PT Re-Evaluation 07/06/20    Authorization Type [employer] discount    Progress Note Due on Visit 10    PT Start Time 1045    PT Stop Time 1130    PT Time Calculation (min) 45 min    Equipment Utilized During Treatment Gait belt    Activity Tolerance Patient tolerated treatment well;Patient limited by fatigue    Behavior During Therapy [facility] for tasks assessed/performed           Past Medical History:  Diagnosis Date  . Depression   . Gout   . Hypertension   . Stroke [facility]) 2005 and 02/2019    No past surgical history on file.  There were no vitals filed for this visit.   Subjective Assessment - 06/13/20 1310    Subjective PT states that he tries to complete his HEP, however it is hard.  States that he lives with people who feel that he should just stay in a wheelchair as it would be easier.    Pertinent History CVA 2005/ and 02/2019.    Limitations Standing;Lifting;Walking;House hold activities;Sitting    How long can you sit comfortably? Pt states that if he sits too long it is difficult to get his legs working, ( about an hour)    How long can you stand comfortably? 30 minutes    How long can you walk comfortably? walks with a quad cane for about 45 minutes pt walks very slowly    Patient Stated Goals Pt would like to be abot to function without fear    Currently in Pain? No/denies                             [facility] Adult PT Treatment/Exercise - 06/13/20 0001      Knee/Hip Exercises: Standing   Heel Raises 2 sets;10 reps   on incline   Heel Raises  Limitations toe raises on incline 2x10    Forward Lunges Both;15 reps;5 seconds;Limitations    Forward Lunges Limitations 4" step     Hip Abduction Both;2 sets;10 reps;Knee straight    Hip Extension Stengthening;Right;Left;2 sets;10 reps;Knee straight    Forward Step Up Right;Left;1 set;10 reps;Hand Hold: 2;Step Height: 6"    Functional Squat 1 set;10 reps    Functional Squat Limitations BUE assist    Other Standing Knee Exercises tandem stance 3 x 20"     Other Standing Knee Exercises Sidestepping 4 RT with intermittent HHA      Knee/Hip Exercises: Seated   Sit to Sand 1 set;10 reps;without UE support                    PT Short Term Goals - 06/06/20 1029      PT SHORT TERM GOAL #1   Title PT to be I in a HEP    Time 3    Period Weeks    Status On-going    Target Date 06/15/20      PT SHORT TERM GOAL #2  Title PT to be able to single leg stance on both LE for 10 seconds to decrease risk of falling    Time 3    Period Weeks    Status On-going             PT Long Term Goals - 06/06/20 1029      PT LONG TERM GOAL #1   Title PT to be I in an advance HEP    Period Weeks    Status On-going      PT LONG TERM GOAL #2   Title PT to be able to single leg stance for 20 seconds each to reduce risk of falling.    Time 6    Period Weeks    Status On-going      PT LONG TERM GOAL #3   Title PT strength to increase one grade in core and LE to allow pt to come sit to stand without use of UE    Time 6    Period Weeks    Status On-going                 Plan - 06/13/20 1307    Clinical Impression Statement PT first 2/3 of treatment completed by Verne Carrow PT.  Continue to focus on funcitonal strengthening adding lunging to program and sit to stand from chair instead of raised level.  PT continues to be motivated with therapy.    Personal Factors and Comorbidities Comorbidity 2;Fitness;Social Background;Time since onset of injury/illness/exacerbation     Comorbidities obesity, HTN, CVA,    Examination-Activity Limitations Carry;Dressing;Locomotion Level;Squat;Stairs;Stand    Examination-Participation Restrictions Cleaning;Community Activity;Driving;Laundry;Meal Prep;Yard Work;Shop    Stability/Clinical Decision Making Stable/Uncomplicated    Rehab Potential Good    PT Frequency 2x / week    PT Duration 6 weeks    PT Treatment/Interventions Balance training;Therapeutic exercise;Therapeutic activities;Gait training;Stair training;Functional mobility training;Neuromuscular re-education;Patient/family education    PT Next Visit Plan Continue balance activity and progress standing low level activities. Increase step-up height as able. . Add SLS w/ vectors as able.    PT Home Exercise Plan sit to stand, bridge and sitting ankle pumps 05/28/20: seated march, LAQs    Consulted and Agree with Plan of Care Patient           Patient will benefit from skilled therapeutic intervention in order to improve the following deficits and impairments:  Abnormal gait, Decreased activity tolerance, Decreased balance, Decreased mobility, Difficulty walking, Obesity, Decreased strength  Visit Diagnosis: Other abnormalities of gait and mobility  Muscle weakness (generalized)  Repeated falls     Problem List Patient Active Problem List   Diagnosis Date Noted  . Hyperlipidemia 05/18/2019  . Diabetes mellitus without complication (HCC) 05/18/2019  . Tobacco use disorder 05/18/2019  . Essential hypertension 03/28/2019    Virgina Organ, PT CLT [phone] 06/13/2020, 1:12 PM  [facility] [address] [place], [place], [address] Phone: [phone]   Fax:  [phone]  Name: Isaiah Ellis MRN: [id number] Date of Birth: [birth year]

## 2020-06-13 NOTE — Progress Notes (Signed)
BP 96/70   Pulse 67   Temp 98.1 F (36.7 C)   Ht 5' 10.25" (1.784 m)   Wt (!) 342 lb (155.1 kg)   SpO2 98%   BMI 48.72 kg/m    Subjective:    [name]: Isaiah Ellis, male    DOB: 10-18-1961, 59 y.o.   MRN: [id number]  HPI: Isaiah Ellis is a 59 y.o. male presenting on 06/13/2020 for Diabetes, Hyperlipidemia, and Hypertension   HPI    Pt had a negative covid 19 screening questionnaire.     Pt is 59yoM with routine appt for DM, HTN and reported history of CVA.  Pt has been going to PT lately due to weakness, gait problems, balance problems and frequent falls.  He says the PT is healping.  Pt says that if he can get back to walking regularly, he is planning to move to [place], hopefully within the next six months.    He is working on weight reduction by healthy eating and exercise.  He recently had MRI for these new balance problems.    Pt continues to smoke.       Relevant past medical, surgical, family and social history reviewed and updated as indicated. Interim medical history since our last visit reviewed. Allergies and medications reviewed and updated.   Current Outpatient Medications:  .  amLODipine (NORVASC) 10 MG tablet, TAKE 1 Tablet BY MOUTH ONCE EVERY DAY, Disp: 90 tablet, Rfl: 1 .  aspirin 325 MG tablet, Take 1 tablet (325 mg total) by mouth daily., Disp: 90 tablet, Rfl: 1 .  atorvastatin (LIPITOR) 20 MG tablet, TAKE 1 Tablet BY MOUTH ONCE EVERY DAY, Disp: 90 tablet, Rfl: 1 .  ibuprofen (ADVIL,MOTRIN) 200 MG tablet, Take 800 mg by mouth every 6 (six) hours as needed. Pain., Disp: , Rfl:  .  lisinopril (ZESTRIL) 20 MG tablet, TAKE 1 Tablet BY MOUTH ONCE EVERY DAY, Disp: 90 tablet, Rfl: 1 .  metFORMIN (GLUCOPHAGE) 500 MG tablet, TAKE 1 Tablet  BY MOUTH TWICE DAILY WITH A MEAL, Disp: 180 tablet, Rfl: 1 .  metoprolol tartrate (LOPRESSOR) 50 MG tablet, TAKE 1 Tablet  BY MOUTH TWICE DAILY, Disp: 180 tablet, Rfl: 0     Review of Systems  Per HPI unless  specifically indicated above     Objective:    BP 96/70   Pulse 67   Temp 98.1 F (36.7 C)   Ht 5' 10.25" (1.784 m)   Wt (!) 342 lb (155.1 kg)   SpO2 98%   BMI 48.72 kg/m   Wt Readings from Last 3 Encounters:  06/13/20 (!) 342 lb (155.1 kg)  05/01/20 (!) 344 lb (156 kg)  12/14/19 (!) 356 lb (161.5 kg)    Physical Exam Vitals reviewed.  Constitutional:      General: He is not in acute distress.    Appearance: He is well-developed. He is obese. He is not toxic-appearing.  HENT:     Head: Normocephalic and atraumatic.  Cardiovascular:     Rate and Rhythm: Normal rate and regular rhythm.  Pulmonary:     Effort: Pulmonary effort is normal.     Breath sounds: Normal breath sounds. No wheezing.  Abdominal:     General: Bowel sounds are normal.     Palpations: Abdomen is soft.     Tenderness: There is no abdominal tenderness.  Musculoskeletal:     Cervical back: Neck supple.     Right lower leg: No edema.  Left lower leg: No edema.  Lymphadenopathy:     Cervical: No cervical adenopathy.  Skin:    General: Skin is warm and dry.  Neurological:     Mental Status: He is alert and oriented to person, place, and time.     Gait: Gait abnormal.  Psychiatric:        Behavior: Behavior normal.     Results for orders placed or performed during the hospital encounter of 06/05/20  Hemoglobin A1c  Result Value Ref Range   Hgb A1c MFr Bld 6.2 (H) 4.8 - 5.6 %   Mean Plasma Glucose 131.24 mg/dL  PSA  Result Value Ref Range   Prostatic Specific Antigen 0.77 0.00 - 4.00 ng/mL  Lipid panel  Result Value Ref Range   Cholesterol 165 0 - 200 mg/dL   Triglycerides 546 <270 mg/dL   HDL 54 >35 mg/dL   Total CHOL/HDL Ratio 3.1 RATIO   VLDL 25 0 - 40 mg/dL   LDL Cholesterol 86 0 - 99 mg/dL  Comprehensive metabolic panel  Result Value Ref Range   Sodium 140 135 - 145 mmol/L   Potassium 3.9 3.5 - 5.1 mmol/L   Chloride 107 98 - 111 mmol/L   CO2 25 22 - 32 mmol/L   Glucose, Bld 93  70 - 99 mg/dL   BUN 12 6 - 20 mg/dL   Creatinine, Ser 0.09 0.61 - 1.24 mg/dL   Calcium 9.6 8.9 - 38.1 mg/dL   Total Protein 7.9 6.5 - 8.1 g/dL   Albumin 4.4 3.5 - 5.0 g/dL   AST 19 15 - 41 U/L   ALT 17 0 - 44 U/L   Alkaline Phosphatase 84 38 - 126 U/L   Total Bilirubin 0.6 0.3 - 1.2 mg/dL   GFR calc non Af Amer >60 >60 mL/min   GFR calc Af Amer >60 >60 mL/min   Anion gap 8 5 - 15      Assessment & Plan:    Encounter Diagnoses  Name Primary?  . Balance problem Yes  . Falling episodes   . Abnormal MRI of head   . Diabetes mellitus without complication (HCC)   . Essential hypertension   . Hyperlipidemia, unspecified hyperlipidemia type   . Tobacco use disorder   . Morbid obesity (HCC)   . History of completed stroke       -reviewed labs with pt -Pt to continue current medications except reduce  amlodipine to 5mg . -Pt to notify office if bp starts running high.   -Refer to [facility] for home bp monitoring program  -reviewed MRI results with pt -Refer to neurologist for balance problems and abnormal MRI -pt Has cafa/cone financial assistance  -encouraged smoking cessation -he is encouraged to continue weight loss efforts -pt to follow up here 6 wk to recheck bp.  He is to contact office sooner prn

## 2020-06-15 ENCOUNTER — Other Ambulatory Visit: Payer: Self-pay

## 2020-06-15 ENCOUNTER — Ambulatory Visit (HOSPITAL_COMMUNITY): Payer: Medicaid Other | Admitting: Physical Therapy

## 2020-06-15 ENCOUNTER — Encounter (HOSPITAL_COMMUNITY): Payer: Self-pay | Admitting: Physical Therapy

## 2020-06-15 DIAGNOSIS — M6281 Muscle weakness (generalized): Secondary | ICD-10-CM

## 2020-06-15 DIAGNOSIS — R2689 Other abnormalities of gait and mobility: Secondary | ICD-10-CM | POA: Diagnosis not present

## 2020-06-15 DIAGNOSIS — R296 Repeated falls: Secondary | ICD-10-CM

## 2020-06-15 NOTE — Therapy (Signed)
[facility] [address] [place], [place], [address] Phone: [phone]   Fax:  [phone]  Physical Therapy Treatment  Patient Details  Name: Isaiah Ellis MRN: [id number] Date of Birth: 12/07/1960 Referring Provider (PT): Jacquelin Hawking   Encounter Date: 06/15/2020   PT End of Session - 06/15/20 0958    Visit Number 7    Number of Visits 12    Date for PT Re-Evaluation 07/06/20    Authorization Type [employer] discount    Progress Note Due on Visit 10    PT Start Time 0952    PT Stop Time 1030    PT Time Calculation (min) 38 min    Equipment Utilized During Treatment Gait belt    Activity Tolerance Patient tolerated treatment well;Patient limited by fatigue    Behavior During Therapy [facility] for tasks assessed/performed           Past Medical History:  Diagnosis Date  . Depression   . Gout   . Hypertension   . Stroke [facility]) 2005 and 02/2019    History reviewed. No pertinent surgical history.  There were no vitals filed for this visit.   Subjective Assessment - 06/15/20 0956    Subjective Patient reported that he is doing well and denies any pain.    Pertinent History CVA 2005/ and 02/2019.    Limitations Standing;Lifting;Walking;House hold activities;Sitting    How long can you sit comfortably? Pt states that if he sits too long it is difficult to get his legs working, ( about an hour)    How long can you stand comfortably? 30 minutes    How long can you walk comfortably? walks with a quad cane for about 45 minutes pt walks very slowly    Patient Stated Goals Pt would like to be abot to function without fear    Currently in Pain? No/denies                             [facility] Adult PT Treatment/Exercise - 06/15/20 0001      Ambulation/Gait   Ambulation/Gait Yes    Ambulation/Gait Assistance 5: Supervision    Ambulation Distance (Feet) 150 Feet    Assistive device Small based quad cane    Gait Pattern  Decreased dorsiflexion - right;Decreased dorsiflexion - left;Poor foot clearance - left;Poor foot clearance - right    Gait Comments Cues for improved foot clearance and for heel to toe gait      Knee/Hip Exercises: Standing   Heel Raises 2 sets;10 reps   on incline   Heel Raises Limitations toe raises on incline 2x10    Forward Lunges Both;15 reps;5 seconds;Limitations    Forward Lunges Limitations 4" step     Hip Abduction Both;2 sets;10 reps;Knee straight    Hip Extension Stengthening;Right;Left;2 sets;10 reps;Knee straight    Other Standing Knee Exercises Sidestepping 2 RT with HHA with RTB                    PT Short Term Goals - 06/06/20 1029      PT SHORT TERM GOAL #1   Title PT to be I in a HEP    Time 3    Period Weeks    Status On-going    Target Date 06/15/20      PT SHORT TERM GOAL #2   Title PT to be able to single leg stance on both LE  for 10 seconds to decrease risk of falling    Time 3    Period Weeks    Status On-going             PT Long Term Goals - 06/06/20 1029      PT LONG TERM GOAL #1   Title PT to be I in an advance HEP    Period Weeks    Status On-going      PT LONG TERM GOAL #2   Title PT to be able to single leg stance for 20 seconds each to reduce risk of falling.    Time 6    Period Weeks    Status On-going      PT LONG TERM GOAL #3   Title PT strength to increase one grade in core and LE to allow pt to come sit to stand without use of UE    Time 6    Period Weeks    Status On-going                 Plan - 06/15/20 1029    Clinical Impression Statement Included gait training this session. Patient ambulates with foot flat and decreased dorsiflexion bilaterally. Cued patient to pick up feet more and to perform heel-strike. Patient required significant cueing for this, and demonstrated inconsistent carry-over. Plan to continue to incorporate gait training throughout. This session added theraband for sidestepping  exercise in the // bars. Decreased repetitions with this due to challenge with theraband.    Personal Factors and Comorbidities Comorbidity 2;Fitness;Social Background;Time since onset of injury/illness/exacerbation    Comorbidities obesity, HTN, CVA,    Examination-Activity Limitations Carry;Dressing;Locomotion Level;Squat;Stairs;Stand    Examination-Participation Restrictions Cleaning;Community Activity;Driving;Laundry;Meal Prep;Yard Work;Shop    Stability/Clinical Decision Making Stable/Uncomplicated    Rehab Potential Good    PT Frequency 2x / week    PT Duration 6 weeks    PT Treatment/Interventions Balance training;Therapeutic exercise;Therapeutic activities;Gait training;Stair training;Functional mobility training;Neuromuscular re-education;Patient/family education    PT Next Visit Plan Continue balance activity and progress standing low level activities. Increase step-up height as able. . Add SLS w/ vectors as able.    PT Home Exercise Plan sit to stand, bridge and sitting ankle pumps 05/28/20: seated march, LAQs    Consulted and Agree with Plan of Care Patient           Patient will benefit from skilled therapeutic intervention in order to improve the following deficits and impairments:  Abnormal gait, Decreased activity tolerance, Decreased balance, Decreased mobility, Difficulty walking, Obesity, Decreased strength  Visit Diagnosis: Other abnormalities of gait and mobility  Muscle weakness (generalized)  Repeated falls     Problem List Patient Active Problem List   Diagnosis Date Noted  . Hyperlipidemia 05/18/2019  . Diabetes mellitus without complication (HCC) 05/18/2019  . Tobacco use disorder 05/18/2019  . Essential hypertension 03/28/2019   Verne Carrow PT, DPT 10:31 AM, 06/15/20 [phone]  [facility] [address] [place], [place], [address] Phone: [phone]   Fax:  [phone]  Name: Isaiah Ellis MRN:  [id number] Date of Birth: [birth year]

## 2020-06-19 ENCOUNTER — Ambulatory Visit (HOSPITAL_COMMUNITY): Payer: Medicaid Other | Attending: Physician Assistant | Admitting: Physical Therapy

## 2020-06-19 ENCOUNTER — Other Ambulatory Visit: Payer: Self-pay

## 2020-06-19 DIAGNOSIS — R296 Repeated falls: Secondary | ICD-10-CM | POA: Diagnosis present

## 2020-06-19 DIAGNOSIS — M6281 Muscle weakness (generalized): Secondary | ICD-10-CM | POA: Insufficient documentation

## 2020-06-19 DIAGNOSIS — R2689 Other abnormalities of gait and mobility: Secondary | ICD-10-CM | POA: Insufficient documentation

## 2020-06-19 NOTE — Therapy (Signed)
[place] [facility] [address] [place], [place], [address] Phone: [phone]   Fax:  [phone]  Physical Therapy Treatment  Patient Details  Name: Isaiah Ellis MRN: [id number] Date of Birth: Nov 23, 1960 Referring Provider (PT): Jacquelin Hawking   Encounter Date: 06/19/2020   PT End of Session - 06/19/20 1311    Visit Number 8    Number of Visits 12    Date for PT Re-Evaluation 07/06/20    Authorization Type cone discount    Progress Note Due on Visit 10    PT Start Time 1134    PT Stop Time 1220    PT Time Calculation (min) 46 min    Equipment Utilized During Treatment Gait belt    Activity Tolerance Patient tolerated treatment well;Patient limited by fatigue    Behavior During Therapy [facility] for tasks assessed/performed           Past Medical History:  Diagnosis Date  . Depression   . Gout   . Hypertension   . Stroke [facility]) 2005 and 02/2019    No past surgical history on file.  There were no vitals filed for this visit.   Subjective Assessment - 06/19/20 1202    Subjective pt states he is trying to get out of his cousins house because it is not ideal living situations.  Pt states he is working on using his NBQC at home.                             [facility] Adult PT Treatment/Exercise - 06/19/20 0001      Ambulation/Gait   Ambulation/Gait Yes    Ambulation/Gait Assistance 5: Supervision    Ambulation Distance (Feet) 200 Feet    Assistive device Small based quad cane    Gait Pattern Decreased dorsiflexion - right;Decreased dorsiflexion - left;Poor foot clearance - left;Poor foot clearance - right    Gait Comments Cues for improved foot clearance and for heel to toe gait      Knee/Hip Exercises: Standing   Heel Raises 2 sets;15 reps    Heel Raises Limitations toe raises on incline 2x10    Forward Lunges Both;15 reps;5 seconds;Limitations    Forward Lunges Limitations 4" step     Hip Abduction Both;2  sets;Knee straight;15 reps    Hip Extension Both;2 sets;Knee straight;15 reps      Knee/Hip Exercises: Seated   Sit to Sand 1 set;10 reps;without UE support                  PT Education - 06/19/20 1305    Education Details educated on live bug protocol and will need to change clothes upon arrival and treated in a room    Person(s) Educated Patient    Methods Explanation    Comprehension Verbalized understanding            PT Short Term Goals - 06/06/20 1029      PT SHORT TERM GOAL #1   Title PT to be I in a HEP    Time 3    Period Weeks    Status On-going    Target Date 06/15/20      PT SHORT TERM GOAL #2   Title PT to be able to single leg stance on both LE for 10 seconds to decrease risk of falling    Time 3    Period Weeks    Status On-going  PT Long Term Goals - 06/06/20 1029      PT LONG TERM GOAL #1   Title PT to be I in an advance HEP    Period Weeks    Status On-going      PT LONG TERM GOAL #2   Title PT to be able to single leg stance for 20 seconds each to reduce risk of falling.    Time 6    Period Weeks    Status On-going      PT LONG TERM GOAL #3   Title PT strength to increase one grade in core and LE to allow pt to come sit to stand without use of UE    Time 6    Period Weeks    Status On-going                 Plan - 06/19/20 1216    Clinical Impression Statement Completed 2 sets of 15 reps of all exercises this session.  Pt still with extreme difficulty sequencing NBQC properly, taking larger steps and using on correct side (needs to use on Lt side).  pt only required 2 seated rest breaks during session today.    Personal Factors and Comorbidities Comorbidity 2;Fitness;Social Background;Time since onset of injury/illness/exacerbation    Comorbidities obesity, HTN, CVA,    Examination-Activity Limitations Carry;Dressing;Locomotion Level;Squat;Stairs;Stand    Examination-Participation Restrictions  Cleaning;Community Activity;Driving;Laundry;Meal Prep;Yard Work;Shop    Stability/Clinical Decision Making Stable/Uncomplicated    Rehab Potential Good    PT Frequency 2x / week    PT Duration 6 weeks    PT Treatment/Interventions Balance training;Therapeutic exercise;Therapeutic activities;Gait training;Stair training;Functional mobility training;Neuromuscular re-education;Patient/family education    PT Next Visit Plan Continue balance activity and progress standing low level activities. Increase step-up height as able. . Add SLS w/ vectors as able.  Complete all sessions moving foward in treatment room #2    PT Home Exercise Plan sit to stand, bridge and sitting ankle pumps 05/28/20: seated march, LAQs    Consulted and Agree with Plan of Care Patient           Patient will benefit from skilled therapeutic intervention in order to improve the following deficits and impairments:  Abnormal gait, Decreased activity tolerance, Decreased balance, Decreased mobility, Difficulty walking, Obesity, Decreased strength  Visit Diagnosis: Other abnormalities of gait and mobility  Muscle weakness (generalized)  Repeated falls     Problem List Patient Active Problem List   Diagnosis Date Noted  . Hyperlipidemia 05/18/2019  . Diabetes mellitus without complication (HCC) 05/18/2019  . Tobacco use disorder 05/18/2019  . Essential hypertension 03/28/2019   Lurena Nida, PTA/CLT [phone]  Lurena Nida 06/19/2020, 1:13 PM  [facility] [address] [place], [place], [address] Phone: [phone]   Fax:  [phone]  Name: Isaiah Ellis MRN: [id number] Date of Birth: Aug 18, 1961

## 2020-06-21 ENCOUNTER — Other Ambulatory Visit: Payer: Self-pay

## 2020-06-21 ENCOUNTER — Ambulatory Visit (HOSPITAL_COMMUNITY): Payer: Medicaid Other | Admitting: Physical Therapy

## 2020-06-21 DIAGNOSIS — R296 Repeated falls: Secondary | ICD-10-CM

## 2020-06-21 DIAGNOSIS — R2689 Other abnormalities of gait and mobility: Secondary | ICD-10-CM | POA: Diagnosis not present

## 2020-06-21 DIAGNOSIS — M6281 Muscle weakness (generalized): Secondary | ICD-10-CM

## 2020-06-21 NOTE — Therapy (Signed)
[facility] [address] [place], [place], [address] Phone: [phone]   Fax:  [phone]  Physical Therapy Treatment  Patient Details  Name: Isaiah Ellis MRN: [id number] Date of Birth: 09/27/1961 Referring Provider (PT): Jacquelin Hawking   Encounter Date: 06/21/2020   PT End of Session - 06/21/20 1351    Visit Number 9    Number of Visits 12    Date for PT Re-Evaluation 07/06/20    Authorization Type [employer]    Progress Note Due on Visit 10    PT Start Time 1304    PT Stop Time 1348    PT Time Calculation (min) 44 min    Equipment Utilized During Treatment Gait belt    Activity Tolerance Patient tolerated treatment well;Patient limited by fatigue    Behavior During Therapy [facility] for tasks assessed/performed           Past Medical History:  Diagnosis Date  . Depression   . Gout   . Hypertension   . Stroke [facility]) 2005 and 02/2019    No past surgical history on file.  There were no vitals filed for this visit.   Subjective Assessment - 06/21/20 1350    Subjective Pt states he is trying to do his exercises at home.  Reports no pain or issues today.    Currently in Pain? No/denies                             [facility] Adult PT Treatment/Exercise - 06/21/20 0001      Knee/Hip Exercises: Standing   Heel Raises 2 sets;15 reps    Heel Raises Limitations toe raises on incline 2x10    Hip Abduction Both;2 sets;Knee straight;15 reps    Hip Extension Both;2 sets;Knee straight;15 reps      Knee/Hip Exercises: Seated   Sit to Sand 1 set;10 reps;without UE support      Knee/Hip Exercises: Supine   Bridges 3 sets;10 reps;Limitations    Straight Leg Raises Right;Left;2 sets;10 reps                    PT Short Term Goals - 06/06/20 1029      PT SHORT TERM GOAL #1   Title PT to be I in a HEP    Time 3    Period Weeks    Status On-going    Target Date 06/15/20      PT SHORT TERM GOAL #2    Title PT to be able to single leg stance on both LE for 10 seconds to decrease risk of falling    Time 3    Period Weeks    Status On-going             PT Long Term Goals - 06/06/20 1029      PT LONG TERM GOAL #1   Title PT to be I in an advance HEP    Period Weeks    Status On-going      PT LONG TERM GOAL #2   Title PT to be able to single leg stance for 20 seconds each to reduce risk of falling.    Time 6    Period Weeks    Status On-going      PT LONG TERM GOAL #3   Title PT strength to increase one grade in core and LE to allow pt to come sit to stand without  use of UE    Time 6    Period Weeks    Status On-going                 Plan - 06/21/20 1352    Clinical Impression Statement Treatment completed in isolated room this session.  Focused on strengthening of bil LE's and gluteal mm.  Added mat exercises with particular difficulty noted with both bridging and SLR.  Able to complete 2-3 sets of each exercises.    Personal Factors and Comorbidities Comorbidity 2;Fitness;Social Background;Time since onset of injury/illness/exacerbation    Comorbidities obesity, HTN, CVA,    Examination-Activity Limitations Carry;Dressing;Locomotion Level;Squat;Stairs;Stand    Examination-Participation Restrictions Cleaning;Community Activity;Driving;Laundry;Meal Prep;Yard Work;Shop    Stability/Clinical Decision Making Stable/Uncomplicated    Rehab Potential Good    PT Frequency 2x / week    PT Duration 6 weeks    PT Treatment/Interventions Balance training;Therapeutic exercise;Therapeutic activities;Gait training;Stair training;Functional mobility training;Neuromuscular re-education;Patient/family education    PT Next Visit Plan Continue balance activity and progress functional strengthening and gait.  Complete all sessions moving foward in treatment room #2.  Complete gait outdoors either at beginning of session, weather permitting. complete 10th visit PN next session.    PT Home  Exercise Plan sit to stand, bridge and sitting ankle pumps 05/28/20: seated march, LAQs    Consulted and Agree with Plan of Care Patient           Patient will benefit from skilled therapeutic intervention in order to improve the following deficits and impairments:  Abnormal gait, Decreased activity tolerance, Decreased balance, Decreased mobility, Difficulty walking, Obesity, Decreased strength  Visit Diagnosis: Other abnormalities of gait and mobility  Repeated falls  Muscle weakness (generalized)     Problem List Patient Active Problem List   Diagnosis Date Noted  . Hyperlipidemia 05/18/2019  . Diabetes mellitus without complication (HCC) 05/18/2019  . Tobacco use disorder 05/18/2019  . Essential hypertension 03/28/2019   Isaiah Ellis, PTA/CLT [phone]  Isaiah Ellis 06/21/2020, 1:56 PM  [facility] [address] [place], [place], [address] Phone: [phone]   Fax:  [phone]  Name: Isaiah Ellis MRN: [id number] Date of Birth: [birth year]

## 2020-06-26 ENCOUNTER — Telehealth (HOSPITAL_COMMUNITY): Payer: Self-pay | Admitting: Physical Therapy

## 2020-06-26 ENCOUNTER — Ambulatory Visit (HOSPITAL_COMMUNITY): Payer: Medicaid Other | Admitting: Physical Therapy

## 2020-06-26 NOTE — Telephone Encounter (Signed)
Pt called they have moved to a homeless shelter in [place] and need some time to work thing out. They do not want to give up on PT and they do not want [place] to d/c them. They will continue once they work things out -they plan to be here on 8/17.

## 2020-06-28 ENCOUNTER — Ambulatory Visit (HOSPITAL_COMMUNITY): Payer: Medicaid Other | Admitting: Physical Therapy

## 2020-07-03 ENCOUNTER — Encounter (HOSPITAL_COMMUNITY): Payer: Self-pay | Admitting: Physical Therapy

## 2020-07-04 ENCOUNTER — Telehealth (HOSPITAL_COMMUNITY): Payer: Self-pay | Admitting: Physical Therapy

## 2020-07-04 NOTE — Telephone Encounter (Signed)
Called regarding patient not showing for appointment 07/03/20. There was no answer, so left message reminding of next scheduled appointment and that it is his last scheduled appointment. Also provided clinic phone number and asked that patient call to cancel appointment if unable to attend.    Verne Carrow PT, DPT 11:29 AM, 07/04/20 [phone]

## 2020-07-05 ENCOUNTER — Telehealth (HOSPITAL_COMMUNITY): Payer: Self-pay | Admitting: Specialist

## 2020-07-05 ENCOUNTER — Ambulatory Visit (HOSPITAL_COMMUNITY): Payer: Medicaid Other | Admitting: Physical Therapy

## 2020-07-05 NOTE — Telephone Encounter (Signed)
Isaiah Ellis called to state he does not have transportation to appts with [place] at [facility] - I called patient back to discuss with him and left him a message to call us back to discuss future appts and to determine if we can assist with transportation. Shirlean Mylar, MHA, OTR/L [phone]

## 2020-07-09 ENCOUNTER — Ambulatory Visit: Payer: Self-pay | Admitting: Diagnostic Neuroimaging

## 2020-07-10 ENCOUNTER — Emergency Department (HOSPITAL_COMMUNITY)
Admission: EM | Admit: 2020-07-10 | Discharge: 2020-07-10 | Disposition: A | Discharge status: Home / Self Care | Payer: Medicaid Other | Attending: Emergency Medicine | Admitting: Emergency Medicine

## 2020-07-10 ENCOUNTER — Emergency Department (HOSPITAL_COMMUNITY): Payer: Medicaid Other

## 2020-07-10 ENCOUNTER — Other Ambulatory Visit: Payer: Self-pay

## 2020-07-10 ENCOUNTER — Telehealth: Payer: Self-pay

## 2020-07-10 ENCOUNTER — Encounter (HOSPITAL_COMMUNITY): Payer: Self-pay

## 2020-07-10 DIAGNOSIS — Z79899 Other long term (current) drug therapy: Secondary | ICD-10-CM | POA: Diagnosis not present

## 2020-07-10 DIAGNOSIS — E119 Type 2 diabetes mellitus without complications: Secondary | ICD-10-CM | POA: Diagnosis not present

## 2020-07-10 DIAGNOSIS — F17210 Nicotine dependence, cigarettes, uncomplicated: Secondary | ICD-10-CM | POA: Insufficient documentation

## 2020-07-10 DIAGNOSIS — I10 Essential (primary) hypertension: Secondary | ICD-10-CM | POA: Diagnosis not present

## 2020-07-10 DIAGNOSIS — Z7984 Long term (current) use of oral hypoglycemic drugs: Secondary | ICD-10-CM | POA: Diagnosis not present

## 2020-07-10 DIAGNOSIS — R270 Ataxia, unspecified: Secondary | ICD-10-CM | POA: Insufficient documentation

## 2020-07-10 DIAGNOSIS — Z7982 Long term (current) use of aspirin: Secondary | ICD-10-CM | POA: Diagnosis not present

## 2020-07-10 DIAGNOSIS — Z8673 Personal history of transient ischemic attack (TIA), and cerebral infarction without residual deficits: Secondary | ICD-10-CM | POA: Diagnosis not present

## 2020-07-10 DIAGNOSIS — R479 Unspecified speech disturbances: Secondary | ICD-10-CM | POA: Diagnosis not present

## 2020-07-10 LAB — URINALYSIS, ROUTINE W REFLEX MICROSCOPIC
Bilirubin Urine: NEGATIVE
Glucose, UA: NEGATIVE mg/dL
Hgb urine dipstick: NEGATIVE
Ketones, ur: NEGATIVE mg/dL
Leukocytes,Ua: NEGATIVE
Nitrite: NEGATIVE
Protein, ur: NEGATIVE mg/dL
Specific Gravity, Urine: 1.011 (ref 1.005–1.030)
pH: 7 (ref 5.0–8.0)

## 2020-07-10 LAB — COMPREHENSIVE METABOLIC PANEL
ALT: 14 U/L (ref 0–44)
AST: 16 U/L (ref 15–41)
Albumin: 4 g/dL (ref 3.5–5.0)
Alkaline Phosphatase: 74 U/L (ref 38–126)
Anion gap: 9 (ref 5–15)
BUN: 10 mg/dL (ref 6–20)
CO2: 23 mmol/L (ref 22–32)
Calcium: 9.1 mg/dL (ref 8.9–10.3)
Chloride: 106 mmol/L (ref 98–111)
Creatinine, Ser: 0.97 mg/dL (ref 0.61–1.24)
GFR calc Af Amer: >60 mL/min (ref >60)
GFR calc non Af Amer: >60 mL/min (ref >60)
Glucose, Bld: 93 mg/dL (ref 70–99)
Potassium: 3.9 mmol/L (ref 3.5–5.1)
Sodium: 138 mmol/L (ref 135–145)
Total Bilirubin: 0.4 mg/dL (ref 0.3–1.2)
Total Protein: 7.2 g/dL (ref 6.5–8.1)

## 2020-07-10 LAB — CBC
HCT: 42.5 % (ref 39.0–52.0)
Hemoglobin: 13.3 g/dL (ref 13.0–17.0)
MCH: 24.7 pg — ABNORMAL LOW (ref 26.0–34.0)
MCHC: 31.3 g/dL (ref 30.0–36.0)
MCV: 78.8 fL — ABNORMAL LOW (ref 80.0–100.0)
Platelets: 297 10*3/uL (ref 150–400)
RBC: 5.39 MIL/uL (ref 4.22–5.81)
RDW: 16.6 % — ABNORMAL HIGH (ref 11.5–15.5)
WBC: 6.7 10*3/uL (ref 4.0–10.5)
nRBC: 0 % (ref 0.0–0.2)

## 2020-07-10 LAB — RAPID URINE DRUG SCREEN, HOSP PERFORMED
Amphetamines: NOT DETECTED
Barbiturates: NOT DETECTED
Benzodiazepines: NOT DETECTED
Cocaine: NOT DETECTED
Opiates: NOT DETECTED
Tetrahydrocannabinol: POSITIVE — AB

## 2020-07-10 LAB — I-STAT CHEM 8, ED
BUN: 9 mg/dL (ref 6–20)
Calcium, Ion: 1.31 mmol/L (ref 1.15–1.40)
Chloride: 106 mmol/L (ref 98–111)
Creatinine, Ser: 1 mg/dL (ref 0.61–1.24)
Glucose, Bld: 92 mg/dL (ref 70–99)
HCT: 41 % (ref 39.0–52.0)
Hemoglobin: 13.9 g/dL (ref 13.0–17.0)
Potassium: 3.9 mmol/L (ref 3.5–5.1)
Sodium: 143 mmol/L (ref 135–145)
TCO2: 26 mmol/L (ref 22–32)

## 2020-07-10 LAB — DIFFERENTIAL
Abs Immature Granulocytes: 0.03 10*3/uL (ref 0.00–0.07)
Basophils Absolute: 0.1 10*3/uL (ref 0.0–0.1)
Basophils Relative: 1 %
Eosinophils Absolute: 0.3 10*3/uL (ref 0.0–0.5)
Eosinophils Relative: 4 %
Immature Granulocytes: 1 %
Lymphocytes Relative: 26 %
Lymphs Abs: 1.7 10*3/uL (ref 0.7–4.0)
Monocytes Absolute: 0.9 10*3/uL (ref 0.1–1.0)
Monocytes Relative: 13 %
Neutro Abs: 3.7 10*3/uL (ref 1.7–7.7)
Neutrophils Relative %: 55 %

## 2020-07-10 LAB — PROTIME-INR
INR: 0.9 (ref 0.8–1.2)
Prothrombin Time: 12.2 seconds (ref 11.4–15.2)

## 2020-07-10 LAB — APTT: aPTT: 30 seconds (ref 24–36)

## 2020-07-10 LAB — CBG MONITORING, ED: Glucose-Capillary: 101 mg/dL — ABNORMAL HIGH (ref 70–99)

## 2020-07-10 LAB — ETHANOL: Alcohol, Ethyl (B): <10 mg/dL (ref <10)

## 2020-07-10 NOTE — Discharge Instructions (Addendum)
Your appointment to follow-up with [facility] neurology as scheduled.  Today's work-up showed no evidence of any new or acute stroke.  Return for any new or worse symptoms.

## 2020-07-10 NOTE — ED Triage Notes (Signed)
Pt reports he went to bed at 10pm, woke up around 0030 to use the bathroom and was dizzy.  Reports went back to bed and woke up around 0500 and was "stuttering."  Reports was feeling worse and says noticed he was leaning to the left side more than usual.  Reports history of stroke and has residual r sided weakness.  EDP in triage with pt.

## 2020-07-10 NOTE — ED Provider Notes (Signed)
[facility] EMERGENCY DEPARTMENT Provider Note   CSN: [id number] Arrival date & time: 07/10/20  0947     History Chief Complaint  Patient presents with  . Aphasia    Isaiah Ellis is a 59 y.o. male.  Patient stays at a shelter.  Patient states he went to bed everything was fine at 10 PM.  He woke up at about 1230 shortly after midnight and noticed that he was dizzy denied true room spinning.  Went back to bed woke up around 5 in the morning noted that he was having some stuttering or some speech problems.  And then they also noticed that he was leaning more to his left side than usual.  Patient reported a history of stroke and residual right-sided weakness.  Patient was brought in by POV.  Past medical history significant for hypertension and stroke.        Past Medical History:  Diagnosis Date  . Depression   . Gout   . Hypertension   . Stroke [facility]) 2005 and 02/2019    Patient Active Problem List   Diagnosis Date Noted  . Hyperlipidemia 05/18/2019  . Diabetes mellitus without complication (HCC) 05/18/2019  . Tobacco use disorder 05/18/2019  . Essential hypertension 03/28/2019    History reviewed. No pertinent surgical history.     Family History  Problem Relation Age of Onset  . Hypertension Mother   . Stroke Mother   . Heart disease Father   . Stroke Father   . Hypertension Father     Social History   Tobacco Use  . Smoking status: Current Every Day Smoker    Packs/day: 0.25    Years: 40.00    Pack years: 10.00    Types: Cigarettes  . Smokeless tobacco: Never Used  Vaping Use  . Vaping Use: Never used  Substance Use Topics  . Alcohol use: Not Currently    Comment: occasional  . Drug use: Not Currently    Types: Marijuana, Cocaine    Comment: couple of months, no cocaine    Home Medications Prior to Admission medications   Medication Sig Start Date End Date Taking? Authorizing Provider  amLODipine (NORVASC) 5 MG tablet Take 1 tablet (5 mg  total) by mouth daily. 06/13/20   Jacquelin Hawking, PA-C  aspirin 325 MG tablet Take 1 tablet (325 mg total) by mouth daily. 05/07/20   Jacquelin Hawking, PA-C  atorvastatin (LIPITOR) 20 MG tablet TAKE 1 Tablet BY MOUTH ONCE EVERY DAY 03/29/20   Jacquelin Hawking, PA-C  ibuprofen (ADVIL,MOTRIN) 200 MG tablet Take 800 mg by mouth every 6 (six) hours as needed. Pain.    [provider]  lisinopril (ZESTRIL) 20 MG tablet TAKE 1 Tablet BY MOUTH ONCE EVERY DAY 03/29/20   Jacquelin Hawking, PA-C  metFORMIN (GLUCOPHAGE) 500 MG tablet TAKE 1 Tablet  BY MOUTH TWICE DAILY WITH A MEAL 03/29/20   Jacquelin Hawking, PA-C  metoprolol tartrate (LOPRESSOR) 50 MG tablet TAKE 1 Tablet  BY MOUTH TWICE DAILY 03/29/20   Jacquelin Hawking, PA-C    Allergies    Bee venom  Review of Systems   Review of Systems  Constitutional: Negative for chills and fever.  HENT: Negative for congestion, rhinorrhea and sore throat.   Eyes: Negative for visual disturbance.  Respiratory: Negative for cough and shortness of breath.   Cardiovascular: Negative for chest pain and leg swelling.  Gastrointestinal: Negative for abdominal pain, diarrhea, nausea and vomiting.  Genitourinary: Negative for dysuria.  Musculoskeletal: Negative for  back pain and neck pain.  Skin: Negative for rash.  Neurological: Positive for dizziness and speech difficulty. Negative for light-headedness and headaches.  Hematological: Does not bruise/bleed easily.  Psychiatric/Behavioral: Negative for confusion.    Physical Exam Updated Vital Signs BP 130/66   Pulse 66   Temp (!) 97.4 F (36.3 C) (Oral)   Resp 17   Ht 1.829 m (6')   Wt (!) 161 kg   SpO2 99%   BMI 48.15 kg/m   Physical Exam Vitals and nursing note reviewed.  Constitutional:      Appearance: Normal appearance. He is well-developed.  HENT:     Head: Normocephalic and atraumatic.  Eyes:     Extraocular Movements: Extraocular movements intact.     Conjunctiva/sclera: Conjunctivae  normal.     Pupils: Pupils are equal, round, and reactive to light.  Cardiovascular:     Rate and Rhythm: Normal rate and regular rhythm.     Heart sounds: No murmur heard.   Pulmonary:     Effort: Pulmonary effort is normal. No respiratory distress.     Breath sounds: Normal breath sounds.  Abdominal:     Palpations: Abdomen is soft.     Tenderness: There is no abdominal tenderness.  Musculoskeletal:        General: Normal range of motion.     Cervical back: Normal range of motion and neck supple.  Skin:    General: Skin is warm and dry.  Neurological:     Mental Status: He is alert and oriented to person, place, and time.     Cranial Nerves: Cranial nerve deficit present.     Sensory: No sensory deficit.     Motor: Weakness present.     Comments: Patient may be with some stuttering or a little bit of trouble finding words.  No significant motor weakness other than some generalized weakness on the right side which was old.  Call to ascertain no gait abnormalities.  Patient normally walks with a cane.  And also needs wheelchair assistance a lot.     ED Results / Procedures / Treatments   Labs (all labs ordered are listed, but only abnormal results are displayed) Labs Reviewed  CBC - Abnormal; Notable for the following components:      Result Value   MCV 78.8 (*)    MCH 24.7 (*)    RDW 16.6 (*)    All other components within normal limits  RAPID URINE DRUG SCREEN, HOSP PERFORMED - Abnormal; Notable for the following components:   Tetrahydrocannabinol POSITIVE (*)    All other components within normal limits  CBG MONITORING, ED - Abnormal; Notable for the following components:   Glucose-Capillary 101 (*)    All other components within normal limits  SARS CORONAVIRUS 2 BY RT PCR (HOSPITAL ORDER, PERFORMED IN  HOSPITAL LAB)  ETHANOL  PROTIME-INR  APTT  DIFFERENTIAL  COMPREHENSIVE METABOLIC PANEL  URINALYSIS, ROUTINE W REFLEX MICROSCOPIC  I-STAT CHEM 8, ED     EKG EKG Interpretation  Date/Time:  Tuesday July 10 2020 09:36:32 EDT Ventricular Rate:  68 PR Interval:    QRS Duration: 152 QT Interval:  445 QTC Calculation: 474 R Axis:   -75 Text Interpretation: Sinus rhythm RBBB and LAFB Artifact No significant change since last tracing Confirmed by Vanetta MuldersZackowski, Melecio Cueto [phone]) on 07/10/2020 10:09:03 AM   Radiology CT HEAD WO CONTRAST  Result Date: 07/10/2020 CLINICAL DATA:  Neuro deficit, acute, stroke suspected. EXAM: CT HEAD WITHOUT CONTRAST TECHNIQUE: Contiguous  axial images were obtained from the base of the skull through the vertex without intravenous contrast. COMPARISON:  MRI 06/05/2020, CT 02/28/2019 FINDINGS: Brain: No evidence of acute large vascular territory infarct. No acute hemorrhage. Small hypodensity in the right eccentric pons (series 2, image 9) likely was present on prior MRI but appears slightly more conspicuous on today's exam. Subtle abnormality in the left medulla seen on prior MRI is not well appreciated on this study. Additional patchy white matter hypoattenuation is nonspecific but most likely the sequela of chronic microvascular ischemic disease. No hydrocephalus. No mass lesion or abnormal mass effect. Partially empty sella. Similar mild diffuse cerebral volume loss. Vascular: Calcific intracranial atherosclerosis without focal hyperdense vessel. Skull: Normal. Negative for fracture or focal lesion. Sinuses/Orbits: No acute finding. Other: No mastoid effusion. IMPRESSION: 1. No acute hemorrhage. No evidence of acute large vascular territory infarct. Small hypodensity in the right eccentric pons (series 2, image 9) likely was present on prior MRI but appears slightly more conspicuous on today's exam. This may represent a remote lacunar infarct or dilated perivascular space; however, if there is concern for acute infarct then consider MRI to further evaluate given CT is relatively insensitive for this diagnosis in the first 24-48  hours. 2. Chronic microvascular ischemic disease and remote left basal ganglia lacunar infarct. Electronically Signed   By: Feliberto Harts MD   On: 07/10/2020 10:01   MR Brain Wo Contrast (neuro protocol)  Result Date: 07/10/2020 CLINICAL DATA:  Acute neuro deficit. Slurred speech and weakness 1 day. EXAM: MRI HEAD WITHOUT CONTRAST TECHNIQUE: Multiplanar, multiecho pulse sequences of the brain and surrounding structures were obtained without intravenous contrast. COMPARISON:  MRI head 06/05/2020 FINDINGS: Brain: Negative for acute infarct. Mild atrophy without hydrocephalus. Mild to moderate chronic microvascular ischemic change in the white matter bilaterally. Small chronic infarcts in the basal ganglia, pons, cerebellum bilaterally. Numerous foci of chronic microhemorrhage including in the cerebellum and cerebral hemispheres bilaterally and in the right thalamus. 7 mm hyperintensity left pons is unchanged. This has a somewhat rounded configuration. No restricted diffusion or hemorrhage. Vascular: Normal arterial flow voids. Skull and upper cervical spine: No focal skeletal lesion. Sinuses/Orbits: Mild mucosal edema paranasal sinuses. Negative orbit Other: None IMPRESSION: Negative for acute infarct. Chronic microvascular ischemic changes diffusely. Numerous foci of chronic microhemorrhage throughout the brain bilaterally likely due to chronic hypertensive insult. 7 mm round hyperintensity left pons is unchanged. Possible infarct. Neoplasm also in the differential. Recommend follow-up baseline MRI brain with contrast as well as follow-up scans to assure stability. Electronically Signed   By: Marlan Palau M.D.   On: 07/10/2020 12:50    Procedures Procedures (including critical care time)  CRITICAL CARE Performed by: Vanetta Mulders Total critical care time: 45 minutes Critical care time was exclusive of separately billable procedures and treating other patients. Critical care was necessary to  treat or prevent imminent or life-threatening deterioration. Critical care was time spent personally by me on the following activities: development of treatment plan with patient and/or surrogate as well as nursing, discussions with consultants, evaluation of patient's response to treatment, examination of patient, obtaining history from patient or surrogate, ordering and performing treatments and interventions, ordering and review of laboratory studies, ordering and review of radiographic studies, pulse oximetry and re-evaluation of patient's condition.   ED ECG REPORT     Medications Ordered in ED Medications - No data to display  ED Course  I have reviewed the triage vital signs and the nursing notes.  Pertinent labs & imaging results that were available during my care of the patient were reviewed by me and considered in my medical decision making (see chart for details).    MDM Rules/Calculators/A&P                          Patient presented with concerns for some speech difficulty and some ataxia or dizziness but no true room spinning.  She went to bed at 10 PM woke up around 1230 shortly after midnight and was dizzy.  Went back to bed woke up around 5 in the morning was having some speech difficulties.  Stated he was feeling worse and was leaning more to his left side than usual.  Patient reports a history of prior stroke and has residual right-sided weakness.  Based on this patient was out of the window for TPA.  His neuro symptoms on presentation were not extreme enough to be consistent with a VAN.    But stroke order set was used.  CT head without any acute findings MRI was done also no evidence of any acute findings.  Does have some old findings.  Consistent may be with hypertension.  Blood pressures are pretty reasonable here today.  Also has a pontine sort of mass.  Patient already has follow-up with [facility] neurology scheduled in the next few weeks.   Patient stable for  discharge home.  Will return for any new or worse symptoms.     Final Clinical Impression(s) / ED Diagnoses Final diagnoses:  Ataxia  Difficulty with speech    Rx / DC Orders ED Discharge Orders    None       Vanetta Mulders, MD 07/10/20 1332

## 2020-07-10 NOTE — Congregational Nurse Program (Signed)
[facility] for [facility] program. History of stroke in the past and provider had lowered one medication.  Client notified this RN today that he could not come in to enroll in program today with Maia Plan LPN.  Plan: Pauline Good LPN that client cannot come in today so she can follow up with him and arrange future enrollment in Derenda Mis remote patient monitoring program.    Francee Nodal RN  Clara Gunn/[facility]

## 2020-07-10 NOTE — Telephone Encounter (Signed)
Client texted this Case Manager this am at 840am.  Called client back by [phone]. Aunt got on the phone and stated that when she picked her nephew up this am she noticed his speech was slurred at times and he seemed to be leaning.  Client with past history of Stroke. Recommended that they call 911 to assess and take him to ER for evaluation. Aunt states she can transport she lives a "block away" again recommended he needs to be seen and to call 911. She states he will get to the ER now. Will plan to follow up with client later. Client is enrolled in [facility] program for uninsured and his [facility] Care is [facility].  Francee Nodal RN  [facility]

## 2020-07-25 ENCOUNTER — Other Ambulatory Visit: Payer: Self-pay

## 2020-07-25 ENCOUNTER — Ambulatory Visit: Payer: Self-pay | Admitting: Physician Assistant

## 2020-07-25 ENCOUNTER — Encounter: Payer: Self-pay | Admitting: Physician Assistant

## 2020-07-25 VITALS — BP 100/72 | HR 67 | Temp 97.9°F | Ht 70.25 in | Wt 341.5 lb

## 2020-07-25 DIAGNOSIS — E785 Hyperlipidemia, unspecified: Secondary | ICD-10-CM

## 2020-07-25 DIAGNOSIS — E119 Type 2 diabetes mellitus without complications: Secondary | ICD-10-CM

## 2020-07-25 DIAGNOSIS — F17200 Nicotine dependence, unspecified, uncomplicated: Secondary | ICD-10-CM

## 2020-07-25 DIAGNOSIS — I10 Essential (primary) hypertension: Secondary | ICD-10-CM

## 2020-07-25 DIAGNOSIS — Z23 Encounter for immunization: Secondary | ICD-10-CM

## 2020-07-25 MED ORDER — AMLODIPINE BESYLATE 5 MG PO TABS
5.0000 mg | ORAL_TABLET | Freq: Every day | ORAL | 0 refills | Status: DC
Start: 2020-07-25 — End: 2020-10-31

## 2020-07-25 MED ORDER — METFORMIN HCL 500 MG PO TABS
500.0000 mg | ORAL_TABLET | Freq: Two times a day (BID) | ORAL | 1 refills | Status: DC
Start: 2020-07-25 — End: 2020-10-31

## 2020-07-25 MED ORDER — LISINOPRIL 20 MG PO TABS
20.0000 mg | ORAL_TABLET | Freq: Every day | ORAL | 1 refills | Status: DC
Start: 2020-07-25 — End: 2020-10-31

## 2020-07-25 MED ORDER — ATORVASTATIN CALCIUM 20 MG PO TABS
20.0000 mg | ORAL_TABLET | Freq: Every day | ORAL | 1 refills | Status: DC
Start: 2020-07-25 — End: 2020-10-31

## 2020-07-25 MED ORDER — ASPIRIN 325 MG PO TABS
325.0000 mg | ORAL_TABLET | Freq: Every day | ORAL | 1 refills | Status: DC
Start: 2020-07-25 — End: 2021-03-11

## 2020-07-25 MED ORDER — METOPROLOL TARTRATE 50 MG PO TABS
50.0000 mg | ORAL_TABLET | Freq: Two times a day (BID) | ORAL | 0 refills | Status: DC
Start: 2020-07-25 — End: 2020-08-16

## 2020-07-25 NOTE — Progress Notes (Signed)
BP 100/72   Pulse 67   Temp 97.9 F (36.6 C)   Ht 5' 10.25" (1.784 m)   SpO2 99%   BMI 50.58 kg/m    Subjective:    [name]: Isaiah Ellis, male    DOB: [birth year], 59 y.o.   MRN: [id number]  HPI: Isaiah Ellis is a 59 y.o. male presenting on 07/25/2020 for No chief complaint on file.   HPI     Pt had a negative covid 19 screening questionnaire.    Pt is 59yoM with appt today to follow up HTN.   His amlodipine was decreased to 5mg  at appointment in July due to low BP.    He did not decrease it and is still taking the 10mg    Pt went to ER for ataxia on 07/10/20; records reviewed.    Pt had already been referred to neurologist for problems with balance and history CVA/TIA and abnormal MRI.  He has appointment with neurologist next week.    He is no longer planning to move to .  He says he is not depressed.  0  He is Still smoking      Relevant past medical, surgical, family and social history reviewed and updated as indicated. Interim medical history since our last visit reviewed. Allergies and medications reviewed and updated.    Current Outpatient Medications:  .  amLODipine (NORVASC) 10 MG tablet, Take 10 mg by mouth daily., Disp: , Rfl:  .  aspirin 325 MG tablet, Take 1 tablet (325 mg total) by mouth daily., Disp: 90 tablet, Rfl: 1 .  atorvastatin (LIPITOR) 20 MG tablet, TAKE 1 Tablet BY MOUTH ONCE EVERY DAY, Disp: 90 tablet, Rfl: 1 .  ibuprofen (ADVIL,MOTRIN) 200 MG tablet, Take 800 mg by mouth every 6 (six) hours as needed. Pain., Disp: , Rfl:  .  lisinopril (ZESTRIL) 20 MG tablet, TAKE 1 Tablet BY MOUTH ONCE EVERY DAY, Disp: 90 tablet, Rfl: 1 .  metFORMIN (GLUCOPHAGE) 500 MG tablet, TAKE 1 Tablet  BY MOUTH TWICE DAILY WITH A MEAL, Disp: 180 tablet, Rfl: 1 .  metoprolol tartrate (LOPRESSOR) 50 MG tablet, TAKE 1 Tablet  BY MOUTH TWICE DAILY, Disp: 180 tablet, Rfl: 0 .  amLODipine (NORVASC) 5 MG tablet, Take 1 tablet (5 mg total) by mouth daily.  (Patient not taking: Reported on 07/25/2020), Disp: 90 tablet, Rfl: 0      Review of Systems  Per HPI unless specifically indicated above     Objective:    BP 100/72   Pulse 67   Temp 97.9 F (36.6 C)   Ht 5' 10.25" (1.784 m)   SpO2 99%   BMI 50.58 kg/m   Wt Readings from Last 3 Encounters:  07/10/20 (!) 355 lb (161 kg)  06/13/20 (!) 342 lb (155.1 kg)  05/01/20 (!) 344 lb (156 kg)    Physical Exam Vitals reviewed.  Constitutional:      General: He is not in acute distress.    Appearance: He is well-developed. He is obese.  HENT:     Head: Normocephalic and atraumatic.  Cardiovascular:     Rate and Rhythm: Normal rate and regular rhythm.  Pulmonary:     Effort: Pulmonary effort is normal.     Breath sounds: Normal breath sounds. No wheezing.  Musculoskeletal:     Cervical back: Neck supple.     Right lower leg: No edema.     Left lower leg: No edema.  Lymphadenopathy:  Cervical: No cervical adenopathy.  Skin:    General: Skin is warm and dry.  Neurological:     Mental Status: He is alert and oriented to person, place, and time.     Motor: No weakness or tremor.     Gait: Gait abnormal (walks with a 4-prong cane).  Psychiatric:        Attention and Perception: Attention normal.        Mood and Affect: Mood normal.        Speech: Speech normal.        Behavior: Behavior normal.     Comments: Pt is his usual chatty self.             Assessment & Plan:   Encounter Diagnoses  Name Primary?  . Essential hypertension Yes  . Need for Tdap vaccination   . Diabetes mellitus without complication (HCC)   . Hyperlipidemia, unspecified hyperlipidemia type   . Morbid obesity (HCC)   . Tobacco use disorder      -pt to see neurologist next week as scheduled.  -cut back the amlodipine to 5mg  -discussed with pt that he should avoid etoh and marijuana so as to not worsen his ataxia or balance problems or increase risk of fall -pt Tdap updated today -Pt to  follow up end of October with fasting labs before appointment.   He is to contact office sooner prn

## 2020-07-31 ENCOUNTER — Ambulatory Visit: Payer: Self-pay | Admitting: Diagnostic Neuroimaging

## 2020-07-31 ENCOUNTER — Telehealth: Payer: Self-pay | Admitting: *Deleted

## 2020-07-31 ENCOUNTER — Encounter: Payer: Self-pay | Admitting: Diagnostic Neuroimaging

## 2020-07-31 NOTE — Telephone Encounter (Signed)
Patient was no show for new patient appointment today. 

## 2020-08-02 ENCOUNTER — Telehealth: Payer: Self-pay

## 2020-08-02 NOTE — Congregational Nurse Program (Signed)
Patient was met during a home visit presented on today to initiate enrollment in the Remote Monitoring in the [facility] Program and states readiness to participate and be committed    Pt successfully onboarded with initial BP and scale test readings that connected  to the [facility] and provider dashboard successfully.   Initial recorded [place] BP Test reading  was 143/104  (with shoes (on  [employer] Scale) observed.    Pt was provided with Remote Monitoriing in the [facility] patient facing booklet that reviewed all program requirements, guidelines, dash diet brochure/guidelines, proper techinque for monitoring and troubleshooting equipment.    -Reviewed and verified patient's BP Medication and consistency of use with patient.   - Pt understood guidelines and expectations of program,  -Pt understood and demonstrated proper use of BP and Scale equipment by way of teachbacks after it was demonstrated to pt - Pt was provided BP Basics Manual with a coordinating BP-Personal Action Plan that was completed on site and placed in chart --Pt identified and created personal action plan goals successully          Personal Action Plan:        -Pt will check BP 1 time day for  6 days a week and obtained set of paired readings, alternating on days to obtain a.m. and p.m readings  (Will check prior to morning or evening meds) -Pt will monitor weight once (1) daily without clothing  -Pt will increase activity level by challenging self to walk to mailbox daily and performing lighthousekeeping work around the house (sweeping, cleaning stovetop, and overall tidying up home) -Pt will attempt to reduce smoking to 1 pk of cigarettes week and to attempting to smoke only 1/2 of a cigarette whenever feel the urge to smoke versus 1 whole cigarette -Pt is wanting to continue to improve his dietary eating habits as he currently been doing (frozen vegetables (mostly beans of different types, bake, broil,  boil, fish, chicken, [place], cook olive oil, limit portion sizes on plate (small vs large), continue limiting salt if not deciding to use any. -Pt will attempt to cut back on eating sweets and sugary based foods -Pt will continue to f/u with provider as recommended if BP is abormal  _Pt wants to achieve wt loss goal of 17lbs or to get down to 324 lbs (5% of current weight of 341 as last documented at medical providers office -Pt will attempt to manage stress through committing to practicing gratitude and joy -Pt will  continue medications as prescribed And commit to taking meds between the hours of 7am-8a, for morning meds and between 5p-6pm for night meds  -Next Day/Check in F/U will be conducted on 08/02/28/21   -to check in 1st Weekly F/U update will be made on 08/06/20 regarding 1st day use of the equipment and to address any questions   -RN Case Manager will review   BP readings daily and provide 7 day average and trends weekly and communicate

## 2020-08-02 NOTE — Telephone Encounter (Addendum)
Pt was f/u by phone on today to address any questions or concerns he may have had on first day of using the equipment and collecting BP and Scale measurements.   Pt stated he felt good about the use of the equipment, but was unclear on reason why his BP and wt readings did not successfully transmit the data to the [facility] Clinical Dashboard.  He stated he did enter all readings manually as taught on the day of  Enrollment  until his wi-fi and internet service is connectedon 08/03/20, which will then allow his readings to transmit automatically  Plan Attend patient home to assess equipment and provide technical assistance to resolve the issue of tranmission of the data.

## 2020-08-07 ENCOUNTER — Telehealth: Payer: Self-pay

## 2020-08-07 NOTE — Telephone Encounter (Addendum)
F/U on weekly update r/t remote monitoring hypertension program was completed by phone on today with patient.  Pt states he  feels confident about the use of th BP and scale equipment, however he was feeling a  little nervous regarding the success of the BP and wt readings being transmitted properly since he received his new internet connection.    Successes for the week  -Checked  BP 1 time day for 6 days to get minimum of 12 readings and obtained set of paired readings, alternating on days to obtain a.m. and p.m readings  (Will check prior to morning or evening meds) (ON TARGET)  -Took meds daily,  time per action plan (ON TARGET)   (Take meds between the hours of 7am-8a, for morning meds and between 5p-6pm for night meds)  -Monitored weight once (1) daily without clothing per protocol on action plan  (ON TARGET)  -Increased activity level by challenging self to walk to mailbox (56mns +10 mins from= 268ms for at least 3-4 times last week ) with mins totaling 80 mins of movement; performed lighthousekeeping work around the house (sweeping, cleaning stovetop, and overall tidying up home) 3-4 times last week for about 20 mins each time (totaling 8074m) in addition to challenging self to [place] cart instead of riding the [employer]-4 times last week = 103m49m(totaling 80mi68m Activity level totaled 240 mins which met the action plan goal of 150 mins (2hrs and 30min36mor the week  (ON TARGET)    -Improved on reducing smoking to 1 pk of cigarettes and attempted to keep with commitment of only smoking  a 1/2 of a cigarette whenever feeling the urge to smoke a 1 whole cigarette  (ON TARGET).   (Just finished up 1pk 2 days ago that lasted almost 1.5wk before having to open of new pack   -Maintained improving healthy food plan by including frozen vegetables (mostly beans of different types, bake, broil, boil, fish, chicken, [place] olive oil, limit portion sizes on plate (small vs  large), continue limiting salt if not deciding to use any (ON TARGET)  Added more toss salads along with maintaining above eating habits   -Improved  ON LOSING WEIGHT-WENT DOWN BY  10 LBS SINCE LAST PRIMARY CARE VISIT 9/8  (On TARGET) Pt wants to achieve wt loss goal of 17lbs or to get down to 324 lbs (5% of current weight of 341 as last documented at medical providers office   BARRIERS - Reducing eating sweets and sugary based foods,  (Although improved but still somewhat struggles to avoid completely.   Ate some chocolate almonds vs eating both Reeses cup and chocolate almonds on the same day  -Attempting to manage stress through committing to practicing- MORE gratitude and joy. Does not feel as though he still practicing as he should   ACTIONStarruccaoals stated above   -Continue to work on expressing MORE GCeiba all of the good things that have occurred in his life recently  -Pt will continue to f/u with provider as recommended if BP is abormal

## 2020-08-08 ENCOUNTER — Encounter: Payer: Self-pay | Admitting: Diagnostic Neuroimaging

## 2020-08-08 ENCOUNTER — Ambulatory Visit (INDEPENDENT_AMBULATORY_CARE_PROVIDER_SITE_OTHER): Payer: Self-pay | Admitting: Diagnostic Neuroimaging

## 2020-08-08 ENCOUNTER — Other Ambulatory Visit: Payer: Self-pay

## 2020-08-08 VITALS — BP 126/85 | HR 58 | Ht 71.0 in | Wt 339.6 lb

## 2020-08-08 DIAGNOSIS — M4807 Spinal stenosis, lumbosacral region: Secondary | ICD-10-CM

## 2020-08-08 DIAGNOSIS — R4781 Slurred speech: Secondary | ICD-10-CM

## 2020-08-08 DIAGNOSIS — R9389 Abnormal findings on diagnostic imaging of other specified body structures: Secondary | ICD-10-CM

## 2020-08-08 DIAGNOSIS — R269 Unspecified abnormalities of gait and mobility: Secondary | ICD-10-CM

## 2020-08-08 NOTE — Patient Instructions (Signed)
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

## 2020-08-08 NOTE — Progress Notes (Signed)
[facility]  PATIENT: Isaiah Ellis DOB: 07-Jul-1961  REFERRING CLINICIAN: Jacquelin Hawking, PA-C HISTORY FROM: patient  REASON FOR VISIT: new consult    HISTORICAL  CHIEF COMPLAINT:  Chief Complaint  Patient presents with  . Balance, falling, hx stroke    rm 7 New Pt "been in rehab but having issues getting there lately, was homeless for a while, now have apt, no recent falls, uses 4 prong cane"    HISTORY OF PRESENT ILLNESS:   59 year old male with hypertension, hyperlipidemia, diabetes, obesity, here for evaluation of memory difficulty, gait difficulty and abnormal MRI.  Patient reports history of stroke in 2005 with left-sided weakness.  Patient was living in [place] at that time.  Symptoms improved over time.  In March 2020 patient had onset of right-sided weakness, affecting right arm and right leg.  He had some slurred speech and word finding difficulties.  Symptoms have been fluctuating over time.  He had MRI of the brain in July 2021 which showed some chronic changes.  He went to the emergency room in August 2021 for recurrent symptoms and had a repeat MRI which was stable.  A left medullary lesion was noted, possibly chronic ischemic infarct, but other etiologies were raised as possible and therefore postcontrast imaging was recommended.  Patient has had some significant stress factors over the past year.  He was homeless for about a month and then now living in an apartment.    Patient has some knee and hip and low back pain issues.  Has some gait and balance difficulty.  He uses a cane.  Symptoms have been going on for several months and getting worse over time.   REVIEW OF SYSTEMS: Full 14 system review of systems performed and negative with exception of: as per HPI.  ALLERGIES: Allergies  Allergen Reactions  . Bee Venom Swelling    Localized     HOME MEDICATIONS: Outpatient Medications Prior to Visit  Medication Sig Dispense Refill  .  amLODipine (NORVASC) 5 MG tablet Take 1 tablet (5 mg total) by mouth daily. 90 tablet 0  . aspirin 325 MG tablet Take 1 tablet (325 mg total) by mouth daily. 90 tablet 1  . atorvastatin (LIPITOR) 20 MG tablet Take 1 tablet (20 mg total) by mouth daily. 90 tablet 1  . ibuprofen (ADVIL,MOTRIN) 200 MG tablet Take 800 mg by mouth every 6 (six) hours as needed. Pain.    Marland Kitchen lisinopril (ZESTRIL) 20 MG tablet Take 1 tablet (20 mg total) by mouth daily. 90 tablet 1  . metFORMIN (GLUCOPHAGE) 500 MG tablet Take 1 tablet (500 mg total) by mouth 2 (two) times daily with a meal. 180 tablet 1  . metoprolol tartrate (LOPRESSOR) 50 MG tablet Take 1 tablet (50 mg total) by mouth 2 (two) times daily. 180 tablet 0   No facility-administered medications prior to visit.    PAST MEDICAL HISTORY: Past Medical History:  Diagnosis Date  . Balance problems   . Depression   . Diabetes mellitus without complication (HCC)   . Falling   . Gout   . Hyperlipidemia   . Hypertension   . Stroke [facility]) 2005 and 02/2019    PAST SURGICAL HISTORY: No past surgical history on file.  FAMILY HISTORY: Family History  Problem Relation Age of Onset  . Hypertension Mother   . Stroke Mother   . Heart disease Father   . Stroke Father   . Hypertension Father     SOCIAL HISTORY: Social  History   Socioeconomic History  . Marital status: Single    Spouse name: Not on file  . Number of children: 0  . Years of education: Not on file  . Highest education level: Associate degree: academic program  Occupational History  . Not on file  Tobacco Use  . Smoking status: Current Every Day Smoker    Packs/day: 0.25    Years: 40.00    Pack years: 10.00    Types: Cigarettes  . Smokeless tobacco: Never Used  . Tobacco comment: 08/08/20 trying to quit  Vaping Use  . Vaping Use: Never used  Substance and Sexual Activity  . Alcohol use: Not Currently    Comment: occasional  . Drug use: Not Currently    Types: Marijuana, Cocaine     Comment: couple of months, no cocaine, 08/08/20 marijuana every now and then  . Sexual activity: Not on file  Other Topics Concern  . Not on file  Social History Narrative   Lives alone   No caffeine   Social Determinants of Health   Financial Resource Strain:   . Difficulty of Paying Living Expenses: Not on file  Food Insecurity:   . Worried About Programme researcher, broadcasting/film/videounning Out of Food in the Last Year: Not on file  . Ran Out of Food in the Last Year: Not on file  Transportation Needs:   . Lack of Transportation (Medical): Not on file  . Lack of Transportation (Non-Medical): Not on file  Physical Activity:   . Days of Exercise per Week: Not on file  . Minutes of Exercise per Session: Not on file  Stress:   . Feeling of Stress : Not on file  Social Connections:   . Frequency of Communication with Friends and Family: Not on file  . Frequency of Social Gatherings with Friends and Family: Not on file  . Attends Religious Services: Not on file  . Active Member of Clubs or Organizations: Not on file  . Attends BankerClub or Organization Meetings: Not on file  . Marital Status: Not on file  Intimate Partner Violence:   . Fear of Current or Ex-Partner: Not on file  . Emotionally Abused: Not on file  . Physically Abused: Not on file  . Sexually Abused: Not on file     PHYSICAL EXAM  GENERAL EXAM/CONSTITUTIONAL: Vitals:  Vitals:   08/08/20 0812  BP: 126/85  Pulse: (!) 58  Weight: (!) 339 lb 9.6 oz (154 kg)  Height: 5\' 11"  (1.803 m)     Body mass index is 47.36 kg/m. Wt Readings from Last 3 Encounters:  08/08/20 (!) 339 lb 9.6 oz (154 kg)  08/01/20 (!) 350 lb (158.8 kg)  07/25/20 (!) 341 lb 8 oz (154.9 kg)     Patient is in no distress; well developed, nourished and groomed; neck is supple  CARDIOVASCULAR:  Examination of carotid arteries is normal; no carotid bruits  Regular rate and rhythm, no murmurs  Examination of peripheral vascular system by observation and palpation is  normal  EYES:  Ophthalmoscopic exam of optic discs and posterior segments is normal; no papilledema or hemorrhages  No exam data present  MUSCULOSKELETAL:  Gait, strength, tone, movements noted in Neurologic exam below  NEUROLOGIC: MENTAL STATUS:  No flowsheet data found.  awake, alert, oriented to person, place and time  recent and remote memory intact  normal attention and concentration  language fluent, comprehension intact, naming intact  fund of knowledge appropriate  CRANIAL NERVE:   2nd - no  papilledema on fundoscopic exam  2nd, 3rd, 4th, 6th - pupils equal and reactive to light, visual fields full to confrontation, extraocular muscles intact, no nystagmus  5th - facial sensation symmetric  7th - facial strength symmetric  8th - hearing intact  9th - palate elevates symmetrically, uvula midline  11th - shoulder shrug symmetric  12th - tongue protrusion midline  MOTOR:   normal bulk and tone, full strength in the BUE  BLE 3-4 DIFFUSE; BILATERAL KNEE AND HIP PAIN  SENSORY:   normal and symmetric to light touch, temperature, vibration  COORDINATION:   finger-nose-finger, fine finger movements SLOW BILATERALLY  REFLEXES:   deep tendon reflexes TRACE and symmetric  GAIT/STATION:   narrow based gait; ANTALGIC; USES CANE     DIAGNOSTIC DATA (LABS, IMAGING, TESTING) - I reviewed patient records, labs, notes, testing and imaging myself where available.  Lab Results  Component Value Date   WBC 6.7 07/10/2020   HGB 13.9 07/10/2020   HCT 41.0 07/10/2020   MCV 78.8 (L) 07/10/2020   PLT 297 07/10/2020      Component Value Date/Time   NA 143 07/10/2020 1021   K 3.9 07/10/2020 1021   CL 106 07/10/2020 1021   CO2 23 07/10/2020 1006   GLUCOSE 92 07/10/2020 1021   BUN 9 07/10/2020 1021   CREATININE 1.00 07/10/2020 1021   CALCIUM 9.1 07/10/2020 1006   PROT 7.2 07/10/2020 1006   ALBUMIN 4.0 07/10/2020 1006   AST 16 07/10/2020 1006   ALT  14 07/10/2020 1006   ALKPHOS 74 07/10/2020 1006   BILITOT 0.4 07/10/2020 1006   GFRNONAA >60 07/10/2020 1006   GFRAA >60 07/10/2020 1006   Lab Results  Component Value Date   CHOL 165 06/05/2020   HDL 54 06/05/2020   LDLCALC 86 06/05/2020   TRIG 125 06/05/2020   CHOLHDL 3.1 06/05/2020   Lab Results  Component Value Date   HGBA1C 6.2 (H) 06/05/2020   No results found for: VITAMINB12 No results found for: TSH   05/06/19 carotid u/s Color duplex indicates minimal heterogeneous and calcified plaque, with no hemodynamically significant stenosis by duplex criteria in the extracranial cerebrovascular circulation.   05/03/19 TTE 1. The left ventricle has normal systolic function with an ejection  fraction of 60-65%. The cavity size was normal. There is moderately  increased left ventricular wall thickness. Left ventricular diastolic  Doppler parameters are consistent with impaired  relaxation.  2. The right ventricle has normal systolic function. The cavity was  normal. There is no increase in right ventricular wall thickness.  3. Left atrial size was mildly dilated.  4. No evidence of mitral valve stenosis.  5. The aortic valve has an indeterminate number of cusps. No stenosis of  the aortic valve.  6. The aortic root is normal in size and structure.  7. Pulmonary hypertension is indeterminate, inadequate TR jet.   06/05/20 MRI brain [I reviewed images myself and agree with interpretation. May represent chronic infarct in the left medulla. -VRP]  No acute intracranial process. Multifocal cerebral and cerebellar microhemorrhages predominantly involving the basal ganglia are suspicious for chronic hypertensive encephalopathy.  Nonexpansile anterior left medulla T2/FLAIR hyperintense signal is nonspecific. Differential includes sequela of prior insult, capillary telangiectasia and demyelination. Consider postcontrast MRI imaging for further evaluation.  Mild cerebral  atrophy. Moderate chronic microvascular ischemic Changes.  07/10/20 MRI brain [I reviewed images myself and agree with interpretation. May represent chronic infarct in the left medulla.  -VRP]  Negative for acute  infarct. Chronic microvascular ischemic changes diffusely. Numerous foci of chronic microhemorrhage throughout the brain bilaterally likely due to chronic hypertensive insult.  7 mm round hyperintensity left pons is unchanged. Possible infarct. Neoplasm also in the differential. Recommend follow-up baseline MRI brain with contrast as well as follow-up scans to assure stability.     ASSESSMENT AND PLAN  59 y.o. year old male here with:  Dx:  1. Abnormal MRI   2. Gait difficulty   3. Slurred speech   4. 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 MR BRAIN W WO CONTRAST  . MR LUMBAR SPINE WO CONTRAST   Return pending test results, for pending if symptoms worsen or fail to improve.    Suanne Marker, MD 08/08/2020, 8:53 AM Certified in Neurology, Neurophysiology and Neuroimaging  [facility] [address] [place], [place] [address] [phone]

## 2020-08-09 ENCOUNTER — Telehealth: Payer: Self-pay | Admitting: Diagnostic Neuroimaging

## 2020-08-09 NOTE — Telephone Encounter (Signed)
[facility] letter expires on 11/13/20.Isaiah Ellis patient is scheduled at [facility] for Tuesday 08/21/20 to arrive at 2:30 pm. I left a voicemail informing this to the patient I also left their number of [phone] incase he needs to r/s.

## 2020-08-14 ENCOUNTER — Other Ambulatory Visit: Payer: Self-pay | Admitting: Physician Assistant

## 2020-08-16 ENCOUNTER — Other Ambulatory Visit: Payer: Self-pay | Admitting: Physician Assistant

## 2020-08-16 MED ORDER — METOPROLOL TARTRATE 50 MG PO TABS
50.0000 mg | ORAL_TABLET | Freq: Two times a day (BID) | ORAL | 0 refills | Status: DC
Start: 2020-08-16 — End: 2020-10-31

## 2020-08-21 ENCOUNTER — Encounter (HOSPITAL_COMMUNITY): Payer: Self-pay | Admitting: Physical Therapy

## 2020-08-21 ENCOUNTER — Ambulatory Visit (HOSPITAL_COMMUNITY)
Admission: RE | Admit: 2020-08-21 | Discharge: 2020-08-21 | Disposition: A | Discharge status: Home / Self Care | Payer: MEDICAID | Source: Ambulatory Visit | Attending: Diagnostic Neuroimaging | Admitting: Diagnostic Neuroimaging

## 2020-08-21 ENCOUNTER — Other Ambulatory Visit: Payer: Self-pay

## 2020-08-21 DIAGNOSIS — M4807 Spinal stenosis, lumbosacral region: Secondary | ICD-10-CM | POA: Insufficient documentation

## 2020-08-21 DIAGNOSIS — R9389 Abnormal findings on diagnostic imaging of other specified body structures: Secondary | ICD-10-CM | POA: Insufficient documentation

## 2020-08-21 DIAGNOSIS — R269 Unspecified abnormalities of gait and mobility: Secondary | ICD-10-CM | POA: Insufficient documentation

## 2020-08-21 MED ORDER — GADOBUTROL 1 MMOL/ML IV SOLN
10.0000 mL | Freq: Once | INTRAVENOUS | Status: AC | PRN
  Administered 2020-08-21: 10 mL via INTRAVENOUS

## 2020-08-21 NOTE — Therapy (Signed)
[place] °[facility] °[address] °[place], [place], [address] °Phone: [phone]   Fax:  [phone] ° °Patient Details  °Name: Isaiah Ellis °MRN: [id number] °Date of Birth: 11/24/1960 °Referring Provider:  No ref. provider found ° °Encounter Date: 08/21/2020  ° °PHYSICAL THERAPY DISCHARGE SUMMARY ° °Visits from Start of Care: 9 ° °Current functional level related to goals / functional outcomes: °Unknown as patient did not return to physical therapy for formal re-assessment °  °Remaining deficits: °Unknown as patient did not return to physical therapy for formal re-assessment °  °Education / Equipment: °HEP °Plan: °                                                   Patient goals were not met. Patient is being discharged due to not returning since the last visit.  ?????  ° ° ° °  PT, DPT °11:01 AM, 08/21/20 °[phone] ° °[place] °[facility] °[address] °, [place], [address] °Phone: [phone]   Fax:  [phone] °

## 2020-08-23 NOTE — Congregational Nurse Program (Signed)
F/U on weekly update r/t remote monitoring hypertension program was completed by home visit on today with patient.  Pt states he  feels confident about the use of the BP and scale equipment.    Successes for the week  -Continues to check BP as agreed with personal action plan -Continues to take BP meds daily per action plan -Monitors weight once (1) daily withoutclothing per protocol on action plan -Continues to maintain activity levelby challenging self to walk to mailbox (79mns +10 mins from= 23ms for at least 3-4 times last week ) with mins totaling 80 mins of movement; performed lighthousekeeping work around the house (sweeping, cleaning stovetop, and overall tidying up home) 3-4 times last week for about 20 mins each time (totaling 801m) in addition to challenging self to [employer] cart instead of riding the [employer] times last week = 1m62m(totaling 60mi80m Activity level totaled 220 mins which met the action plan goal of 150 mins (2hrs and 30min38mor the week  (ON TARGET)    -Improved on reducing smoking to only have some smoking a 1/3  of a pack of  cigarette (which was only about 2-3 whole cigarettes out of a the pack for the week)    -Maintained improving healthy food plan by including frozen vegetables (mostly beans of different types, bake, broil, boil, fish, chicken, [place] olive oil, limit portion sizes on plate (small vs large), continue limit salt   -Improved  More weight loss   BARRIERS -Working progress of continuing  to attempt to manage stress and balance levels -technicality with scale     PATIENTS ACTION PLAN FOR THE WEEK  -Continue to work on expressing MORE GWalsh all of the good things that have occurred in his life recently -No longer need to manual put in BP reading, but do continue to put in Scale Reading manually until batteries can be replaced in SCALE New[place]ogram incentive items to encouraging  water intake and monitoring steps of activity more    Health[facility]  -changed  Batteries in BP monitor and will return with new batteries for the scale   -provided pt with new program incentive items to help with encouragement of monitoring steps more (pedometer) and increasing water intake (water bottle)  - Referred patient to RN Case manager (P. GiElfredia Nevinsssist with resources on Cane Replacement due to "missing cane tips"

## 2020-08-27 NOTE — Congregational Nurse Program (Signed)
Remote patient monitoring program for hypertension in collaboration with [facility]. Client is monitoring and states following medicine regime per [facility] provider.   Last 7 day average blood pressure readings:  Last 7 day average of recorded blood pressure readings: 139/101 Last 7 day Highest reported blood pressure reading: 148/101 Last 7 day lowest reported blood pressure reading: 128/91  Last 30 day averages Last 30 day average of recorded blood pressures: 139/101 Last 30 day highest recorded blood pressure: 154/119 Last 30 day lowest recorded blood pressure: 100/72  Plan: RN will continue to daily monitor blood pressure recordings on [employer] dashboard during normal office hours as per guidelines, assess for escalations and trends that would require intervention or notification to provider.  Maia Plan LPN will continue to offer weekly follow ups, support and health coaching.  RN and Chrisandra Netters LPN will continue to meet weekly to discuss client's progress to meeting goals and identify any potential barriers to meeting goals and develop possible solutions/action plan for identified barriers.   Francee Nodal RN  Clara Gunn/[facility]  Case Manager

## 2020-08-28 ENCOUNTER — Telehealth: Payer: Self-pay | Admitting: *Deleted

## 2020-08-28 DIAGNOSIS — R937 Abnormal findings on diagnostic imaging of other parts of musculoskeletal system: Secondary | ICD-10-CM

## 2020-08-28 DIAGNOSIS — M4807 Spinal stenosis, lumbosacral region: Secondary | ICD-10-CM

## 2020-08-28 NOTE — Addendum Note (Signed)
Addended by: [place] Pink on: 08/28/2020 03:46 PM   Modules accepted: Orders

## 2020-08-28 NOTE — Telephone Encounter (Signed)
LVM requesting call back for MRI results. 

## 2020-08-28 NOTE — Telephone Encounter (Signed)
Spoke with patient and informed his MRI brain is stable MRI; likely old stroke findings, no acute or major findings. His MRI lumbar spine shows spinal stenosis in lower back.He may consider spine surgery consult. Patient stated he would like neurosurgery consult. I advised the order will be placed. Patient verbalized understanding, appreciation.

## 2020-09-05 ENCOUNTER — Other Ambulatory Visit: Payer: Self-pay

## 2020-09-05 ENCOUNTER — Other Ambulatory Visit (HOSPITAL_COMMUNITY)
Admission: RE | Admit: 2020-09-05 | Discharge: 2020-09-05 | Disposition: A | Discharge status: Home / Self Care | Payer: Medicaid Other | Source: Ambulatory Visit | Attending: Physician Assistant | Admitting: Physician Assistant

## 2020-09-05 DIAGNOSIS — I10 Essential (primary) hypertension: Secondary | ICD-10-CM | POA: Diagnosis present

## 2020-09-05 DIAGNOSIS — R69 Illness, unspecified: Secondary | ICD-10-CM | POA: Diagnosis present

## 2020-09-05 DIAGNOSIS — E119 Type 2 diabetes mellitus without complications: Secondary | ICD-10-CM

## 2020-09-05 DIAGNOSIS — E785 Hyperlipidemia, unspecified: Secondary | ICD-10-CM | POA: Diagnosis present

## 2020-09-05 LAB — COMPREHENSIVE METABOLIC PANEL
ALT: 14 U/L (ref 0–44)
AST: 15 U/L (ref 15–41)
Albumin: 4.3 g/dL (ref 3.5–5.0)
Alkaline Phosphatase: 73 U/L (ref 38–126)
Anion gap: 6 (ref 5–15)
BUN: 14 mg/dL (ref 6–20)
CO2: 24 mmol/L (ref 22–32)
Calcium: 9.3 mg/dL (ref 8.9–10.3)
Chloride: 106 mmol/L (ref 98–111)
Creatinine, Ser: 1.19 mg/dL (ref 0.61–1.24)
GFR, Estimated: >60 mL/min (ref >60)
Glucose, Bld: 91 mg/dL (ref 70–99)
Potassium: 4 mmol/L (ref 3.5–5.1)
Sodium: 136 mmol/L (ref 135–145)
Total Bilirubin: 0.8 mg/dL (ref 0.3–1.2)
Total Protein: 7.8 g/dL (ref 6.5–8.1)

## 2020-09-05 LAB — LIPID PANEL
Cholesterol: 143 mg/dL (ref 0–200)
HDL: 50 mg/dL (ref >40)
LDL Cholesterol: 75 mg/dL (ref 0–99)
Total CHOL/HDL Ratio: 2.9 RATIO
Triglycerides: 89 mg/dL (ref <150)
VLDL: 18 mg/dL (ref 0–40)

## 2020-09-06 ENCOUNTER — Telehealth: Payer: Self-pay | Admitting: Diagnostic Neuroimaging

## 2020-09-06 LAB — HEMOGLOBIN A1C
Hgb A1c MFr Bld: 5.9 % — ABNORMAL HIGH (ref 4.8–5.6)
Mean Plasma Glucose: 123 mg/dL

## 2020-09-06 LAB — MICROALBUMIN, URINE: Microalb, Ur: 51.9 ug/mL — ABNORMAL HIGH

## 2020-09-06 NOTE — Telephone Encounter (Signed)
Noted. Referral sent 

## 2020-09-06 NOTE — Telephone Encounter (Signed)
Patient has [facility] financial assistance . [facility] does not accept . [facility] has two doctors that specialize in what he needs.   I have talked to [facility] and they can accept him .   . Dr. Marjory Lies is this ok with you?   Spine re: lumbar spinal stenosis

## 2020-09-06 NOTE — Telephone Encounter (Signed)
Yes; agree. -VRP

## 2020-09-10 ENCOUNTER — Ambulatory Visit: Payer: Self-pay | Admitting: Physician Assistant

## 2020-09-10 ENCOUNTER — Encounter: Payer: Self-pay | Admitting: Physician Assistant

## 2020-09-10 VITALS — BP 102/80 | HR 62 | Temp 97.9°F | Ht 71.0 in | Wt 330.8 lb

## 2020-09-10 DIAGNOSIS — I10 Essential (primary) hypertension: Secondary | ICD-10-CM

## 2020-09-10 DIAGNOSIS — Z1211 Encounter for screening for malignant neoplasm of colon: Secondary | ICD-10-CM

## 2020-09-10 DIAGNOSIS — E785 Hyperlipidemia, unspecified: Secondary | ICD-10-CM

## 2020-09-10 DIAGNOSIS — F17200 Nicotine dependence, unspecified, uncomplicated: Secondary | ICD-10-CM

## 2020-09-10 DIAGNOSIS — E119 Type 2 diabetes mellitus without complications: Secondary | ICD-10-CM

## 2020-09-10 NOTE — Patient Instructions (Signed)
[facility] - [address] [place], [place] [address] [phone] Wed. September 26, 2020 9:30 AM

## 2020-09-10 NOTE — Progress Notes (Signed)
BP 102/80   Pulse 62   Temp 97.9 F (36.6 C)   Ht 5\' 11"  (1.803 m)   Wt (!) 330 lb 12 oz (150 kg)   SpO2 98%   BMI 46.13 kg/m    Subjective:    [name]: , male    DOB: 25-Jan-1961, 59 y.o.   MRN: [id number]  HPI: Isaiah Ellis is a 59 y.o. male presenting on 09/10/2020 for Hypertension, Hyperlipidemia, and Diabetes   HPI    Pt had a negative covid 19 screening questionnaire.    Pt is a 59yoM with DM, htn, dyslipidemia, obesity, mood disorder, gait difficulty, spinal stenosis and history cva.    Pt participates in the HTN monitoring program.  He has some frustrations about it.    Pt says He does what he can now.  He says he contintues with rehab exercises. He says he is doing well in his head and is learning to accept things as they are.  He has no new complaints today.    Relevant past medical, surgical, family and social history reviewed and updated as indicated. Interim medical history since our last visit reviewed. Allergies and medications reviewed and updated.   Current Outpatient Medications:  .  amLODipine (NORVASC) 5 MG tablet, Take 1 tablet (5 mg total) by mouth daily., Disp: 90 tablet, Rfl: 0 .  aspirin 325 MG tablet, Take 1 tablet (325 mg total) by mouth daily., Disp: 90 tablet, Rfl: 1 .  atorvastatin (LIPITOR) 20 MG tablet, Take 1 tablet (20 mg total) by mouth daily., Disp: 90 tablet, Rfl: 1 .  ibuprofen (ADVIL,MOTRIN) 200 MG tablet, Take 800 mg by mouth every 6 (six) hours as needed. Pain., Disp: , Rfl:  .  lisinopril (ZESTRIL) 20 MG tablet, Take 1 tablet (20 mg total) by mouth daily., Disp: 90 tablet, Rfl: 1 .  metFORMIN (GLUCOPHAGE) 500 MG tablet, Take 1 tablet (500 mg total) by mouth 2 (two) times daily with a meal., Disp: 180 tablet, Rfl: 1 .  metoprolol tartrate (LOPRESSOR) 50 MG tablet, Take 1 tablet (50 mg total) by mouth 2 (two) times daily., Disp: 180 tablet, Rfl: 0    Review of Systems  Per HPI unless specifically indicated  above     Objective:    BP 102/80   Pulse 62   Temp 97.9 F (36.6 C)   Ht 5\' 11"  (1.803 m)   Wt (!) 330 lb 12 oz (150 kg)   SpO2 98%   BMI 46.13 kg/m   Wt Readings from Last 3 Encounters:  09/10/20 (!) 330 lb 12 oz (150 kg)  08/08/20 (!) 339 lb 9.6 oz (154 kg)  08/01/20 (!) 350 lb (158.8 kg)    Physical Exam Vitals reviewed.  Constitutional:      General: He is not in acute distress.    Appearance: He is well-developed. He is obese.  HENT:     Head: Normocephalic and atraumatic.  Cardiovascular:     Rate and Rhythm: Normal rate and regular rhythm.  Pulmonary:     Effort: Pulmonary effort is normal.     Breath sounds: Normal breath sounds. No wheezing.  Abdominal:     General: Bowel sounds are normal.     Palpations: Abdomen is soft.     Tenderness: There is no abdominal tenderness.  Musculoskeletal:     Cervical back: Neck supple.     Right lower leg: No edema.     Left lower leg: No edema.  Lymphadenopathy:     Cervical: No cervical adenopathy.  Skin:    General: Skin is warm and dry.  Neurological:     Mental Status: He is alert and oriented to person, place, and time.     Gait: Gait abnormal (walks slowly with a 4-prong cane).  Psychiatric:        Attention and Perception: Attention normal.        Behavior: Behavior normal. Behavior is cooperative.     Results for orders placed or performed during the hospital encounter of 09/05/20  Microalbumin, urine  Result Value Ref Range   Microalb, Ur 51.9 (H) Not Estab. ug/mL  Hemoglobin A1c  Result Value Ref Range   Hgb A1c MFr Bld 5.9 (H) 4.8 - 5.6 %   Mean Plasma Glucose 123 mg/dL  Lipid panel  Result Value Ref Range   Cholesterol 143 0 - 200 mg/dL   Triglycerides 89 <053 mg/dL   HDL 50 >97 mg/dL   Total CHOL/HDL Ratio 2.9 RATIO   VLDL 18 0 - 40 mg/dL   LDL Cholesterol 75 0 - 99 mg/dL  Comprehensive metabolic panel  Result Value Ref Range   Sodium 136 135 - 145 mmol/L   Potassium 4.0 3.5 - 5.1 mmol/L    Chloride 106 98 - 111 mmol/L   CO2 24 22 - 32 mmol/L   Glucose, Bld 91 70 - 99 mg/dL   BUN 14 6 - 20 mg/dL   Creatinine, Ser 6.73 0.61 - 1.24 mg/dL   Calcium 9.3 8.9 - 41.9 mg/dL   Total Protein 7.8 6.5 - 8.1 g/dL   Albumin 4.3 3.5 - 5.0 g/dL   AST 15 15 - 41 U/L   ALT 14 0 - 44 U/L   Alkaline Phosphatase 73 38 - 126 U/L   Total Bilirubin 0.8 0.3 - 1.2 mg/dL   GFR, Estimated >37 >90 mL/min   Anion gap 6 5 - 15      Assessment & Plan:    Encounter Diagnoses  Name Primary?  . Essential hypertension Yes  . Diabetes mellitus without complication (HCC)   . Hyperlipidemia, unspecified hyperlipidemia type   . Morbid obesity (HCC)   . Tobacco use disorder   . Screening for colon cancer      -reviewed labs with pt -pt is given ifobt for colon cancer screening -pt is to continue his current medications -encouraged pt to continue with physical activities as he is able and continue healthy eating -encouraged smoking cessation -pt to follow up 3 months.  He is to contact office sooner prn

## 2020-09-10 NOTE — Telephone Encounter (Signed)
Delsa Sale Dr Ophelia Charter has ok'd to see the patient and I have talked with the patient and scheduled an appointment for 09/18/20 @ 2:15pm.

## 2020-09-11 ENCOUNTER — Other Ambulatory Visit: Payer: Self-pay | Admitting: Physician Assistant

## 2020-09-11 DIAGNOSIS — Z1211 Encounter for screening for malignant neoplasm of colon: Secondary | ICD-10-CM

## 2020-09-18 ENCOUNTER — Other Ambulatory Visit: Payer: Self-pay

## 2020-09-18 ENCOUNTER — Telehealth: Payer: Self-pay

## 2020-09-18 ENCOUNTER — Encounter: Payer: Self-pay | Admitting: Orthopaedic Surgery

## 2020-09-18 ENCOUNTER — Ambulatory Visit (INDEPENDENT_AMBULATORY_CARE_PROVIDER_SITE_OTHER): Payer: Self-pay | Admitting: Orthopaedic Surgery

## 2020-09-18 DIAGNOSIS — M48062 Spinal stenosis, lumbar region with neurogenic claudication: Secondary | ICD-10-CM

## 2020-09-18 DIAGNOSIS — M48061 Spinal stenosis, lumbar region without neurogenic claudication: Secondary | ICD-10-CM | POA: Insufficient documentation

## 2020-09-18 NOTE — Progress Notes (Signed)
Office Visit Note   Patient: Isaiah Ellis           Date of Birth: Mar 19, 1961           MRN: [id number] Visit Date: 09/18/2020              Requested by: Suanne Marker, MD [address] [place],  [place] [address] PCP: Jacquelin Hawking, PA-C   Assessment & Plan: Visit Diagnoses:  1. Spinal stenosis of lumbar region with neurogenic claudication     Plan: Patient needs to work on his blood pressure get this under control work on smoking cessation work on losing some weight.  If his gait improves we reviewed his MRI today and he does have neurogenic claudication and potentially could have increased distance he be able to walk or stand with decompression surgery at 2 levels.  At this point some of his problems is from his brain lesions with gait problem slurred speech chronic medulla stroke.  I plan to check him back again in 4 months.  We will see how he does on weight loss smoking cessation of blood pressure control.  Follow-Up Instructions: Return in about 4 months (around 01/16/2021).   Orders:  No orders of the defined types were placed in this encounter.  No orders of the defined types were placed in this encounter.     Procedures: No procedures performed   Clinical Data: No additional findings.   Subjective: Chief Complaint  Patient presents with  . Lower Back - Pain    HPI 59 year old male returns.  He been followed by neurology for CVA with history of stroke 2005 left-sided weakness.  He is using a 4 pronged cane with a history of weakness.  He was homeless for period of time not had any recent falls but states he cannot stand long and walk far.  He still has some memory difficulty.  Patient had recent MRI of the lumbar spine and is here for review.  Review of Systems past problems with hypertension blood pressure still elevated today.  MRI of the brain showed changes consistent hypertensive microinfarcts.  Plus for hyperlipidemia morbid obesity and  continued tobacco use.   Objective: Vital Signs: BP (!) 165/104   Ht 5\' 11"  (1.803 m)   Wt (!) 330 lb (149.7 kg)   BMI 46.03 kg/m   Physical Exam Constitutional:      Appearance: He is well-developed.  HENT:     Head: Normocephalic and atraumatic.  Eyes:     Pupils: Pupils are equal, round, and reactive to light.  Neck:     Thyroid: No thyromegaly.     Trachea: No tracheal deviation.  Cardiovascular:     Rate and Rhythm: Normal rate.  Pulmonary:     Effort: Pulmonary effort is normal.     Breath sounds: No wheezing.  Abdominal:     General: Bowel sounds are normal.     Palpations: Abdomen is soft.  Skin:    General: Skin is warm and dry.     Capillary Refill: Capillary refill takes less than 2 seconds.  Neurological:     Mental Status: He is alert and oriented to person, place, and time.  Psychiatric:        Mood and Affect: Mood normal.        Behavior: Behavior normal.     Ortho Exam patient slow getting from sitting to standing. He has fair balance amatory with a 4 pronged cane.  Patient  slow with upper extremity range of motion.  He has an antalgic gait. Specialty Comments:  No specialty comments available.  Imaging: No results found.   PMFS History: Patient Active Problem List   Diagnosis Date Noted  . Spinal stenosis of lumbar region 09/18/2020  . Hyperlipidemia 05/18/2019  . Diabetes mellitus without complication (HCC) 05/18/2019  . Tobacco use disorder 05/18/2019  . Essential hypertension 03/28/2019   Past Medical History:  Diagnosis Date  . Balance problems   . Depression   . Diabetes mellitus without complication (HCC)   . Falling   . Gout   . Hyperlipidemia   . Hypertension   . Stroke [facility]) 2005 and 02/2019    Family History  Problem Relation Age of Onset  . Hypertension Mother   . Stroke Mother   . Heart disease Father   . Stroke Father   . Hypertension Father     No past surgical history on file. Social History   Occupational  History  . Not on file  Tobacco Use  . Smoking status: Current Every Day Smoker    Packs/day: 0.25    Years: 40.00    Pack years: 10.00    Types: Cigarettes  . Smokeless tobacco: Never Used  . Tobacco comment: 08/08/20 trying to quit  Vaping Use  . Vaping Use: Never used  Substance and Sexual Activity  . Alcohol use: Not Currently    Comment: occasional  . Drug use: Not Currently    Types: Marijuana, Cocaine    Comment: couple of months, no cocaine, 08/08/20 marijuana every now and then  . Sexual activity: Not on file

## 2020-09-26 LAB — HM DIABETES EYE EXAM

## 2020-10-30 NOTE — Congregational Nurse Program (Signed)
Weekly Update for Remote Moniorin

## 2020-10-31 ENCOUNTER — Telehealth: Payer: Self-pay

## 2020-10-31 ENCOUNTER — Other Ambulatory Visit: Payer: Self-pay | Admitting: Physician Assistant

## 2020-10-31 MED ORDER — AMLODIPINE BESYLATE 5 MG PO TABS
5.0000 mg | ORAL_TABLET | Freq: Every day | ORAL | 0 refills | Status: DC
Start: 2020-10-31 — End: 2020-11-05

## 2020-10-31 MED ORDER — AMLODIPINE BESYLATE 5 MG PO TABS
5.0000 mg | ORAL_TABLET | Freq: Every day | ORAL | 0 refills | Status: DC
Start: 2020-10-31 — End: 2020-10-31

## 2020-10-31 NOTE — Telephone Encounter (Signed)
Contacted client by text message to inform him that transportation has been arranged through [facility] and care connect to assist him to his appointment at [facility] on 11/05/20 at 0930 appointment time. [facility] transportation will contact client to confirm pick up time.  Client acknowledged.

## 2020-10-31 NOTE — Congregational Nurse Program (Signed)
Note intiated 10/29/20 at 1200  Client enrolled in [facility] hypertension monitoring program. Noted that client remains consistently above target range averaging blood pressure 148/110. No symptoms. Client is attempting to practice stress relief techniques and using support people as well as [facility] staff for support, however client has periods of increased stress due to adjusting to new apartment, financial concerns, issues doing laundry, getting daily things that are not available with EBT. Client is monitoring consistently and receiving health coaching from W. Crowder LPN. Client reports following a healthy diet and salt and sugar intake. Client relates his blood pressure issues to "stress and living in survival mode" states he is learning a new way to be.  Plan: Scanned blood pressure logs and sent  to provider using secure email to [facility]. Will await any changes.   1700 update: Provider did contact client and he has an appointment on 11/05/20 at the [facility]. Will arrange transportation.  Francee Nodal RN [employer]

## 2020-10-31 NOTE — Congregational Nurse Program (Signed)
Weekly Remote Monitoring Hypertension Program was completed by way of home visit  Barriers -Not resting complete 5 mins prior to 1st BP  - Continues to be under stress as it relates to Christmas Season and having bad memories regarding past, being financial distress (no money), being inability to be able to wash clothes on own and having to rely on aunt to wash them and not bringing all items back   upon return   -Continue smoking of cigarettes (currently smoke 2-3 cigs/daily)   Successes   -Continues to monitoring BP and Weight daily  -Continue to take BP meds daily -weight loss since start of program -changed eating habits, no fried foods, no low salt/sodium use, fresh or frozen foods,  -continue to maintain drinking at least 64 ozs of water daily -continue to move around and within apartment at least 150 mins a week/2hr and 30 min -Continues to have a support team to"vent" to at times of stress   Action Plan -Allow current resources to help manage/guide him to assist with figuring out solutions for issues regarding home, food, hygiene related to him, when he is unable to figure out on own  -Learn how to turn Negative situations into Positives  -Continue to work on decreasing cigarette use r/t stress and to find substitute activities to keep hands and mouth busy  Pt stated he understood and update ended.

## 2020-11-02 ENCOUNTER — Telehealth: Payer: Self-pay

## 2020-11-02 NOTE — Telephone Encounter (Signed)
Contacted client that has a prescription for his antihypertensive at [facility] that he cannot afford.  Plan to give client a 10$ [employer] Gift Card from [facility] to assist in getting this medication at [facility]. He is to follow up regarding his blood pressure with his primary care provider on 11/05/20 at [facility] and transportation has been arranged. Client reports his Aunt can take him to get his medication and they will stop by [facility] to pick up the gift card.  Will continue to follow. Client is currently enrolled in the [facility] remote monitoring program with [employer] and [facility].

## 2020-11-05 ENCOUNTER — Encounter: Payer: Self-pay | Admitting: Physician Assistant

## 2020-11-05 ENCOUNTER — Ambulatory Visit: Payer: Self-pay | Admitting: Physician Assistant

## 2020-11-05 VITALS — BP 140/96 | HR 59 | Temp 96.7°F | Ht 71.0 in | Wt 322.2 lb

## 2020-11-05 DIAGNOSIS — I10 Essential (primary) hypertension: Secondary | ICD-10-CM

## 2020-11-05 LAB — IFOBT (OCCULT BLOOD): IFOBT: NEGATIVE

## 2020-11-05 MED ORDER — AMLODIPINE BESYLATE 10 MG PO TABS
10.0000 mg | ORAL_TABLET | Freq: Every day | ORAL | 1 refills | Status: DC
Start: 2020-11-05 — End: 2020-12-12

## 2020-11-05 MED ORDER — AMLODIPINE BESYLATE 10 MG PO TABS: 10.0000 mg | ORAL_TABLET | Freq: Every day | ORAL | 1 refills | Status: DC

## 2020-11-05 NOTE — Patient Instructions (Signed)
covid test  [phone].

## 2020-11-05 NOTE — Progress Notes (Signed)
BP (!) 140/96    Pulse (!) 59    Temp (!) 96.7 F (35.9 C)    Ht 5\' 11"  (1.803 m)    Wt (!) 322 lb 4 oz (146.2 kg)    SpO2 98%    BMI 44.94 kg/m    Subjective:    [name]: , male    DOB: [birth year], 59 y.o.   MRN: [id number]  HPI: Isaiah Ellis is a 59 y.o. male presenting on 11/05/2020 for Hypertension   HPI    Pt had a negative covid 19 screening questionnaire.    Pt is 59yoM with HTN who has appointment today because BP has been running high.  He participates in the home blood pressure monitoring program through [facility].  He is Feeling okay this morning,  He says he was achey over the weekend due to the rain.  He says he is not currently doing Physical Therapy due to family issues.    He is still doing MH counseling.  He does it every Thursday.   Blood Pressures: avg 147/110 High 163/124 Low 129/92   He is having no dizziness.      Relevant past medical, surgical, family and social history reviewed and updated as indicated. Interim medical history since our last visit reviewed. Allergies and medications reviewed and updated.   Current Outpatient Medications:    amLODipine (NORVASC) 5 MG tablet, Take 1 tablet (5 mg total) by mouth daily., Disp: 30 tablet, Rfl: 0   aspirin 325 MG tablet, Take 1 tablet (325 mg total) by mouth daily., Disp: 90 tablet, Rfl: 1   atorvastatin (LIPITOR) 20 MG tablet, TAKE 1 Tablet BY MOUTH ONCE EVERY DAY, Disp: 90 tablet, Rfl: 1   ibuprofen (ADVIL,MOTRIN) 200 MG tablet, Take 800 mg by mouth every 6 (six) hours as needed. Pain., Disp: , Rfl:    lisinopril (ZESTRIL) 20 MG tablet, TAKE 1 Tablet BY MOUTH ONCE EVERY DAY, Disp: 90 tablet, Rfl: 1   metFORMIN (GLUCOPHAGE) 500 MG tablet, TAKE 1 Tablet  BY MOUTH TWICE DAILY WITH A MEAL, Disp: 180 tablet, Rfl: 1   metoprolol tartrate (LOPRESSOR) 50 MG tablet, TAKE 1 Tablet  BY MOUTH TWICE DAILY, Disp: 180 tablet, Rfl: 1     Review of Systems  Per HPI unless  specifically indicated above     Objective:    BP (!) 140/96    Pulse (!) 59    Temp (!) 96.7 F (35.9 C)    Ht 5\' 11"  (1.803 m)    Wt (!) 322 lb 4 oz (146.2 kg)    SpO2 98%    BMI 44.94 kg/m   Wt Readings from Last 3 Encounters:  11/05/20 (!) 322 lb 4 oz (146.2 kg)  09/18/20 (!) 330 lb (149.7 kg)  09/10/20 (!) 330 lb 12 oz (150 kg)      PE:  A&O.  NAD Neck supple Lungs: CTA bilaterally without rales or wheezes Cor:  RRR LE without edema Ambulating with 4prong cane   Discussed with pt that iFOBT was normal     Assessment & Plan:     Encounter Diagnosis  Name Primary?   Essential hypertension Yes       Increase amlodipine.  He has been on 10mg  in the past but it was cut back due to low bp.  He is to continue to monitor his bp.  He is to Continue his other meds  He has appointment end of January  Pt  needs negative covid test so the city will install a washer & dryer for him.  He is given the number to schedule through [facility] as he is not sure what date he needs to have the test done.

## 2020-11-06 ENCOUNTER — Telehealth: Payer: Self-pay

## 2020-11-06 NOTE — Telephone Encounter (Signed)
Called client to follow up and confirm that he was able to pick up his medication Amlodipine 10 mg at [facility] as provider sent new prescription 11/05/20.  Client was able to get medication yesterday. He has not had a blood pressure reading for Derenda Mis today so asked client to take readings. Discussed that hopefully we will begin to see a trend downwards within the next week. Client will continue to monitor per set schedule that he and Maia Plan LPN had determined.([facility] remote monitoring program)  Today's readings after one dose of increased amlodipine. 2:28PM 147/105 pulse 54 2:29PM 142/100 pulse 56  Will continue to follow.  Francee Nodal RN [employer]

## 2020-11-06 NOTE — Congregational Nurse Program (Signed)
Client with primary care follow up today for hypertension. Client remains active with Derenda Mis program for remote monitoring. Provider increased Amlodipine to 10mg  from 5 mg and prescription is at [facility]. Client does not have means financially to afford the 9$ [facility] program assisted client with a one time gift card to [employer] to assist with medication.  Instructed client to allow his body time to adjust when transitioning from lying to sitting and sitting to standing. Client expresses understanding.  Will continue to monitor blood pressures and averages and relate issues to client's primary care provider as an advocate.   [facility] RN [facility] [employer] Nodal [facility]

## 2020-11-23 ENCOUNTER — Other Ambulatory Visit: Payer: Self-pay

## 2020-11-23 ENCOUNTER — Telehealth: Payer: Self-pay

## 2020-11-23 DIAGNOSIS — Z20822 Contact with and (suspected) exposure to covid-19: Secondary | ICD-10-CM

## 2020-11-23 NOTE — Telephone Encounter (Signed)
Pt was contacted to obtain weekly update related to the Remote Monitoring Hypertension Program   Pt states he has been doing ok but has been going through some depression since the holidays has come and gone, but continues to strive as far as improving his weight and attempting to take his BP daily.  States no problems with use of equipment, but states the difficulty of pushing self to get in some p.,m. readings so that they can be compared to the daytime readings.    Pt reports Successes of  -Continues to monitoring BP and Weight daily per program requirements -Continue to take BP meds daily and notices weight loss since start of program although some fluctuations do occur  -Continues to focus on diet with eating fresh/frozen foods (fruits and vegetables) and low to no salt.   -Continue to maintain drinking at least 64oz of water daily -Continue to move around and within apartment at least 150 mins a week/2hr and 30 min   Barriers Reported -Intermittently do not relax as long as should before taking BP  -Forgets to take some evening BP readings to have available to compare to a.m. readings  -financial stress (of not having money)  -cigarette smoking (1 pk week)  Action Plan -Continue to practice gratitude and learn how to turn Negative situations into positives.    -Attempt not to overwhelm self with multiple thoughts of what's not going good and focus on what is going GOOD.    -Continue to work on decreasing use of cigarettes use r/t stress and to find substitute activities to keep hands and mouth busy  -Continue to talk out situations that are stressful to a person he trusts to listen.    Pt stated he understood and update ended.

## 2020-11-27 ENCOUNTER — Telehealth: Payer: Self-pay

## 2020-11-27 LAB — NOVEL CORONAVIRUS, NAA: SARS-CoV-2, NAA: NOT DETECTED

## 2020-11-27 NOTE — Telephone Encounter (Signed)
Returned client's call. Client needs help with CM to call [employer] to advise them that he had his covid test 11/23/20 and the results are negative. Client had been informed that in order for maintenance to help install Washer/Dryer connection he would need a negative covid test.  RN notified [employer] and left message and also informed head of maintenance regarding results with client's verbal consent to do so.  Will follow as needed.  Francee Nodal RN [employer]/ [facility]  Tennis Ship

## 2020-12-05 ENCOUNTER — Other Ambulatory Visit: Payer: Self-pay | Admitting: Physician Assistant

## 2020-12-05 DIAGNOSIS — E785 Hyperlipidemia, unspecified: Secondary | ICD-10-CM

## 2020-12-05 DIAGNOSIS — I10 Essential (primary) hypertension: Secondary | ICD-10-CM

## 2020-12-05 DIAGNOSIS — E119 Type 2 diabetes mellitus without complications: Secondary | ICD-10-CM

## 2020-12-11 ENCOUNTER — Telehealth: Payer: Self-pay

## 2020-12-12 ENCOUNTER — Other Ambulatory Visit: Payer: Self-pay | Admitting: Physician Assistant

## 2020-12-12 MED ORDER — METFORMIN HCL 500 MG PO TABS: ORAL_TABLET | ORAL | 1 refills | Status: DC

## 2020-12-12 MED ORDER — METOPROLOL TARTRATE 50 MG PO TABS
50.0000 mg | ORAL_TABLET | Freq: Two times a day (BID) | ORAL | 1 refills | Status: DC
Start: 2020-12-12 — End: 2021-06-17

## 2020-12-12 MED ORDER — LISINOPRIL 20 MG PO TABS
ORAL_TABLET | ORAL | 1 refills | Status: DC
Start: 2020-12-12 — End: 2021-06-17

## 2020-12-12 MED ORDER — AMLODIPINE BESYLATE 10 MG PO TABS
10.0000 mg | ORAL_TABLET | Freq: Every day | ORAL | 1 refills | Status: DC
Start: 2020-12-12 — End: 2021-06-17

## 2020-12-12 MED ORDER — ATORVASTATIN CALCIUM 20 MG PO TABS
ORAL_TABLET | ORAL | 1 refills | Status: DC
Start: 2020-12-12 — End: 2021-06-17

## 2020-12-14 ENCOUNTER — Other Ambulatory Visit (HOSPITAL_COMMUNITY)
Admission: RE | Admit: 2020-12-14 | Discharge: 2020-12-14 | Disposition: A | Discharge status: Home / Self Care | Payer: Medicaid Other | Source: Ambulatory Visit | Attending: Physician Assistant | Admitting: Physician Assistant

## 2020-12-14 ENCOUNTER — Other Ambulatory Visit: Payer: Self-pay

## 2020-12-14 DIAGNOSIS — I10 Essential (primary) hypertension: Secondary | ICD-10-CM

## 2020-12-14 DIAGNOSIS — E119 Type 2 diabetes mellitus without complications: Secondary | ICD-10-CM | POA: Insufficient documentation

## 2020-12-14 DIAGNOSIS — E785 Hyperlipidemia, unspecified: Secondary | ICD-10-CM | POA: Diagnosis present

## 2020-12-14 DIAGNOSIS — R69 Illness, unspecified: Secondary | ICD-10-CM | POA: Diagnosis present

## 2020-12-14 LAB — LIPID PANEL
Cholesterol: 140 mg/dL (ref 0–200)
HDL: 62 mg/dL (ref >40)
LDL Cholesterol: 64 mg/dL (ref 0–99)
Total CHOL/HDL Ratio: 2.3 RATIO
Triglycerides: 68 mg/dL (ref <150)
VLDL: 14 mg/dL (ref 0–40)

## 2020-12-14 LAB — COMPREHENSIVE METABOLIC PANEL
ALT: 21 U/L (ref 0–44)
AST: 24 U/L (ref 15–41)
Albumin: 3.9 g/dL (ref 3.5–5.0)
Alkaline Phosphatase: 71 U/L (ref 38–126)
Anion gap: 6 (ref 5–15)
BUN: 34 mg/dL — ABNORMAL HIGH (ref 6–20)
CO2: 27 mmol/L (ref 22–32)
Calcium: 9.2 mg/dL (ref 8.9–10.3)
Chloride: 112 mmol/L — ABNORMAL HIGH (ref 98–111)
Creatinine, Ser: 1.54 mg/dL — ABNORMAL HIGH (ref 0.61–1.24)
GFR, Estimated: 51 mL/min — ABNORMAL LOW (ref >60)
Glucose, Bld: 122 mg/dL — ABNORMAL HIGH (ref 70–99)
Potassium: 4.3 mmol/L (ref 3.5–5.1)
Sodium: 145 mmol/L (ref 135–145)
Total Bilirubin: 0.2 mg/dL — ABNORMAL LOW (ref 0.3–1.2)
Total Protein: 7.1 g/dL (ref 6.5–8.1)

## 2020-12-14 LAB — HEMOGLOBIN A1C
Hgb A1c MFr Bld: 6.3 % — ABNORMAL HIGH (ref 4.8–5.6)
Mean Plasma Glucose: 134.11 mg/dL

## 2020-12-17 ENCOUNTER — Encounter: Payer: Self-pay | Admitting: Physician Assistant

## 2020-12-17 ENCOUNTER — Ambulatory Visit: Payer: Self-pay | Admitting: Physician Assistant

## 2020-12-17 VITALS — BP 119/80 | HR 67

## 2020-12-17 DIAGNOSIS — E119 Type 2 diabetes mellitus without complications: Secondary | ICD-10-CM

## 2020-12-17 DIAGNOSIS — E669 Obesity, unspecified: Secondary | ICD-10-CM

## 2020-12-17 DIAGNOSIS — F17200 Nicotine dependence, unspecified, uncomplicated: Secondary | ICD-10-CM

## 2020-12-17 DIAGNOSIS — I10 Essential (primary) hypertension: Secondary | ICD-10-CM

## 2020-12-17 DIAGNOSIS — E785 Hyperlipidemia, unspecified: Secondary | ICD-10-CM

## 2020-12-17 NOTE — Progress Notes (Signed)
BP 119/80   Pulse 67    Subjective:    [name]: Isaiah Ellis, male    DOB: 06/12/1961, 60 y.o.   MRN: [id number]  HPI: Isaiah Ellis is a 60 y.o. male presenting on 12/17/2020 for No chief complaint on file.   HPI   This is a telemedicine appointment through [employer] due to coronavirus pandemic.  I connected with  Isaiah Ellis on 12/17/20 by a video enabled telemedicine application and verified that I am speaking with the correct person using two identifiers.   I discussed the limitations of evaluation and management by telemedicine. The patient expressed understanding and agreed to proceed.  Pt is at home.  Provider is at office.      Pt is a 59yoM with DM, htn, dyslipidemia, obesity, mood disorder, gait difficulty, spinal stenosis and history cva.     He has medicaid  Now.      He says he is doing pretty good.  He has no new complaints.    He is still smoking.    He is continuing to monitor his bp at home ans says it is doing good.       Relevant past medical, surgical, family and social history reviewed and updated as indicated. Interim medical history since our last visit reviewed. Allergies and medications reviewed and updated.   Current Outpatient Medications:  .  amLODipine (NORVASC) 10 MG tablet, Take 1 tablet (10 mg total) by mouth daily., Disp: 30 tablet, Rfl: 1 .  aspirin 325 MG tablet, Take 1 tablet (325 mg total) by mouth daily., Disp: 90 tablet, Rfl: 1 .  atorvastatin (LIPITOR) 20 MG tablet, TAKE 1 Tablet BY MOUTH ONCE EVERY DAY, Disp: 30 tablet, Rfl: 1 .  ibuprofen (ADVIL,MOTRIN) 200 MG tablet, Take 800 mg by mouth every 6 (six) hours as needed. Pain., Disp: , Rfl:  .  lisinopril (ZESTRIL) 20 MG tablet, TAKE 1 Tablet BY MOUTH ONCE EVERY DAY, Disp: 30 tablet, Rfl: 1 .  metFORMIN (GLUCOPHAGE) 500 MG tablet, TAKE 1 Tablet  BY MOUTH TWICE DAILY WITH A MEAL, Disp: 60 tablet, Rfl: 1 .  metoprolol tartrate (LOPRESSOR) 50 MG tablet, Take 1 tablet (50 mg  total) by mouth 2 (two) times daily., Disp: 60 tablet, Rfl: 1   Review of Systems  Per HPI unless specifically indicated above     Objective:    BP 119/80   Pulse 67   Wt Readings from Last 3 Encounters:  11/05/20 (!) 322 lb 4 oz (146.2 kg)  09/18/20 (!) 330 lb (149.7 kg)  09/10/20 (!) 330 lb 12 oz (150 kg)    Physical Exam Constitutional:      General: He is not in acute distress.    Appearance: He is obese. He is not toxic-appearing.  HENT:     Head: Normocephalic and atraumatic.  Pulmonary:     Effort: No respiratory distress.  Neurological:     Mental Status: He is alert and oriented to person, place, and time.  Psychiatric:        Attention and Perception: Attention normal.        Speech: Speech normal.        Behavior: Behavior is cooperative.     Results for orders placed or performed during the hospital encounter of 12/14/20  Lipid panel  Result Value Ref Range   Cholesterol 140 0 - 200 mg/dL   Triglycerides 68 <888 mg/dL   HDL 62 >91 mg/dL   Total  CHOL/HDL Ratio 2.3 RATIO   VLDL 14 0 - 40 mg/dL   LDL Cholesterol 64 0 - 99 mg/dL  Comprehensive metabolic panel  Result Value Ref Range   Sodium 145 135 - 145 mmol/L   Potassium 4.3 3.5 - 5.1 mmol/L   Chloride 112 (H) 98 - 111 mmol/L   CO2 27 22 - 32 mmol/L   Glucose, Bld 122 (H) 70 - 99 mg/dL   BUN 34 (H) 6 - 20 mg/dL   Creatinine, Ser 6.57 (H) 0.61 - 1.24 mg/dL   Calcium 9.2 8.9 - 90.3 mg/dL   Total Protein 7.1 6.5 - 8.1 g/dL   Albumin 3.9 3.5 - 5.0 g/dL   AST 24 15 - 41 U/L   ALT 21 0 - 44 U/L   Alkaline Phosphatase 71 38 - 126 U/L   Total Bilirubin 0.2 (L) 0.3 - 1.2 mg/dL   GFR, Estimated 51 (L) >60 mL/min   Anion gap 6 5 - 15  Hemoglobin A1c  Result Value Ref Range   Hgb A1c MFr Bld 6.3 (H) 4.8 - 5.6 %   Mean Plasma Glucose 134.11 mg/dL      Assessment & Plan:    Encounter Diagnoses  Name Primary?  . Essential hypertension Yes  . Diabetes mellitus without complication (HCC)   .  Hyperlipidemia, unspecified hyperlipidemia type   . Tobacco use disorder   . Obesity, unspecified classification, unspecified obesity type, unspecified whether serious comorbidity present      -Reviewed labs with pt -Discussed with pt that he needs to Get established with new pcp.  Pt understands that he no longer qualifies to be a pt at [facility] since he now has insurance. -pt to Stop IBU due to renal function.  He can use APAP prn -pt counseled to Stay hydrated with water -encouraged smoking cessation -Discussed renal function.  Discussed that he Will need nephrology if his renal function doesn't improve. -no changes to medications today except for the discontinuing of IBU

## 2020-12-24 ENCOUNTER — Encounter: Payer: Self-pay | Admitting: Student

## 2021-01-16 ENCOUNTER — Ambulatory Visit: Payer: Self-pay | Admitting: Orthopaedic Surgery

## 2021-02-05 ENCOUNTER — Ambulatory Visit (INDEPENDENT_AMBULATORY_CARE_PROVIDER_SITE_OTHER): Payer: Medicaid Other | Admitting: Orthopaedic Surgery

## 2021-02-05 ENCOUNTER — Encounter: Payer: Self-pay | Admitting: Orthopaedic Surgery

## 2021-02-05 VITALS — BP 147/100 | HR 66 | Ht 71.0 in | Wt 314.8 lb

## 2021-02-05 DIAGNOSIS — M48062 Spinal stenosis, lumbar region with neurogenic claudication: Secondary | ICD-10-CM | POA: Diagnosis not present

## 2021-02-05 NOTE — Progress Notes (Signed)
Office Visit Note   Patient: Isaiah Ellis           Date of Birth: 09-08-1961           MRN: [id number] Visit Date: 02/05/2021              Requested by: Jacquelin Hawking, PA-C [address] [place],  [place] [address] PCP: No primary care provider on file.   Assessment & Plan: Visit Diagnoses:  1. Spinal stenosis of lumbar region with neurogenic claudication     Plan: Patient with acquired on congenital moderate to severe stenosis at L3-4 and L4-5.  He is working on walking diet balance doing the best he can at this point is not interested in surgical decompression surgery.  He will call if he changes his mind.  Follow-Up Instructions: Return if symptoms worsen or fail to improve.   Orders:  No orders of the defined types were placed in this encounter.  No orders of the defined types were placed in this encounter.     Procedures: No procedures performed   Clinical Data: No additional findings.   Subjective: Chief Complaint  Patient presents with  . Lower Back - Pain    HPI 60 year old male returns with spinal stenosis lumbar region with neurogenic claudication symptoms.  Patient states he is gradually been working on walking and is and is improving.  He states he is wanting to avoid surgery and has been using a cane has not had any falls.  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  He is working on blood pressure control, weight loss and increasing walking distance which is slowly improving.  Review of Systems update unchanged from 09/18/2020 office visit.   Objective: Vital Signs: BP (!) 147/100   Pulse 66   Ht 5\' 11"  (1.803 m)   Wt (!) 314 lb 12.8 oz (142.8 kg)   BMI 43.91 kg/m   Physical Exam Constitutional:      Appearance: He is well-developed.  HENT:     Head: Normocephalic and atraumatic.  Eyes:     Pupils: Pupils are equal, round, and reactive to light.  Neck:     Thyroid: No  thyromegaly.     Trachea: No tracheal deviation.  Cardiovascular:     Rate and Rhythm: Normal rate.  Pulmonary:     Effort: Pulmonary effort is normal.     Breath sounds: No wheezing.  Abdominal:     General: Bowel sounds are normal.     Palpations: Abdomen is soft.  Skin:    General: Skin is warm and dry.     Capillary Refill: Capillary refill takes less than 2 seconds.  Neurological:     Mental Status: He is alert and oriented to person, place, and time.  Psychiatric:        Behavior: Behavior normal.        Thought Content: Thought content normal.        Judgment: Judgment normal.     Ortho Exam patient is amatory with a cane.  He slowed the upper extremity range of motion antalgic gait.  Balance is fair.  Good capillary refill lower extremities.  Specialty Comments:  No specialty comments available.  Imaging: No results found.   PMFS History: Patient Active Problem List   Diagnosis Date Noted  . Spinal stenosis of lumbar region 09/18/2020  . Hyperlipidemia 05/18/2019  . Diabetes mellitus without complication (HCC) 05/18/2019  . Tobacco  use disorder 05/18/2019  . Essential hypertension 03/28/2019   Past Medical History:  Diagnosis Date  . Balance problems   . Depression   . Diabetes mellitus without complication (HCC)   . Falling   . Gout   . Hyperlipidemia   . Hypertension   . Stroke [facility]) 2005 and 02/2019    Family History  Problem Relation Age of Onset  . Hypertension Mother   . Stroke Mother   . Heart disease Father   . Stroke Father   . Hypertension Father     History reviewed. No pertinent surgical history. Social History   Occupational History  . Not on file  Tobacco Use  . Smoking status: Current Every Day Smoker    Packs/day: 0.25    Years: 40.00    Pack years: 10.00    Types: Cigarettes  . Smokeless tobacco: Never Used  . Tobacco comment: 2-3 cig/ day  Vaping Use  . Vaping Use: Never used  Substance and Sexual Activity  . Alcohol  use: Not Currently    Comment: occasional  . Drug use: Not Currently    Types: Marijuana, Cocaine    Comment: couple of months, no cocaine, 08/08/20 marijuana every now and then  . Sexual activity: Not on file

## 2021-02-25 ENCOUNTER — Other Ambulatory Visit: Payer: Self-pay

## 2021-02-25 ENCOUNTER — Ambulatory Visit (INDEPENDENT_AMBULATORY_CARE_PROVIDER_SITE_OTHER): Payer: Medicaid Other | Admitting: Clinical

## 2021-02-25 DIAGNOSIS — F4310 Post-traumatic stress disorder, unspecified: Secondary | ICD-10-CM
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
## 2021-02-28 ENCOUNTER — Encounter (HOSPITAL_COMMUNITY): Payer: Self-pay

## 2021-02-28 ENCOUNTER — Telehealth: Payer: Self-pay | Admitting: *Deleted

## 2021-02-28 NOTE — Telephone Encounter (Signed)
Should be filled out by PCP. -VRP

## 2021-02-28 NOTE — Telephone Encounter (Signed)
/  Received form from [place] de[facility] health and human services re: [facility] Level of Care Request Worksheet. Patint was seen 08/08/20 and referred ot neurosurgery. Placed form on MD desk for review.

## 2021-03-04 ENCOUNTER — Ambulatory Visit: Payer: Medicaid Other | Admitting: Internal Medicine

## 2021-03-04 NOTE — Telephone Encounter (Signed)
[place] [facility] Human [facility] returned to medical records. Patient to have PCP fill out.

## 2021-03-11 ENCOUNTER — Encounter: Payer: Self-pay | Admitting: Internal Medicine

## 2021-03-11 ENCOUNTER — Other Ambulatory Visit: Payer: Self-pay

## 2021-03-11 ENCOUNTER — Ambulatory Visit: Payer: Medicaid Other | Admitting: Internal Medicine

## 2021-03-11 VITALS — BP 146/88 | HR 62 | Resp 16 | Ht 71.0 in | Wt 313.0 lb

## 2021-03-11 DIAGNOSIS — Z7689 Persons encountering health services in other specified circumstances: Secondary | ICD-10-CM | POA: Diagnosis not present

## 2021-03-11 DIAGNOSIS — E119 Type 2 diabetes mellitus without complications: Secondary | ICD-10-CM

## 2021-03-11 DIAGNOSIS — E559 Vitamin D deficiency, unspecified: Secondary | ICD-10-CM

## 2021-03-11 DIAGNOSIS — I10 Essential (primary) hypertension: Secondary | ICD-10-CM | POA: Diagnosis not present

## 2021-03-11 DIAGNOSIS — F17200 Nicotine dependence, unspecified, uncomplicated: Secondary | ICD-10-CM

## 2021-03-11 DIAGNOSIS — E785 Hyperlipidemia, unspecified: Secondary | ICD-10-CM

## 2021-03-11 DIAGNOSIS — Z1211 Encounter for screening for malignant neoplasm of colon: Secondary | ICD-10-CM

## 2021-03-11 DIAGNOSIS — Z0271 Encounter for disability determination: Secondary | ICD-10-CM

## 2021-03-11 DIAGNOSIS — M48062 Spinal stenosis, lumbar region with neurogenic claudication: Secondary | ICD-10-CM

## 2021-03-11 DIAGNOSIS — Z8673 Personal history of transient ischemic attack (TIA), and cerebral infarction without residual deficits: Secondary | ICD-10-CM

## 2021-03-11 MED ORDER — ASPIRIN 81 MG PO TBEC: 81.0000 mg | DELAYED_RELEASE_TABLET | Freq: Every day | ORAL | 12 refills | Status: AC

## 2021-03-11 NOTE — Patient Instructions (Signed)
Please continue to take medications as prescribed.  Please continue to follow low carb and low salt diet and perform moderate exercise/walking at least 150 mins/week.

## 2021-03-12 DIAGNOSIS — E559 Vitamin D deficiency, unspecified: Secondary | ICD-10-CM | POA: Insufficient documentation

## 2021-03-12 LAB — CMP14+EGFR
ALT: 10 IU/L (ref 0–44)
AST: 14 IU/L (ref 0–40)
Albumin/Globulin Ratio: 1.8 (ref 1.2–2.2)
Albumin: 4.7 g/dL (ref 3.8–4.9)
Alkaline Phosphatase: 108 IU/L (ref 44–121)
BUN/Creatinine Ratio: 12 (ref 10–24)
BUN: 14 mg/dL (ref 8–27)
Bilirubin Total: 0.3 mg/dL (ref 0.0–1.2)
CO2: 19 mmol/L — ABNORMAL LOW (ref 20–29)
Calcium: 10 mg/dL (ref 8.6–10.2)
Chloride: 105 mmol/L (ref 96–106)
Creatinine, Ser: 1.21 mg/dL (ref 0.76–1.27)
Globulin, Total: 2.6 g/dL (ref 1.5–4.5)
Glucose: 85 mg/dL (ref 65–99)
Potassium: 5.1 mmol/L (ref 3.5–5.2)
Sodium: 143 mmol/L (ref 134–144)
Total Protein: 7.3 g/dL (ref 6.0–8.5)
eGFR: 69 mL/min/{1.73_m2} (ref >59)

## 2021-03-12 LAB — VITAMIN D 25 HYDROXY (VIT D DEFICIENCY, FRACTURES): Vit D, 25-Hydroxy: 12.2 ng/mL — ABNORMAL LOW (ref 30.0–100.0)

## 2021-03-12 LAB — CBC WITH DIFFERENTIAL/PLATELET
Basophils Absolute: 0.2 10*3/uL (ref 0.0–0.2)
Basos: 2 %
EOS (ABSOLUTE): 0.9 10*3/uL — ABNORMAL HIGH (ref 0.0–0.4)
Eos: 9 %
Hematocrit: 44.4 % (ref 37.5–51.0)
Hemoglobin: 13.8 g/dL (ref 13.0–17.7)
Immature Grans (Abs): 0 10*3/uL (ref 0.0–0.1)
Immature Granulocytes: 0 %
Lymphocytes Absolute: 2 10*3/uL (ref 0.7–3.1)
Lymphs: 21 %
MCH: 24.3 pg — ABNORMAL LOW (ref 26.6–33.0)
MCHC: 31.1 g/dL — ABNORMAL LOW (ref 31.5–35.7)
MCV: 78 fL — ABNORMAL LOW (ref 79–97)
Monocytes Absolute: 1.1 10*3/uL — ABNORMAL HIGH (ref 0.1–0.9)
Monocytes: 11 %
Neutrophils Absolute: 5.3 10*3/uL (ref 1.4–7.0)
Neutrophils: 57 %
Platelets: 254 10*3/uL (ref 150–450)
RBC: 5.69 x10E6/uL (ref 4.14–5.80)
RDW: 17.3 % — ABNORMAL HIGH (ref 11.6–15.4)
WBC: 9.4 10*3/uL (ref 3.4–10.8)

## 2021-03-12 LAB — HEMOGLOBIN A1C
Est. average glucose Bld gHb Est-mCnc: 120 mg/dL
Hgb A1c MFr Bld: 5.8 % — ABNORMAL HIGH (ref 4.8–5.6)

## 2021-03-12 LAB — LIPID PANEL
Chol/HDL Ratio: 2.8 ratio (ref 0.0–5.0)
Cholesterol, Total: 155 mg/dL (ref 100–199)
HDL: 56 mg/dL (ref >39)
LDL Chol Calc (NIH): 86 mg/dL (ref 0–99)
Triglycerides: 66 mg/dL (ref 0–149)
VLDL Cholesterol Cal: 13 mg/dL (ref 5–40)

## 2021-03-12 LAB — TSH: TSH: 0.797 u[IU]/mL (ref 0.450–4.500)

## 2021-03-12 MED ORDER — VITAMIN D (ERGOCALCIFEROL) 1.25 MG (50000 UNIT) PO CAPS: 50000.0000 [IU] | ORAL_CAPSULE | ORAL | 5 refills | Status: DC

## 2021-03-13 ENCOUNTER — Telehealth: Payer: Self-pay

## 2021-03-13 NOTE — Telephone Encounter (Signed)
Cap program forms  Noted Copied Sleeved

## 2021-03-15 NOTE — Progress Notes (Signed)
New Patient Office Visit  Subjective:  [name]: Isaiah Ellis, male    DOB: 10/24/1961  Age: 60 y.o. MRN: [id number]  CC:  Chief Complaint  Patient presents with  . New Patient (Initial Visit)    New patient had a stroke in 2017 ever since then the right side has been bothering him especially his right knee    HPI Isaiah Ellis is a 60 year old male with past medical history of HTN, type II DM, CVA, HLD, lumbar spinal stenosis and tobacco abuse who presents for establishing care.  HTN: His blood pressure was elevated in the office today.  He takes his medications regularly.  He denies any headache, dizziness, chest pain, dyspnea or palpitations.  DM: He takes metformin regularly.  Denies any polyuria or polyphagia.  Lumbar spinal stenosis, right knee pain: He reports chronic low back pain with intermittent numbness in the LE.  He has had spine surgery evaluation in the past.  He did not opt for decompression surgery.  He also reports right knee pain, for which he takes aspirin/Tylenol.  He uses a cane for walking support.  He has had 2 doses of COVID-vaccine.    Past Medical History:  Diagnosis Date  . Balance problems   . Depression   . Diabetes mellitus without complication ([place])   . Falling   . Gout   . Hyperlipidemia   . Hypertension   . Stroke [facility]) 2005 and 02/2019    History reviewed. No pertinent surgical history.  Family History  Problem Relation Age of Onset  . Hypertension Mother   . Stroke Mother   . Heart disease Father   . Stroke Father   . Hypertension Father     Social History   Socioeconomic History  . Marital status: Single    Spouse name: Not on file  . Number of children: 0  . Years of education: Not on file  . Highest education level: Associate degree: academic program  Occupational History  . Not on file  Tobacco Use  . Smoking status: Current Every Day Smoker    Packs/day: 0.25    Years: 40.00    Pack years: 10.00    Types:  Cigarettes  . Smokeless tobacco: Never Used  . Tobacco comment: 2-3 cig/ day  Vaping Use  . Vaping Use: Never used  Substance and Sexual Activity  . Alcohol use: Not Currently    Comment: occasional  . Drug use: Not Currently    Types: Marijuana, Cocaine    Comment: couple of months, no cocaine, 08/08/20 marijuana every now and then  . Sexual activity: Not on file  Other Topics Concern  . Not on file  Social History Narrative   Lives alone   No caffeine   Social Determinants of Health   Financial Resource Strain: Not on file  Food Insecurity: Not on file  Transportation Needs: Not on file  Physical Activity: Not on file  Stress: Not on file  Social Connections: Not on file  Intimate Partner Violence: Not on file    ROS Review of Systems  Constitutional: Negative for chills and fever.  HENT: Negative for congestion and sore throat.   Eyes: Negative for pain and discharge.  Respiratory: Negative for cough and shortness of breath.   Cardiovascular: Negative for chest pain and palpitations.  Gastrointestinal: Negative for constipation, diarrhea, nausea and vomiting.  Endocrine: Negative for polydipsia and polyuria.  Genitourinary: Negative for dysuria and hematuria.  Musculoskeletal: Positive for arthralgias.  Negative for neck pain and neck stiffness.  Skin: Negative for rash.  Neurological: Positive for weakness and numbness (LE, intermittent). Negative for dizziness and headaches.  Psychiatric/Behavioral: Negative for agitation and behavioral problems.    Objective:   Today's Vitals: BP (!) 146/88 (BP Location: Right Arm, Patient Position: Sitting, Cuff Size: Normal)   Pulse 62   Resp 16   Ht _0  (1.803 m)   Wt (!) 313 lb (142 kg)   SpO2 98%   BMI 43.65 kg/m   Physical Exam Vitals reviewed.  Constitutional:      General: He is not in acute distress.    Appearance: He is obese. He is not diaphoretic.  HENT:     Head: Normocephalic and atraumatic.      Nose: Nose normal.     Mouth/Throat:     Mouth: Mucous membranes are moist.  Eyes:     General: No scleral icterus.    Extraocular Movements: Extraocular movements intact.     Pupils: Pupils are equal, round, and reactive to light.  Cardiovascular:     Rate and Rhythm: Normal rate and regular rhythm.     Pulses: Normal pulses.     Heart sounds: Normal heart sounds. No murmur heard.   Pulmonary:     Breath sounds: Normal breath sounds. No wheezing or rales.  Abdominal:     Palpations: Abdomen is soft.     Tenderness: There is no abdominal tenderness.  Musculoskeletal:     Cervical back: Neck supple. No tenderness.     Right lower leg: No edema.     Left lower leg: No edema.  Skin:    General: Skin is warm.     Findings: No rash.  Neurological:     General: No focal deficit present.     Mental Status: He is alert and oriented to person, place, and time.  Psychiatric:        Mood and Affect: Mood normal.        Behavior: Behavior normal.     Assessment & Plan:   Problem List Items Addressed This Visit      Encounter to establish care - Primary   Care established Previous chart reviewed History and medications reviewed with the patient     Relevant Orders  CBC with Differential/Platelet (Completed)  CMP14+EGFR (Completed)  TSH (Completed)  Vitamin D (25 hydroxy) (Completed)    Cardiovascular and Mediastinum   Essential hypertension    BP Readings from Last 1 Encounters:  03/11/21 (!) 146/88   uncontrolled with Amlodipine, Lisinopril and Metoprolol Would avoid changing medication as it could be elevated due to first visit related anxiety Counseled for compliance with the medications Advised DASH diet and moderate exercise/walking, at least 150 mins/week       Relevant Medications   aspirin 81 MG EC tablet   Other Relevant Orders   CBC with Differential/Platelet (Completed)   CMP14+EGFR (Completed)   TSH (Completed)     Endocrine   Diabetes mellitus  without complication ([place])    Lab Results  Component Value Date   HGBA1C 5.8 (H) 03/11/2021   Well-controlled with Metformin On statin and ACEi Check CMP and HbA1C Advised to follow diabetic diet Diabetic foot exam: Today Diabetic eye exam: Advised to follow up with Ophthalmology for diabetic eye exam       Relevant Medications   aspirin 81 MG EC tablet   Other Relevant Orders   CMP14+EGFR (Completed)   Lipid panel (Completed)  Hemoglobin A1c (Completed)     Other   Hyperlipidemia    On statin Check lipid profile      Relevant Medications   aspirin 81 MG EC tablet   Other Relevant Orders   Lipid panel (Completed)   Tobacco use disorder    Asked about quitting: confirms that he currently smokes cigarettes Advise to quit smoking: Educated about QUITTING to reduce the risk of cancer, cardio and cerebrovascular disease. Assess willingness: Unwilling to quit at this time, but is working on cutting back. Assist with counseling and pharmacotherapy: Counseled for 5 minutes and literature provided. Arrange for follow up: Follow up in 3 months and continue to offer help.      Spinal stenosis of lumbar region    Has had Spine surgery evaluation Did not opt for decompression surgery Tylenol/Ibuprofen PRN         History of CVA (cerebrovascular accident)    Previous MRI reviewed On Aspirin and statin Follows up with neurology      Relevant Medications   aspirin 81 MG EC tablet   Vitamin D deficiency    Last vitamin D Lab Results  Component Value Date   VD25OH 12.2 (L) 03/11/2021   Started Vitamin D 50,000 IU every week      Relevant Medications   Vitamin D, Ergocalciferol, (DRISDOL) 1.25 MG (50000 UNIT) CAPS capsule    Other Visit Diagnoses    Special screening for malignant neoplasms, colon       Relevant Orders   Ambulatory referral to Gastroenterology      Outpatient Encounter Medications as of 03/11/2021  Medication Sig  . amLODipine (NORVASC) 10 MG  tablet Take 1 tablet (10 mg total) by mouth daily.  Marland Kitchen aspirin 81 MG EC tablet Take 1 tablet (81 mg total) by mouth daily. Swallow whole.  Marland Kitchen atorvastatin (LIPITOR) 20 MG tablet TAKE 1 Tablet BY MOUTH ONCE EVERY DAY  . lisinopril (ZESTRIL) 20 MG tablet TAKE 1 Tablet BY MOUTH ONCE EVERY DAY  . metFORMIN (GLUCOPHAGE) 500 MG tablet TAKE 1 Tablet  BY MOUTH TWICE DAILY WITH A MEAL  . metoprolol tartrate (LOPRESSOR) 50 MG tablet Take 1 tablet (50 mg total) by mouth 2 (two) times daily.  . Vitamin D, Ergocalciferol, (DRISDOL) 1.25 MG (50000 UNIT) CAPS capsule Take 1 capsule (50,000 Units total) by mouth every 7 (seven) days.  . [DISCONTINUED] aspirin 325 MG tablet Take 1 tablet (325 mg total) by mouth daily.  . [DISCONTINUED] ibuprofen (ADVIL,MOTRIN) 200 MG tablet Take 800 mg by mouth every 6 (six) hours as needed. Pain. (Patient not taking: Reported on 03/11/2021)   No facility-administered encounter medications on file as of 03/11/2021.    Follow-up: Return in about 6 weeks (around 04/22/2021) for HTN and PCV13.   Lindell Spar, MD

## 2021-03-15 NOTE — Assessment & Plan Note (Signed)
Care established Previous chart reviewed History and medications reviewed with the patient 

## 2021-03-15 NOTE — Assessment & Plan Note (Signed)
Lab Results  Component Value Date   HGBA1C 5.8 (H) 03/11/2021   Well-controlled with Metformin On statin and ACEi Check CMP and HbA1C Advised to follow diabetic diet Diabetic foot exam: Today Diabetic eye exam: Advised to follow up with Ophthalmology for diabetic eye exam

## 2021-03-15 NOTE — Assessment & Plan Note (Signed)
BP Readings from Last 1 Encounters:  03/11/21 (!) 146/88   uncontrolled with Amlodipine, Lisinopril and Metoprolol Would avoid changing medication as it could be elevated due to first visit related anxiety Counseled for compliance with the medications Advised DASH diet and moderate exercise/walking, at least 150 mins/week

## 2021-03-15 NOTE — Assessment & Plan Note (Addendum)
Previous MRI reviewed On Aspirin and statin Follows up with neurology

## 2021-03-15 NOTE — Assessment & Plan Note (Signed)
Has had Spine surgery evaluation Did not opt for decompression surgery Tylenol/Ibuprofen PRN

## 2021-03-15 NOTE — Assessment & Plan Note (Signed)
On statin Check lipid profile 

## 2021-03-15 NOTE — Assessment & Plan Note (Signed)
Last vitamin D Lab Results  Component Value Date   VD25OH 12.2 (L) 03/11/2021   Started Vitamin D 50,000 IU every week

## 2021-03-15 NOTE — Assessment & Plan Note (Signed)
Asked about quitting: confirms that he currently smokes cigarettes Advise to quit smoking: Educated about QUITTING to reduce the risk of cancer, cardio and cerebrovascular disease. Assess willingness: Unwilling to quit at this time, but is working on cutting back. Assist with counseling and pharmacotherapy: Counseled for 5 minutes and literature provided. Arrange for follow up: Follow up in 3 months and continue to offer help. 

## 2021-03-18 DIAGNOSIS — Z0279 Encounter for issue of other medical certificate: Secondary | ICD-10-CM

## 2021-03-19 NOTE — Telephone Encounter (Signed)
Complete

## 2021-03-20 NOTE — Telephone Encounter (Signed)
Forms were faxed and patient picked up a copy.

## 2021-03-25 ENCOUNTER — Other Ambulatory Visit: Payer: Self-pay

## 2021-03-25 ENCOUNTER — Ambulatory Visit (HOSPITAL_COMMUNITY): Payer: Medicaid Other | Admitting: Clinical

## 2021-04-02 ENCOUNTER — Ambulatory Visit (HOSPITAL_COMMUNITY): Payer: Medicaid Other | Admitting: Clinical

## 2021-04-02 ENCOUNTER — Other Ambulatory Visit: Payer: Self-pay

## 2021-04-02 ENCOUNTER — Telehealth (HOSPITAL_COMMUNITY): Payer: Self-pay | Admitting: Clinical

## 2021-04-02 NOTE — Telephone Encounter (Signed)
patient is having phone issue and he will call back to resch appt when he gets his phone situation fixed. He is saying that one minute his phone is working and the next its not

## 2021-04-11 ENCOUNTER — Encounter: Payer: Self-pay | Admitting: *Deleted

## 2021-04-22 ENCOUNTER — Encounter: Payer: Self-pay | Admitting: Nurse Practitioner

## 2021-04-22 ENCOUNTER — Other Ambulatory Visit: Payer: Self-pay

## 2021-04-22 ENCOUNTER — Ambulatory Visit: Payer: Medicaid Other | Admitting: Nurse Practitioner

## 2021-04-22 ENCOUNTER — Ambulatory Visit: Payer: Medicaid Other | Admitting: Internal Medicine

## 2021-04-22 VITALS — BP 108/74 | HR 61 | Resp 18 | Ht 72.0 in | Wt 308.1 lb

## 2021-04-22 DIAGNOSIS — I10 Essential (primary) hypertension: Secondary | ICD-10-CM

## 2021-04-22 DIAGNOSIS — E559 Vitamin D deficiency, unspecified: Secondary | ICD-10-CM

## 2021-04-22 DIAGNOSIS — E785 Hyperlipidemia, unspecified: Secondary | ICD-10-CM

## 2021-04-22 DIAGNOSIS — E119 Type 2 diabetes mellitus without complications: Secondary | ICD-10-CM

## 2021-04-22 DIAGNOSIS — Z139 Encounter for screening, unspecified: Secondary | ICD-10-CM | POA: Diagnosis not present

## 2021-04-22 NOTE — Patient Instructions (Signed)
Please have fasting labs drawn 2-3 days prior to your appointment so we can discuss the results during your office visit.  

## 2021-04-22 NOTE — Progress Notes (Signed)
Acute Office Visit  Subjective:    [name]: Isaiah Ellis, male    DOB: 1961-07-14, 60 y.o.   MRN: [id number]  Chief Complaint  Patient presents with  . Follow-up    6 week follow up bp check has been running high and lower has readings with him     HPI Patient is in today for BP check and Prevnar-13 injection. At his last OV, he was taking amlodipine, lisinopril, and metoprolol, and his BP was 146/88. His home readings have been a little high 130-150s/80-110s.  Today, BP is great.  Past Medical History:  Diagnosis Date  . Balance problems   . Depression   . Diabetes mellitus without complication ([place])   . Falling   . Gout   . Hyperlipidemia   . Hypertension   . Stroke [facility]) 2005 and 02/2019    History reviewed. No pertinent surgical history.  Family History  Problem Relation Age of Onset  . Hypertension Mother   . Stroke Mother   . Heart disease Father   . Stroke Father   . Hypertension Father     Social History   Socioeconomic History  . Marital status: Single    Spouse name: Not on file  . Number of children: 0  . Years of education: Not on file  . Highest education level: Associate degree: academic program  Occupational History  . Not on file  Tobacco Use  . Smoking status: Current Every Day Smoker    Packs/day: 0.25    Years: 40.00    Pack years: 10.00    Types: Cigarettes  . Smokeless tobacco: Never Used  . Tobacco comment: 2-3 cig/ day  Vaping Use  . Vaping Use: Never used  Substance and Sexual Activity  . Alcohol use: Not Currently    Comment: occasional  . Drug use: Not Currently    Types: Marijuana, Cocaine    Comment: couple of months, no cocaine, 08/08/20 marijuana every now and then  . Sexual activity: Not on file  Other Topics Concern  . Not on file  Social History Narrative   Lives alone   No caffeine   Social Determinants of Health   Financial Resource Strain: Not on file  Food Insecurity: Not on file  Transportation  Needs: Not on file  Physical Activity: Not on file  Stress: Not on file  Social Connections: Not on file  Intimate Partner Violence: Not on file    Outpatient Medications Prior to Visit  Medication Sig Dispense Refill  . amLODipine (NORVASC) 10 MG tablet Take 1 tablet (10 mg total) by mouth daily. 30 tablet 1  . aspirin 81 MG EC tablet Take 1 tablet (81 mg total) by mouth daily. Swallow whole. 30 tablet 12  . atorvastatin (LIPITOR) 20 MG tablet TAKE 1 Tablet BY MOUTH ONCE EVERY DAY 30 tablet 1  . lisinopril (ZESTRIL) 20 MG tablet TAKE 1 Tablet BY MOUTH ONCE EVERY DAY 30 tablet 1  . metFORMIN (GLUCOPHAGE) 500 MG tablet TAKE 1 Tablet  BY MOUTH TWICE DAILY WITH A MEAL 60 tablet 1  . metoprolol tartrate (LOPRESSOR) 50 MG tablet Take 1 tablet (50 mg total) by mouth 2 (two) times daily. 60 tablet 1  . Vitamin D, Ergocalciferol, (DRISDOL) 1.25 MG (50000 UNIT) CAPS capsule Take 1 capsule (50,000 Units total) by mouth every 7 (seven) days. 5 capsule 5   No facility-administered medications prior to visit.    Allergies  Allergen Reactions  . Bee Venom Swelling  Localized     Review of Systems  Constitutional: Negative.   Respiratory: Negative.   Cardiovascular: Negative.   Musculoskeletal: Negative.   Psychiatric/Behavioral: Negative.        Objective:    Physical Exam Constitutional:      Appearance: Normal appearance. He is obese.  Cardiovascular:     Rate and Rhythm: Normal rate and regular rhythm.     Pulses: Normal pulses.     Heart sounds: Normal heart sounds.  Pulmonary:     Effort: Pulmonary effort is normal.     Breath sounds: Normal breath sounds.  Musculoskeletal:     Comments: Uses cane for ambulation  Neurological:     Mental Status: He is alert.  Psychiatric:        Mood and Affect: Mood normal.        Behavior: Behavior normal.        Thought Content: Thought content normal.        Judgment: Judgment normal.     Comments: Verbose     BP 108/74 (BP  Location: Right Arm, Patient Position: Sitting, Cuff Size: Normal)   Pulse 61   Resp 18   Ht 6' (1.829 m)   Wt (!) 308 lb 1.9 oz (139.8 kg)   SpO2 98%   BMI 41.79 kg/m  Wt Readings from Last 3 Encounters:  04/22/21 (!) 308 lb 1.9 oz (139.8 kg)  03/11/21 (!) 313 lb (142 kg)  02/05/21 (!) 314 lb 12.8 oz (142.8 kg)    Health Maintenance Due  Topic Date Due  . PNEUMOCOCCAL POLYSACCHARIDE VACCINE AGE 75-64 HIGH RISK  Never done  . Pneumococcal Vaccine 17-2 Years old (1 of 2 - PPSV23) Never done  . HIV Screening  Never done  . Hepatitis C Screening  Never done  . COLONOSCOPY (Pts 45-33yr Insurance coverage will need to be confirmed)  Never done  . Zoster Vaccines- Shingrix (1 of 2) Never done    There are no preventive care reminders to display for this patient.   Lab Results  Component Value Date   TSH 0.797 03/11/2021   Lab Results  Component Value Date   WBC 9.4 03/11/2021   HGB 13.8 03/11/2021   HCT 44.4 03/11/2021   MCV 78 (L) 03/11/2021   PLT 254 03/11/2021   Lab Results  Component Value Date   NA 143 03/11/2021   K 5.1 03/11/2021   CO2 19 (L) 03/11/2021   GLUCOSE 85 03/11/2021   BUN 14 03/11/2021   CREATININE 1.21 03/11/2021   BILITOT 0.3 03/11/2021   ALKPHOS 108 03/11/2021   AST 14 03/11/2021   ALT 10 03/11/2021   PROT 7.3 03/11/2021   ALBUMIN 4.7 03/11/2021   CALCIUM 10.0 03/11/2021   ANIONGAP 6 12/14/2020   EGFR 69 03/11/2021   Lab Results  Component Value Date   CHOL 155 03/11/2021   Lab Results  Component Value Date   HDL 56 03/11/2021   Lab Results  Component Value Date   LDLCALC 86 03/11/2021   Lab Results  Component Value Date   TRIG 66 03/11/2021   Lab Results  Component Value Date   CHOLHDL 2.8 03/11/2021   Lab Results  Component Value Date   HGBA1C 5.8 (H) 03/11/2021       Assessment & Plan:   Problem List Items Addressed This Visit      Cardiovascular and Mediastinum   Essential hypertension    BP Readings from Last  3 Encounters:  04/22/21 108/74  03/11/21 (!) 146/88  02/05/21 (!) 147/100   -BP great today -home readings have been up and down; unsure if stress vs user error d/t cuff -no med changes      Relevant Orders   CBC with Differential/Platelet   CMP14+EGFR   Lipid Panel With LDL/HDL Ratio     Endocrine   Diabetes mellitus without complication ([place])    -will check A1c with next set of labs      Relevant Orders   Hemoglobin A1c   Lipid Panel With LDL/HDL Ratio     Other   Hyperlipidemia    -labs ordered      Vitamin D deficiency - Primary   Relevant Orders   VITAMIN D 25 Hydroxy (Vit-D Deficiency, Fractures)   Screening due    -will screen for HCV and HIV with next set of labs      Relevant Orders   HIV Antibody (routine testing w rflx)   Hepatitis C antibody       No orders of the defined types were placed in this encounter.    Noreene Larsson, NP

## 2021-04-22 NOTE — Assessment & Plan Note (Signed)
BP Readings from Last 3 Encounters:  04/22/21 108/74  03/11/21 (!) 146/88  02/05/21 (!) 147/100   -BP great today -home readings have been up and down; unsure if stress vs user error d/t cuff -no med changes

## 2021-04-22 NOTE — Assessment & Plan Note (Signed)
-  will check A1c with next set of labs 

## 2021-04-22 NOTE — Assessment & Plan Note (Signed)
labs ordered

## 2021-04-22 NOTE — Assessment & Plan Note (Signed)
-  will screen for HCV and HIV with next set of labs 

## 2021-04-29 ENCOUNTER — Ambulatory Visit: Payer: Medicaid Other | Admitting: Nurse Practitioner

## 2021-05-01 NOTE — Congregational Nurse Program (Signed)
Completed  weekly remote monitoring BP health coaching session with pt   Reviewed BP personal goals and steps to make sure pt was properly monitoring BP and weight   Successes identified -continues to monitor BP daily -takes BP meds consistently -continues weight loss -reduce amount of cigarettes   Barriers identified  -continues learning how to accept gratitude and joy  -struggling with accepting changing expectations -continues to smoke    Action plan recommended -continue  with above successes -attempt to change expectations and practice gratitude and joy more -continue to work on reducing cigarette use 

## 2021-05-01 NOTE — Telephone Encounter (Signed)
Conducted remote monitoring BP health coaching on today.  Success: continues to monitor bp consistently as well as weight; no problems with use of equipment  Barriers;  Smoking  Stress management  Action  Plan -work on reducing cigarette usage -work on alternate methods of stress management (practicing gratitude more and talking outload to health coach about stress triggers and figuring out stress triggers

## 2021-05-01 NOTE — Telephone Encounter (Signed)
Completed  weekly remote monitoring BP health coaching session with pt   Reviewed BP personal goals and steps to make sure pt was properly monitoring BP and weight   Successes identified -continues to monitor BP daily -takes BP meds consistently -continues weight loss -reduce amount of cigarettes   Barriers identified  -continues learning how to accept gratitude and joy  -struggling with accepting changing expectations -continues to smoke    Action plan recommended -continue  with above successes -attempt to change expectations and practice gratitude and joy more -continue to work on reducing cigarette use

## 2021-06-17 ENCOUNTER — Telehealth: Payer: Self-pay

## 2021-06-17 ENCOUNTER — Other Ambulatory Visit: Payer: Self-pay | Admitting: Physician Assistant

## 2021-06-17 ENCOUNTER — Other Ambulatory Visit: Payer: Self-pay | Admitting: Internal Medicine

## 2021-06-17 ENCOUNTER — Other Ambulatory Visit: Payer: Self-pay | Admitting: *Deleted

## 2021-06-17 DIAGNOSIS — E119 Type 2 diabetes mellitus without complications: Secondary | ICD-10-CM

## 2021-06-17 DIAGNOSIS — I10 Essential (primary) hypertension: Secondary | ICD-10-CM

## 2021-06-17 DIAGNOSIS — E785 Hyperlipidemia, unspecified: Secondary | ICD-10-CM

## 2021-06-17 MED ORDER — LISINOPRIL 20 MG PO TABS: ORAL_TABLET | ORAL | 1 refills | Status: DC

## 2021-06-17 MED ORDER — METOPROLOL TARTRATE 50 MG PO TABS: 50.0000 mg | ORAL_TABLET | Freq: Two times a day (BID) | ORAL | 1 refills | Status: DC

## 2021-06-17 MED ORDER — AMLODIPINE BESYLATE 10 MG PO TABS: 10.0000 mg | ORAL_TABLET | Freq: Every day | ORAL | 1 refills | Status: DC

## 2021-06-17 MED ORDER — ATORVASTATIN CALCIUM 20 MG PO TABS: ORAL_TABLET | ORAL | 1 refills | Status: DC

## 2021-06-17 MED ORDER — METFORMIN HCL 500 MG PO TABS: ORAL_TABLET | ORAL | 1 refills | Status: DC

## 2021-06-17 NOTE — Telephone Encounter (Signed)
Patient calling needs refills on all 5 of his medications ph# [phone]

## 2021-06-17 NOTE — Telephone Encounter (Signed)
Pt medication sent to [facility] pharmacy  

## 2021-07-05 ENCOUNTER — Other Ambulatory Visit: Payer: Self-pay

## 2021-07-05 DIAGNOSIS — Z8673 Personal history of transient ischemic attack (TIA), and cerebral infarction without residual deficits: Secondary | ICD-10-CM

## 2021-07-05 DIAGNOSIS — M48062 Spinal stenosis, lumbar region with neurogenic claudication: Secondary | ICD-10-CM

## 2021-07-05 MED ORDER — UNABLE TO FIND: 0 refills | Status: DC

## 2021-07-18 ENCOUNTER — Ambulatory Visit (INDEPENDENT_AMBULATORY_CARE_PROVIDER_SITE_OTHER): Payer: Medicaid Other | Admitting: Clinical

## 2021-07-18 ENCOUNTER — Other Ambulatory Visit: Payer: Self-pay

## 2021-07-18 DIAGNOSIS — F4310 Post-traumatic stress disorder, unspecified: Secondary | ICD-10-CM
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
## 2021-07-23 ENCOUNTER — Ambulatory Visit: Payer: Medicare Other | Admitting: Internal Medicine

## 2021-07-23 ENCOUNTER — Other Ambulatory Visit: Payer: Self-pay

## 2021-07-23 ENCOUNTER — Encounter: Payer: Self-pay | Admitting: Internal Medicine

## 2021-07-23 VITALS — BP 140/88 | HR 66 | Temp 98.3°F | Resp 18 | Ht 72.0 in | Wt 306.0 lb

## 2021-07-23 DIAGNOSIS — F17210 Nicotine dependence, cigarettes, uncomplicated: Secondary | ICD-10-CM | POA: Diagnosis not present

## 2021-07-23 DIAGNOSIS — M48062 Spinal stenosis, lumbar region with neurogenic claudication: Secondary | ICD-10-CM

## 2021-07-23 DIAGNOSIS — I10 Essential (primary) hypertension: Secondary | ICD-10-CM | POA: Diagnosis not present

## 2021-07-23 DIAGNOSIS — E785 Hyperlipidemia, unspecified: Secondary | ICD-10-CM

## 2021-07-23 DIAGNOSIS — Z8673 Personal history of transient ischemic attack (TIA), and cerebral infarction without residual deficits: Secondary | ICD-10-CM

## 2021-07-23 DIAGNOSIS — E559 Vitamin D deficiency, unspecified: Secondary | ICD-10-CM

## 2021-07-23 DIAGNOSIS — Z23 Encounter for immunization: Secondary | ICD-10-CM

## 2021-07-23 DIAGNOSIS — E119 Type 2 diabetes mellitus without complications: Secondary | ICD-10-CM

## 2021-07-23 DIAGNOSIS — F17200 Nicotine dependence, unspecified, uncomplicated: Secondary | ICD-10-CM

## 2021-07-23 MED ORDER — METOPROLOL TARTRATE 50 MG PO TABS: 50.0000 mg | ORAL_TABLET | Freq: Two times a day (BID) | ORAL | 2 refills | Status: DC

## 2021-07-23 MED ORDER — LISINOPRIL 20 MG PO TABS: ORAL_TABLET | ORAL | 2 refills | Status: DC

## 2021-07-23 MED ORDER — VITAMIN D (ERGOCALCIFEROL) 1.25 MG (50000 UNIT) PO CAPS: 50000.0000 [IU] | ORAL_CAPSULE | ORAL | 5 refills | Status: DC

## 2021-07-23 MED ORDER — ATORVASTATIN CALCIUM 20 MG PO TABS: ORAL_TABLET | ORAL | 1 refills | Status: DC

## 2021-07-23 MED ORDER — METFORMIN HCL 500 MG PO TABS: ORAL_TABLET | ORAL | 2 refills | Status: DC

## 2021-07-23 MED ORDER — AMLODIPINE BESYLATE 10 MG PO TABS: 10.0000 mg | ORAL_TABLET | Freq: Every day | ORAL | 2 refills | Status: DC

## 2021-07-23 NOTE — Patient Instructions (Addendum)
Please continue taking medications as prescribed.  Please continue to follow low carb and low salt diet and ambulate as tolerated.  Please get fasting blood tests before the next visit.

## 2021-07-23 NOTE — Assessment & Plan Note (Signed)
On Aspirin and statin Follows up with Neurology

## 2021-07-23 NOTE — Addendum Note (Signed)
Addended by: Dellia Cloud on: 07/23/2021 02:12 PM   Modules accepted: Orders

## 2021-07-23 NOTE — Assessment & Plan Note (Signed)
BP Readings from Last 1 Encounters:  07/23/21 140/88   Well-controlled with Amlodipine, Lisinopril and Metoprolol Counseled for compliance with the medications Advised DASH diet and moderate exercise/walking, at least 150 mins/week

## 2021-07-23 NOTE — Progress Notes (Addendum)
Established Patient Office Visit  Subjective:  [name]: Isaiah Ellis, male    DOB: 1961/06/06  Age: 60 y.o. MRN: [id number]  CC:  Chief Complaint  Patient presents with   Hypertension   Cerebrovascular Accident    HPI Isaiah Ellis is a 60 year old male with past medical history of HTN, type II DM, CVA, HLD, lumbar spinal stenosis and tobacco abuse who  presents for follow up of his chronic medical conditions.  HTN: BP is well-controlled. Takes medications regularly. Patient denies headache, dizziness, chest pain, dyspnea or palpitations. He has kept records of home BP readings in his phone, which have been reviewed - around 110-130/70-90.  Type 2 DM: He takes metformin regularly.  Denies any polyuria or polyphagia.  Vitamin D deficiency: He has not started Vitamin D supplements yet.  He received flu vaccine in the office today.   Past Medical History:  Diagnosis Date   Balance problems    Depression    Diabetes mellitus without complication ([place])    Falling    Gout    Hyperlipidemia    Hypertension    Stroke ([place]) 2005 and 02/2019    History reviewed. No pertinent surgical history.  Family History  Problem Relation Age of Onset   Hypertension Mother    Stroke Mother    Heart disease Father    Stroke Father    Hypertension Father     Social History   Socioeconomic History   Marital status: Single    Spouse name: Not on file   Number of children: 0   Years of education: Not on file   Highest education level: Associate degree: academic program  Occupational History   Not on file  Tobacco Use   Smoking status: Every Day    Packs/day: 0.25    Years: 40.00    Pack years: 10.00    Types: Cigarettes   Smokeless tobacco: Never   Tobacco comments:    2-3 cig/ day  Vaping Use   Vaping Use: Never used  Substance and Sexual Activity   Alcohol use: Not Currently    Comment: occasional   Drug use: Not Currently    Types: Marijuana, Cocaine    Comment:  couple of months, no cocaine, 08/08/20 marijuana every now and then   Sexual activity: Not on file  Other Topics Concern   Not on file  Social History Narrative   Lives alone   No caffeine   Social Determinants of Health   Financial Resource Strain: Not on file  Food Insecurity: Not on file  Transportation Needs: Not on file  Physical Activity: Not on file  Stress: Not on file  Social Connections: Not on file  Intimate Partner Violence: Not on file    Outpatient Medications Prior to Visit  Medication Sig Dispense Refill   aspirin 81 MG EC tablet Take 1 tablet (81 mg total) by mouth daily. Swallow whole. 30 tablet 12   UNABLE TO FIND Mechanical Lift Chair Dx: Z86.73 1 each 0   UNABLE TO FIND Transfer Bench/Bath Chair Dx: T24.58 1 each 0   amLODipine (NORVASC) 10 MG tablet Take 1 tablet (10 mg total) by mouth daily. 30 tablet 1   atorvastatin (LIPITOR) 20 MG tablet TAKE 1 Tablet BY MOUTH ONCE EVERY DAY 30 tablet 1   lisinopril (ZESTRIL) 20 MG tablet TAKE 1 Tablet BY MOUTH ONCE EVERY DAY 30 tablet 1   metFORMIN (GLUCOPHAGE) 500 MG tablet TAKE 1 Tablet  BY MOUTH TWICE  DAILY WITH A MEAL 60 tablet 1   metoprolol tartrate (LOPRESSOR) 50 MG tablet Take 1 tablet (50 mg total) by mouth 2 (two) times daily. 60 tablet 1   Vitamin D, Ergocalciferol, (DRISDOL) 1.25 MG (50000 UNIT) CAPS capsule Take 1 capsule (50,000 Units total) by mouth every 7 (seven) days. 5 capsule 5   No facility-administered medications prior to visit.    Allergies  Allergen Reactions   Bee Venom Swelling    Localized     ROS Review of Systems  Constitutional:  Negative for chills and fever.  HENT:  Negative for congestion and sore throat.   Eyes:  Negative for pain and discharge.  Respiratory:  Negative for cough and shortness of breath.   Cardiovascular:  Negative for chest pain and palpitations.  Gastrointestinal:  Negative for constipation, diarrhea, nausea and vomiting.  Endocrine: Negative for polydipsia  and polyuria.  Genitourinary:  Negative for dysuria and hematuria.  Musculoskeletal:  Positive for arthralgias. Negative for neck pain and neck stiffness.  Skin:  Negative for rash.  Neurological:  Positive for weakness and numbness (LE, intermittent). Negative for dizziness and headaches.  Psychiatric/Behavioral:  Negative for agitation and behavioral problems.      Objective:    Physical Exam Vitals reviewed.  Constitutional:      General: He is not in acute distress.    Appearance: He is obese. He is not diaphoretic.  HENT:     Head: Normocephalic and atraumatic.     Nose: Nose normal.     Mouth/Throat:     Mouth: Mucous membranes are moist.  Eyes:     General: No scleral icterus.    Extraocular Movements: Extraocular movements intact.  Cardiovascular:     Rate and Rhythm: Normal rate and regular rhythm.     Pulses: Normal pulses.     Heart sounds: Normal heart sounds. No murmur heard. Pulmonary:     Breath sounds: Normal breath sounds. No wheezing or rales.  Abdominal:     Palpations: Abdomen is soft.     Tenderness: no abdominal tenderness  Musculoskeletal:     Cervical back: Neck supple. No tenderness.     Right lower leg: No edema.     Left lower leg: No edema.  Skin:    General: Skin is warm.     Findings: No rash.  Neurological:     General: No focal deficit present.     Mental Status: He is alert and oriented to person, place, and time.     Sensory: No sensory deficit.     Motor: Weakness (b/l LE) present.  Psychiatric:        Mood and Affect: Mood normal.        Behavior: Behavior normal.    BP 140/88 (BP Location: Right Arm, Patient Position: Sitting, Cuff Size: Large)   Pulse 66   Temp 98.3 F (36.8 C)   Resp 18   Ht 6' (1.829 m)   Wt (!) 306 lb (138.8 kg)   SpO2 96%   BMI 41.50 kg/m  Wt Readings from Last 3 Encounters:  07/23/21 (!) 306 lb (138.8 kg)  04/22/21 (!) 308 lb 1.9 oz (139.8 kg)  03/11/21 (!) 313 lb (142 kg)     Health  Maintenance Due  Topic Date Due   PNEUMOCOCCAL POLYSACCHARIDE VACCINE AGE 59-64 HIGH RISK  Never done   Pneumococcal Vaccine 13-57 Years old (1 - PCV) Never done   HIV Screening  Never done   Hepatitis C  Screening  Never done   COLONOSCOPY (Pts 45-87yr Insurance coverage will need to be confirmed)  Never done   Zoster Vaccines- Shingrix (1 of 2) Never done   INFLUENZA VACCINE  Never done    There are no preventive care reminders to display for this patient.  Lab Results  Component Value Date   TSH 0.797 03/11/2021   Lab Results  Component Value Date   WBC 9.4 03/11/2021   HGB 13.8 03/11/2021   HCT 44.4 03/11/2021   MCV 78 (L) 03/11/2021   PLT 254 03/11/2021   Lab Results  Component Value Date   NA 143 03/11/2021   K 5.1 03/11/2021   CO2 19 (L) 03/11/2021   GLUCOSE 85 03/11/2021   BUN 14 03/11/2021   CREATININE 1.21 03/11/2021   BILITOT 0.3 03/11/2021   ALKPHOS 108 03/11/2021   AST 14 03/11/2021   ALT 10 03/11/2021   PROT 7.3 03/11/2021   ALBUMIN 4.7 03/11/2021   CALCIUM 10.0 03/11/2021   ANIONGAP 6 12/14/2020   EGFR 69 03/11/2021   Lab Results  Component Value Date   CHOL 155 03/11/2021   Lab Results  Component Value Date   HDL 56 03/11/2021   Lab Results  Component Value Date   LDLCALC 86 03/11/2021   Lab Results  Component Value Date   TRIG 66 03/11/2021   Lab Results  Component Value Date   CHOLHDL 2.8 03/11/2021   Lab Results  Component Value Date   HGBA1C 5.8 (H) 03/11/2021      Assessment & Plan:   Problem List Items Addressed This Visit       Cardiovascular and Mediastinum   Essential hypertension - Primary    BP Readings from Last 1 Encounters:  07/23/21 140/88  Well-controlled with Amlodipine, Lisinopril and Metoprolol Counseled for compliance with the medications Advised DASH diet and moderate exercise/walking, at least 150 mins/week       Relevant Medications   atorvastatin (LIPITOR) 20 MG tablet   amLODipine (NORVASC) 10  MG tablet   lisinopril (ZESTRIL) 20 MG tablet   metoprolol tartrate (LOPRESSOR) 50 MG tablet     Endocrine   Diabetes mellitus without complication (HCC)    Lab Results  Component Value Date   HGBA1C 5.8 (H) 03/11/2021  Well-controlled with Metformin On statin and ACEi Check CMP and HbA1C Advised to follow diabetic diet Diabetic eye exam: Advised to follow up with Ophthalmology for diabetic eye exam      Relevant Medications   atorvastatin (LIPITOR) 20 MG tablet   lisinopril (ZESTRIL) 20 MG tablet   metFORMIN (GLUCOPHAGE) 500 MG tablet     Other   Hyperlipidemia    On statin Check lipid profile      Relevant Medications   atorvastatin (LIPITOR) 20 MG tablet   amLODipine (NORVASC) 10 MG tablet   lisinopril (ZESTRIL) 20 MG tablet   metoprolol tartrate (LOPRESSOR) 50 MG tablet   Tobacco use disorder    Smokes about 3 cigarettes/day  Asked about quitting: confirms that he currently smokes cigarettes Advise to quit smoking: Educated about QUITTING to reduce the risk of cancer, cardio and cerebrovascular disease. Assess willingness: Unwilling to quit at this time, but is working on cutting back. Assist with counseling and pharmacotherapy: Counseled for 5 minutes and literature provided. Arrange for follow up: Follow up in 3 months and continue to offer help.      Spinal stenosis of lumbar region    Has had Spine surgery evaluation Did not opt  for decompression surgery Tylenol/Ibuprofen PRN      History of CVA (cerebrovascular accident)    On Aspirin and statin Follows up with Neurology      Vitamin D deficiency    Last vitamin D Lab Results  Component Value Date   VD25OH 12.2 (L) 03/11/2021  Needs to start Vitamin D 50,000 IU every week      Relevant Medications   Vitamin D, Ergocalciferol, (DRISDOL) 1.25 MG (50000 UNIT) CAPS capsule    Meds ordered this encounter  Medications   Vitamin D, Ergocalciferol, (DRISDOL) 1.25 MG (50000 UNIT) CAPS capsule     Sig: Take 1 capsule (50,000 Units total) by mouth every 7 (seven) days.    Dispense:  5 capsule    Refill:  5   atorvastatin (LIPITOR) 20 MG tablet    Sig: TAKE 1 Tablet BY MOUTH ONCE EVERY DAY    Dispense:  90 tablet    Refill:  1   amLODipine (NORVASC) 10 MG tablet    Sig: Take 1 tablet (10 mg total) by mouth daily.    Dispense:  30 tablet    Refill:  2   lisinopril (ZESTRIL) 20 MG tablet    Sig: TAKE 1 Tablet BY MOUTH ONCE EVERY DAY    Dispense:  30 tablet    Refill:  2   metFORMIN (GLUCOPHAGE) 500 MG tablet    Sig: TAKE 1 Tablet  BY MOUTH TWICE DAILY WITH A MEAL    Dispense:  60 tablet    Refill:  2   metoprolol tartrate (LOPRESSOR) 50 MG tablet    Sig: Take 1 tablet (50 mg total) by mouth 2 (two) times daily.    Dispense:  60 tablet    Refill:  2    Follow-up: Return in about 4 months (around 11/22/2021) for Annual physical.    Lindell Spar, MD

## 2021-07-23 NOTE — Assessment & Plan Note (Signed)
Has had Spine surgery evaluation Did not opt for decompression surgery Tylenol/Ibuprofen PRN 

## 2021-07-23 NOTE — Assessment & Plan Note (Signed)
Lab Results  °Component Value Date  ° HGBA1C 5.8 (H) 03/11/2021  ° °Well-controlled with Metformin °On statin and ACEi °Check CMP and HbA1C °Advised to follow diabetic diet °Diabetic eye exam: Advised to follow up with Ophthalmology for diabetic eye exam °

## 2021-07-23 NOTE — Assessment & Plan Note (Signed)
Last vitamin D Lab Results  Component Value Date   VD25OH 12.2 (L) 03/11/2021   Needs to start Vitamin D 50,000 IU every week

## 2021-07-23 NOTE — Assessment & Plan Note (Signed)
Smokes about 3 cigarettes/day  Asked about quitting: confirms that he currently smokes cigarettes Advise to quit smoking: Educated about QUITTING to reduce the risk of cancer, cardio and cerebrovascular disease. Assess willingness: Unwilling to quit at this time, but is working on cutting back. Assist with counseling and pharmacotherapy: Counseled for 5 minutes and literature provided. Arrange for follow up: Follow up in 3 months and continue to offer help.

## 2021-07-23 NOTE — Assessment & Plan Note (Signed)
On statin Check lipid profile 

## 2021-08-01 ENCOUNTER — Ambulatory Visit (INDEPENDENT_AMBULATORY_CARE_PROVIDER_SITE_OTHER): Payer: Medicaid Other | Admitting: Clinical

## 2021-08-01 ENCOUNTER — Other Ambulatory Visit: Payer: Self-pay

## 2021-08-01 DIAGNOSIS — F4310 Post-traumatic stress disorder, unspecified: Secondary | ICD-10-CM | POA: Diagnosis not present

## 2021-08-01 NOTE — Progress Notes (Signed)
Virtual Visit via Telephone Note   I connected with Isaiah Ellis on 08/01/21 at  2:00 PM EDT by telephone and verified that I am speaking with the correct person using two identifiers.   Location: Patient: Home Provider: Office   I discussed the limitations, risks, security and privacy concerns of performing an evaluation and management service by telephone and the availability of in person appointments. I also discussed with the patient that there may be a patient responsible charge related to this service. The patient expressed understanding and agreed to proceed.   THERAPIST PROGRESS NOTE   Session Time: 2:00PM-2:55PM   Participation Level: Active   Behavioral Response: CasualAlertIrritable   Type of Therapy: Individual Therapy   Treatment Goals addressed: Coping Anger Management   Interventions: CBT, Motivational Interviewing, Solution Focused and Supportive   Summary: Isaiah Ellis is a 60 y.o. male who presents with PTSD. The OPT therapist worked with the patient for his ongoing scheduled session. The OPT therapist utilized Motivational Interviewing to assist in creating therapeutic repore. The patient reviewed his symptoms, triggers, and behaviors over the past few weeks. The patient in this session spoke about his recent struggle in trying to make including dieting , being more active, and not using substance, as well as conceptualizing what he has been through and not be "bitter". The patient spoke about struggling with frustration. The OPT therapist utilized Cognitive Behavioral Therapy through cognitive restructuring as well as worked with the patient on coping strategies to assist in management of PTSD. The OPT therapist worked with the patient reviewing positives as well as frustration from his ongoing recovery from having a stroke.    Suicidal/Homicidal: Nowithout intent/plan   Therapist Response: The OPT therapist worked with the patient for the patients scheduled  session. The patient was engaged in his session and gave feedback in relation to triggers, symptoms, and behavior responses over the past few weeks. The OPT therapist worked with the patient utilizing an in session Cognitive Behavioral Therapy exercise. The patient was responsive in the session and verbalized,". I am not homeless, my blood pressure is down, and I have a nurse aid who is coming in to help me. I have my apartment and others admire how I keep me home". The patient spoke about his realization of the impact of his trauma and negative coping on his health as well as desire for change and working to have a positive mentality and lens. The The OPT therapist will continue treatment work with the patient in his next scheduled session.     Plan: Return again in 2/3 weeks.   Diagnosis:      Axis I: PTSD                         Axis II: No diagnosis   I discussed the assessment and treatment plan with the patient. The patient was provided an opportunity to ask questions and all were answered. The patient agreed with the plan and demonstrated an understanding of the instructions.   The patient was advised to call back or seek an in-person evaluation if the symptoms worsen or if the condition fails to improve as anticipated.   I provided 55 minutes of non-face-to-face time during this encounter.   Winfred Burn, LCSW   08/01/2021

## 2021-08-26 ENCOUNTER — Other Ambulatory Visit: Payer: Self-pay

## 2021-08-26 ENCOUNTER — Telehealth: Payer: Self-pay

## 2021-08-26 DIAGNOSIS — I10 Essential (primary) hypertension: Secondary | ICD-10-CM

## 2021-08-26 DIAGNOSIS — E559 Vitamin D deficiency, unspecified: Secondary | ICD-10-CM

## 2021-08-26 MED ORDER — LISINOPRIL 20 MG PO TABS: ORAL_TABLET | ORAL | 2 refills | Status: DC

## 2021-08-26 MED ORDER — VITAMIN D (ERGOCALCIFEROL) 1.25 MG (50000 UNIT) PO CAPS: 50000.0000 [IU] | ORAL_CAPSULE | ORAL | 5 refills | Status: DC

## 2021-08-26 NOTE — Telephone Encounter (Signed)
Patient called need med refills and needs to be sent into [facility] for delivery, can not drive now needs a pharmacy that can deliver to him.  Vitamin D, Ergocalciferol, (DRISDOL) 1.25 MG (50000 UNIT) CAPS capsule    lisinopril (ZESTRIL) 20 MG tablet  All future prescriptions needs to send in [place].

## 2021-08-26 NOTE — Telephone Encounter (Signed)
Sent refills to [facility]

## 2021-08-27 ENCOUNTER — Other Ambulatory Visit: Payer: Self-pay

## 2021-08-27 ENCOUNTER — Ambulatory Visit (INDEPENDENT_AMBULATORY_CARE_PROVIDER_SITE_OTHER): Payer: Medicaid Other | Admitting: Clinical

## 2021-08-27 DIAGNOSIS — F4310 Post-traumatic stress disorder, unspecified: Secondary | ICD-10-CM

## 2021-08-27 NOTE — Progress Notes (Signed)
Virtual Visit via Telephone Note   I connected with Isaiah Ellis on 08/27/21 at  1:00 PM EDT by telephone and verified that I am speaking with the correct person using two identifiers.   Location: Patient: Home Provider: Office   I discussed the limitations, risks, security and privacy concerns of performing an evaluation and management service by telephone and the availability of in person appointments. I also discussed with the patient that there may be a patient responsible charge related to this service. The patient expressed understanding and agreed to proceed.   THERAPIST PROGRESS NOTE   Session Time: 1:00PM-1:45PM   Participation Level: Active   Behavioral Response: CasualAlertIrritable   Type of Therapy: Individual Therapy   Treatment Goals addressed: Coping Anger Management   Interventions: CBT, Motivational Interviewing, Solution Focused and Supportive   Summary: Isaiah Ellis is a 60 y.o. male who presents with PTSD. The OPT therapist worked with the patient for his ongoing scheduled session. The OPT therapist utilized Motivational Interviewing to assist in creating therapeutic repore. The patient reviewed his symptoms, triggers, and behaviors over the past few weeks. The patient in this session spoke about his ongoing work to recover from a stroke and his work in changing his life and lifestyle. The patient spoke about struggling with frustration with his disability and managing/ budgeting his finances. The OPT therapist utilized Cognitive Behavioral Therapy through cognitive restructuring as well as worked with the patient on coping strategies to assist in management of PTSD.     Suicidal/Homicidal: Nowithout intent/plan   Therapist Response: The OPT therapist worked with the patient for the patients scheduled session. The patient was engaged in his session and gave feedback in relation to triggers, symptoms, and behavior responses over the past few weeks. The OPT  therapist worked with the patient utilizing an in session Cognitive Behavioral Therapy exercise. The patient was responsive in the session and verbalized,". I hear now and I see the stroke as a blessing for me to change life, but its hard I was dancing with the devil". The patient spoke about his realization of the impact of his trauma and negative coping on his health as well as desire for change and working to have a positive mentality and lens. The patient continues to work on recovering with his speech and not stuttering.  The patient noted, " I am learning now just how to be nice to people". The The OPT therapist will continue treatment work with the patient in his next scheduled session.     Plan: Return again in 2/3 weeks.   Diagnosis:      Axis I: PTSD                         Axis II: No diagnosis   I discussed the assessment and treatment plan with the patient. The patient was provided an opportunity to ask questions and all were answered. The patient agreed with the plan and demonstrated an understanding of the instructions.   The patient was advised to call back or seek an in-person evaluation if the symptoms worsen or if the condition fails to improve as anticipated.   I provided 45 minutes of non-face-to-face time during this encounter.   Winfred Burn, LCSW   08/27/2021

## 2021-09-11 IMAGING — MR MR HEAD W/O CM
15 series · 41 of 48 positions shown · non-contrast
Comparison: 02/28/2019 head CT.

CLINICAL DATA: New balance problems.

EXAM:
MRI HEAD WITHOUT CONTRAST
TECHNIQUE: Multiplanar, multiecho pulse sequences of the brain and surrounding
structures were obtained without intravenous contrast.

[Series 5: DWI · axial · 4.0mm · 0.90mm/px · z∈[-52,+79]mm · 3 of 34 slices shown (1 of 6)]
[im 1/34]
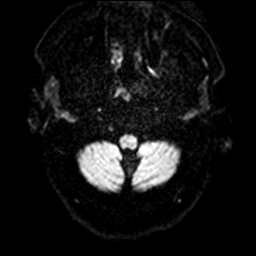
[im 17/34]
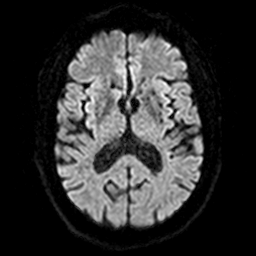
[im 34/34]
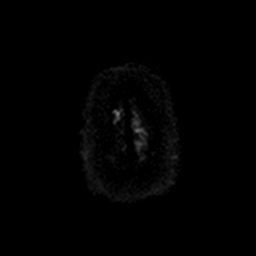

[Series 5: DWI · axial · 4.0mm · 0.90mm/px · z∈[-52,+79]mm · 2 of 34 slices shown (2 of 6)]
[im 1/34]
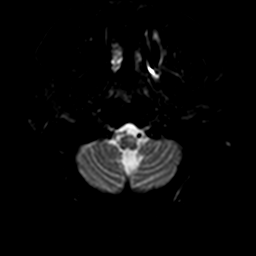
[im 34/34]
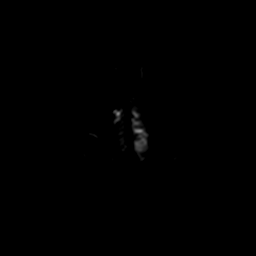

[Series 6: DWI · axial · 4.0mm · 0.90mm/px · z∈[-52,+79]mm · 2 of 34 slices shown (3 of 6)]
[im 1/34]
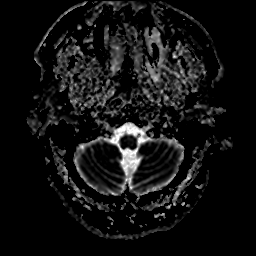
[im 34/34]
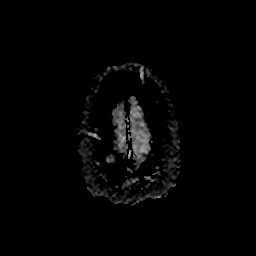

[Series 7: DWI · coronal · 4.0mm · 0.90mm/px · 2 of 36 slices shown (4 of 6)]
[im 1/36]
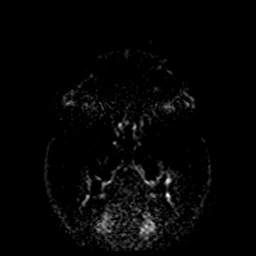
[im 36/36]
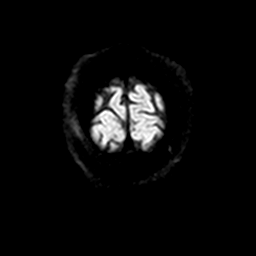

[Series 7: DWI · coronal · 4.0mm · 0.90mm/px · 2 of 36 slices shown (5 of 6)]
[im 1/36]
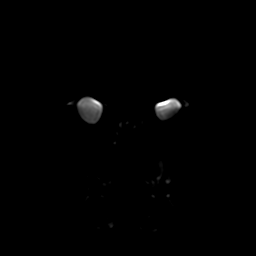
[im 36/36]
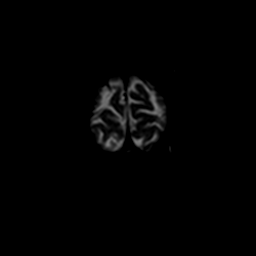

[Series 8: DWI · coronal · 4.0mm · 0.90mm/px · 2 of 36 slices shown (6 of 6)]
[im 1/36]
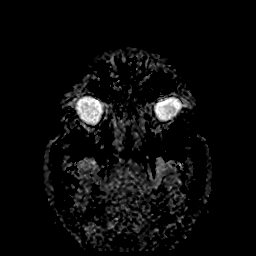
[im 36/36]
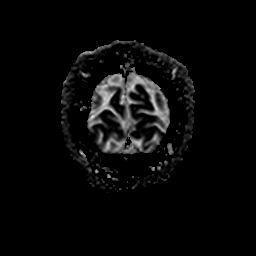

[Series 9: GRE · sagittal · 5.0mm · 0.90mm/px · 2 of 25 slices shown (1 of 2)]
[im 1/25]
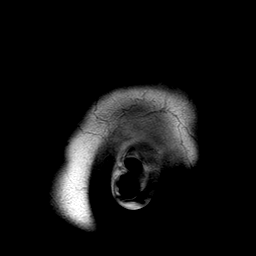
[im 25/25]
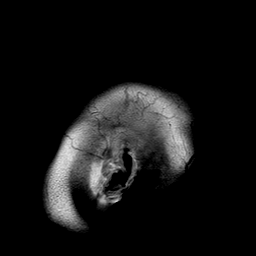

[Series 10: T2 · axial · 5.0mm · 0.43mm/px · z∈[-73,+83]mm · 2 of 25 slices shown]
[im 1/25]
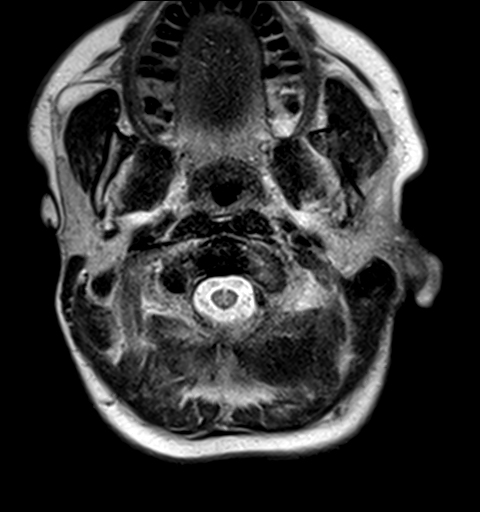
[im 25/25]
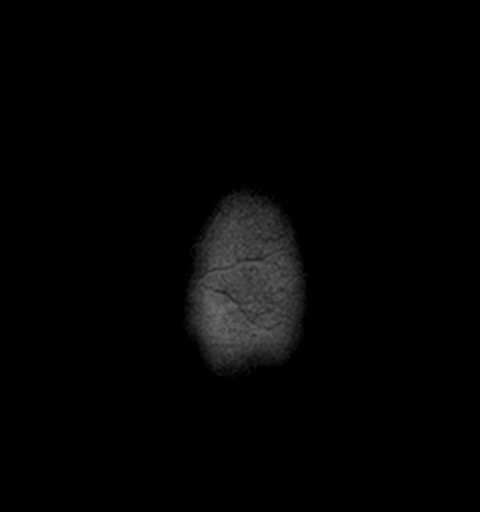

[Series 11: FLAIR · axial · 5.0mm · 0.43mm/px · z∈[-73,+82]mm · 2 of 25 slices shown]
[im 1/25]
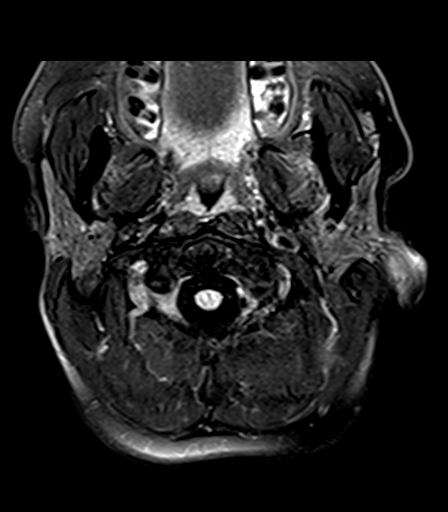
[im 25/25]
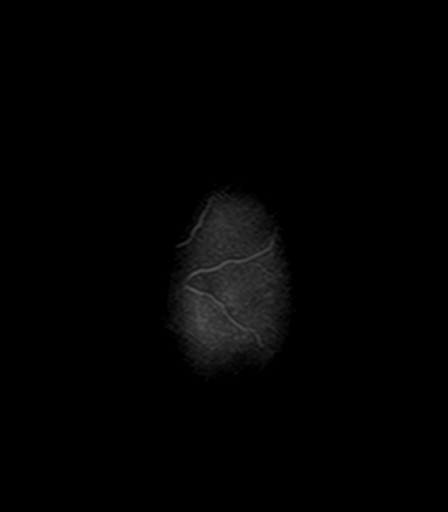

[Series 14: GRE · axial · 5.0mm · 0.43mm/px · z∈[-73,+82]mm · 2 of 25 slices shown (2 of 2)]
[im 1/25]
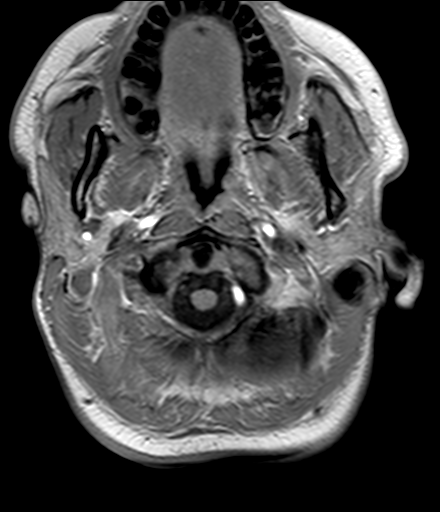
[im 25/25]
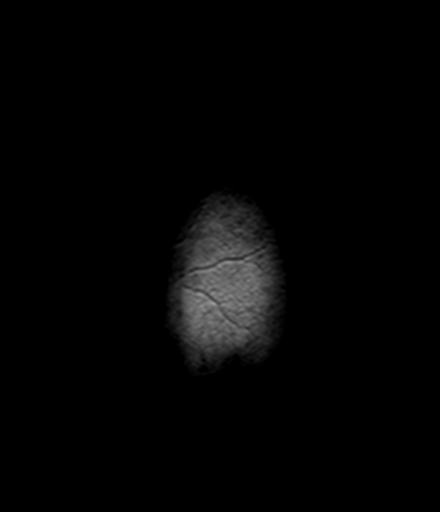

[Series 15: mag_images · axial · 3.0mm · 0.90mm/px · z∈[-69,+107]mm · 4 of 60 slices shown]
[im 1/60]
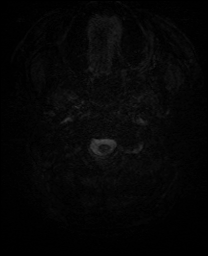
[im 20/60]
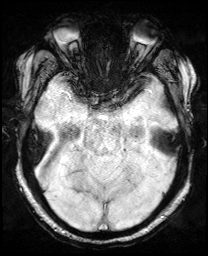
[im 40/60]
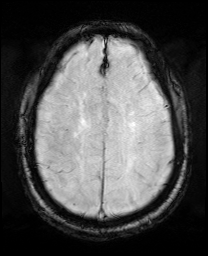
[im 60/60]
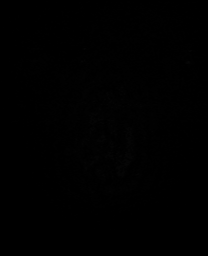

[Series 16: pha_images · axial · 3.0mm · 0.90mm/px · z∈[-69,+107]mm · 4 of 60 slices shown]
[im 1/60]
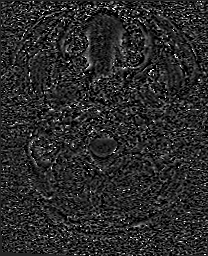
[im 20/60]
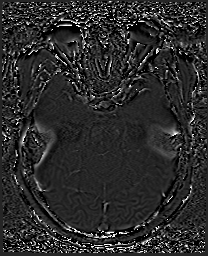
[im 40/60]
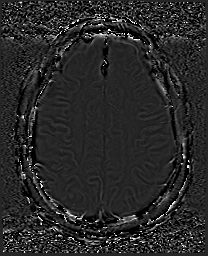
[im 60/60]
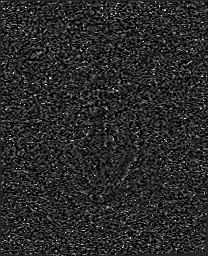

[Series 17: swi_images · axial · 3.0mm · 0.90mm/px · z∈[-69,+107]mm · 4 of 60 slices shown]
[im 1/60]
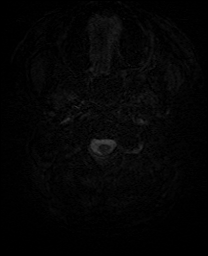
[im 20/60]
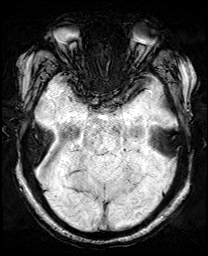
[im 40/60]
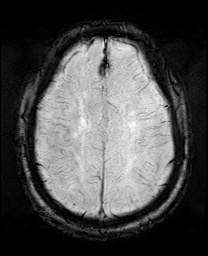
[im 60/60]
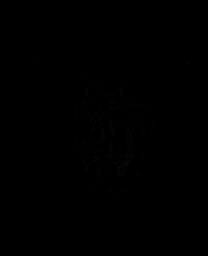

[Series 18: mip_images(sw) · axial · 24.0mm · 0.90mm/px · z∈[-58,+96]mm · 3 of 53 slices shown]
[im 1/53]
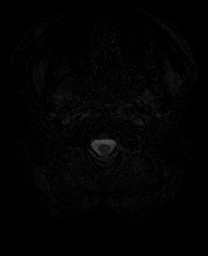
[im 27/53]
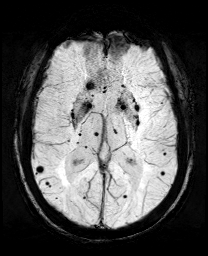
[im 53/53]
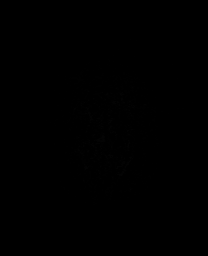

[Series 19: t2_space_dark-fluid_sag_p2_ns-ir · sagittal · 1.0mm · 0.49mm/px · 5 of 190 slices shown]
[im 1/190]
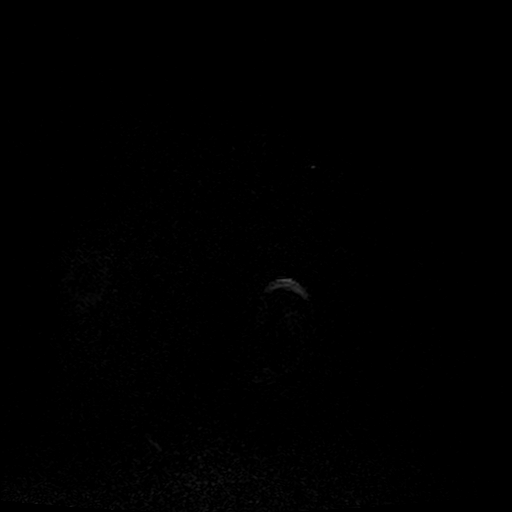
[im 35/190]
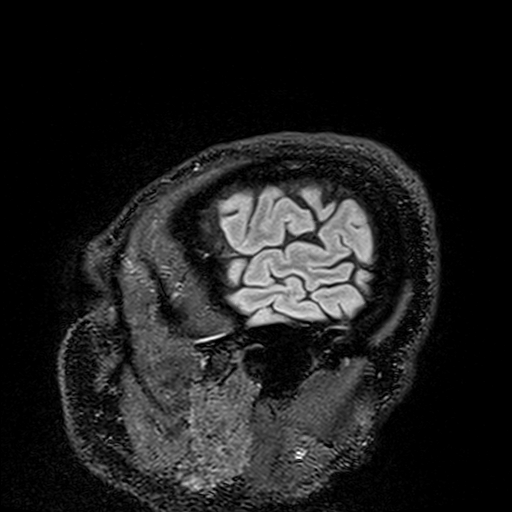
[im 52/190]
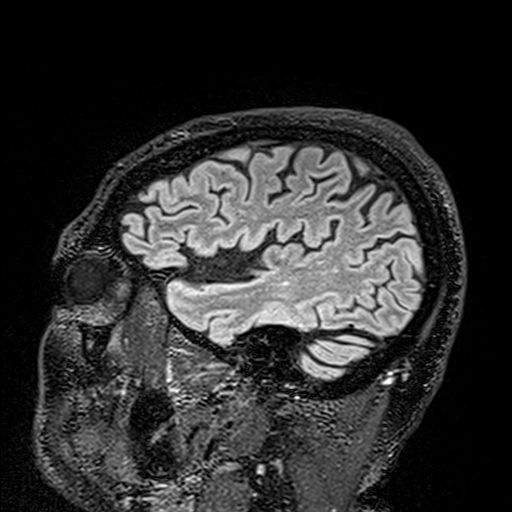
[im 86/190]
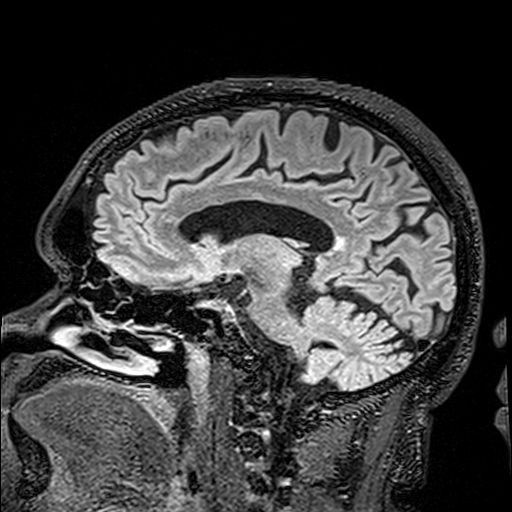
[im 104/190]
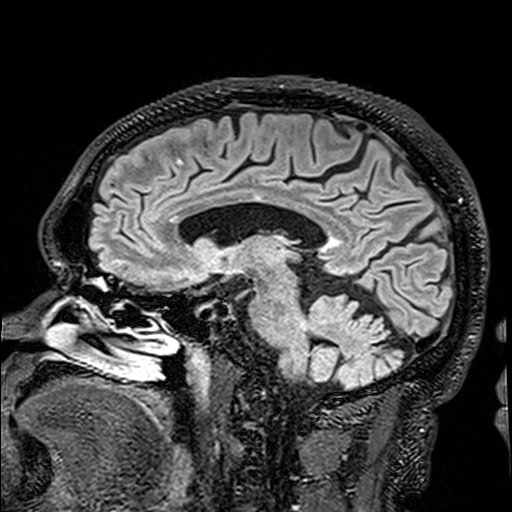

[41 of 48 positions shown; findings below may reference images not displayed]

FINDINGS: Brain: Mild diffuse parenchymal volume loss with ex vacuo
dilatation. Scattered and confluent T2/FLAIR hyperintense
periventricular and deep white matter foci.

Non expansile T2/FLAIR hyperintense signal involving the anterior
left medulla ([DATE]). Prominent supratentorial perivascular spaces.
Remote tiny left basal ganglia and right pontine lacunar infarcts.

No diffusion-weighted signal abnormality to suggest acute infarct.
Multifocal SWI signal dropout involving the bilateral cerebral and
cerebellar hemispheres with prominent involvement of the basal
ganglia and thalami. No midline shift, mass lesion or extra-axial
fluid collection.

Vascular: Normal flow voids.

Skull and upper cervical spine: Normal marrow signal.

Sinuses/Orbits: Normal orbits. Mild ethmoid and left maxillary sinus
mucosal thickening. No mastoid effusion.

Other: None.
IMPRESSION: No acute intracranial process. Multifocal cerebral and cerebellar
microhemorrhages predominantly involving the basal ganglia are
suspicious for chronic hypertensive encephalopathy.

Nonexpansile anterior left medulla T2/FLAIR hyperintense signal is
nonspecific. Differential includes sequela of prior insult,
capillary telangiectasia and demyelination. Consider postcontrast
MRI imaging for further evaluation.

Mild cerebral atrophy. Moderate chronic microvascular ischemic
changes.

## 2021-09-17 ENCOUNTER — Ambulatory Visit (INDEPENDENT_AMBULATORY_CARE_PROVIDER_SITE_OTHER): Payer: Medicaid Other | Admitting: Clinical

## 2021-09-17 ENCOUNTER — Other Ambulatory Visit: Payer: Self-pay

## 2021-09-17 DIAGNOSIS — F4310 Post-traumatic stress disorder, unspecified: Secondary | ICD-10-CM | POA: Diagnosis not present
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
## 2021-10-02 ENCOUNTER — Other Ambulatory Visit: Payer: Self-pay | Admitting: Internal Medicine

## 2021-10-02 ENCOUNTER — Other Ambulatory Visit: Payer: Self-pay | Admitting: Physician Assistant

## 2021-10-02 DIAGNOSIS — I10 Essential (primary) hypertension: Secondary | ICD-10-CM

## 2021-10-03 ENCOUNTER — Telehealth: Payer: Self-pay | Admitting: Internal Medicine

## 2021-10-03 ENCOUNTER — Other Ambulatory Visit: Payer: Self-pay | Admitting: *Deleted

## 2021-10-03 DIAGNOSIS — I10 Essential (primary) hypertension: Secondary | ICD-10-CM

## 2021-10-03 MED ORDER — AMLODIPINE BESYLATE 10 MG PO TABS
10.0000 mg | ORAL_TABLET | Freq: Every day | ORAL | 2 refills | Status: DC
Start: 2021-10-03 — End: 2021-10-03

## 2021-10-03 MED ORDER — AMLODIPINE BESYLATE 10 MG PO TABS
10.0000 mg | ORAL_TABLET | Freq: Every day | ORAL | 2 refills | Status: DC
Start: 2021-10-03 — End: 2022-01-15

## 2021-10-03 NOTE — Telephone Encounter (Signed)
Pt medication sent to pharmacy  

## 2021-10-03 NOTE — Telephone Encounter (Signed)
Pt called in for refills on   amLODipine (NORVASC) 10 MG tablet   Pt states that [facility] [employer] has faxed over refill request

## 2021-10-07 ENCOUNTER — Telehealth: Payer: Self-pay

## 2021-10-07 NOTE — Telephone Encounter (Signed)
Patient called need med refill  on Amlodipine 10 mg Pharmacy:  [facility]

## 2021-10-07 NOTE — Telephone Encounter (Signed)
This medication was sent 10-03-21

## 2021-10-08 ENCOUNTER — Ambulatory Visit (HOSPITAL_COMMUNITY): Payer: Medicaid Other | Admitting: Clinical

## 2021-10-08 ENCOUNTER — Other Ambulatory Visit: Payer: Self-pay

## 2021-10-08 ENCOUNTER — Telehealth (HOSPITAL_COMMUNITY): Payer: Self-pay | Admitting: Clinical

## 2021-10-08 NOTE — Telephone Encounter (Signed)
Pt was at the grocery store they said they would call in to reschedule this missed appointment.

## 2021-10-14 ENCOUNTER — Other Ambulatory Visit: Payer: Self-pay

## 2021-10-14 ENCOUNTER — Ambulatory Visit (INDEPENDENT_AMBULATORY_CARE_PROVIDER_SITE_OTHER): Payer: Medicaid Other | Admitting: Clinical

## 2021-10-14 DIAGNOSIS — F4310 Post-traumatic stress disorder, unspecified: Secondary | ICD-10-CM | POA: Diagnosis not present
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
## 2021-10-31 ENCOUNTER — Other Ambulatory Visit: Payer: Self-pay | Admitting: Internal Medicine

## 2021-10-31 DIAGNOSIS — I10 Essential (primary) hypertension: Secondary | ICD-10-CM

## 2021-11-12 ENCOUNTER — Telehealth: Payer: Self-pay

## 2021-11-12 ENCOUNTER — Other Ambulatory Visit: Payer: Self-pay | Admitting: *Deleted

## 2021-11-12 DIAGNOSIS — E785 Hyperlipidemia, unspecified: Secondary | ICD-10-CM

## 2021-11-12 MED ORDER — ATORVASTATIN CALCIUM 20 MG PO TABS: ORAL_TABLET | ORAL | 1 refills | Status: DC

## 2021-11-12 NOTE — Telephone Encounter (Signed)
Patient called and ask for [place] to send all his meds and orders to [facility].  He said he is out of Atorvastino and asked it that could please be called today.  I told him I was not sure we could get it today but I would go ahead and send it back to the nurse.  Thanks

## 2021-11-12 NOTE — Telephone Encounter (Signed)
Pt atorvastatin sent to [facility] changed

## 2021-11-13 ENCOUNTER — Ambulatory Visit (INDEPENDENT_AMBULATORY_CARE_PROVIDER_SITE_OTHER): Payer: Medicaid Other | Admitting: Clinical

## 2021-11-13 ENCOUNTER — Other Ambulatory Visit: Payer: Self-pay

## 2021-11-13 DIAGNOSIS — F4310 Post-traumatic stress disorder, unspecified: Secondary | ICD-10-CM
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
## 2021-11-21 ENCOUNTER — Other Ambulatory Visit: Payer: Self-pay

## 2021-11-21 ENCOUNTER — Ambulatory Visit (INDEPENDENT_AMBULATORY_CARE_PROVIDER_SITE_OTHER): Payer: Medicare HMO | Admitting: Internal Medicine

## 2021-11-21 ENCOUNTER — Encounter: Payer: Self-pay | Admitting: Internal Medicine

## 2021-11-21 VITALS — BP 115/80 | HR 69 | Temp 98.3°F | Ht 72.05 in | Wt 298.1 lb

## 2021-11-21 DIAGNOSIS — Z0001 Encounter for general adult medical examination with abnormal findings: Secondary | ICD-10-CM | POA: Diagnosis not present

## 2021-11-21 DIAGNOSIS — Z125 Encounter for screening for malignant neoplasm of prostate: Secondary | ICD-10-CM | POA: Diagnosis not present

## 2021-11-21 DIAGNOSIS — Z23 Encounter for immunization: Secondary | ICD-10-CM

## 2021-11-21 DIAGNOSIS — E559 Vitamin D deficiency, unspecified: Secondary | ICD-10-CM | POA: Diagnosis not present

## 2021-11-21 DIAGNOSIS — Z8673 Personal history of transient ischemic attack (TIA), and cerebral infarction without residual deficits: Secondary | ICD-10-CM | POA: Diagnosis not present

## 2021-11-21 DIAGNOSIS — Z1159 Encounter for screening for other viral diseases: Secondary | ICD-10-CM | POA: Diagnosis not present

## 2021-11-21 DIAGNOSIS — E1169 Type 2 diabetes mellitus with other specified complication: Secondary | ICD-10-CM

## 2021-11-21 NOTE — Assessment & Plan Note (Signed)
Ordered PSA after discussing its limitations for prostate cancer screening, including false positive results leading additional investigations. 

## 2021-11-21 NOTE — Assessment & Plan Note (Signed)
Lab Results  Component Value Date   HGBA1C 5.8 (H) 03/11/2021   Well-controlled with Metformin On statin and ACEi Check CMP and HbA1C Advised to follow diabetic diet Diabetic eye exam: Advised to follow up with Ophthalmology for diabetic eye exam

## 2021-11-21 NOTE — Assessment & Plan Note (Addendum)
Physical exam documented. Fasting blood tests today. PCV20 in the office today. Wants to wait for Shingrix vaccine for now.

## 2021-11-21 NOTE — Patient Instructions (Signed)
Please continue taking medications as prescribed.  Please continue to follow low carb diet and ambulate as tolerated.  You were given PCV20 in the office today.

## 2021-11-21 NOTE — Progress Notes (Signed)
Established Patient Office Visit  Subjective:  [name]: Isaiah Ellis, male    DOB: [birth year]  Age: 61 y.o. MRN: [id number]  CC:  Chief Complaint  Patient presents with   Annual Exam    CPE    HPI Isaiah Ellis is a 61 y.o. male with past medical history of HTN, type II DM, CVA, HLD, lumbar spinal stenosis and tobacco abuse who presents for annual physical.  HTN: BP is well-controlled. Takes medications regularly. Patient denies headache, dizziness, chest pain, dyspnea or palpitations.   Type 2 DM: He takes metformin regularly.  Denies any polyuria or polyphagia.  He received PCV20 in the office today.  Past Medical History:  Diagnosis Date   Balance problems    Depression    Diabetes mellitus without complication ([place])    Falling    Gout    Hyperlipidemia    Hypertension    Stroke ([place]) 2005 and 02/2019    History reviewed. No pertinent surgical history.  Family History  Problem Relation Age of Onset   Hypertension Mother    Stroke Mother    Heart disease Father    Stroke Father    Hypertension Father     Social History   Socioeconomic History   Marital status: Single    Spouse name: Not on file   Number of children: 0   Years of education: Not on file   Highest education level: Associate degree: academic program  Occupational History   Not on file  Tobacco Use   Smoking status: Every Day    Packs/day: 0.25    Years: 40.00    Pack years: 10.00    Types: Cigarettes   Smokeless tobacco: Never   Tobacco comments:    2-3 cig/ day  Vaping Use   Vaping Use: Never used  Substance and Sexual Activity   Alcohol use: Not Currently    Comment: occasional   Drug use: Not Currently    Types: Marijuana, Cocaine    Comment: couple of months, no cocaine, 08/08/20 marijuana every now and then   Sexual activity: Not on file  Other Topics Concern   Not on file  Social History Narrative   Lives alone   No caffeine   Social Determinants of Health    Financial Resource Strain: Not on file  Food Insecurity: Not on file  Transportation Needs: Not on file  Physical Activity: Not on file  Stress: Not on file  Social Connections: Not on file  Intimate Partner Violence: Not on file    Outpatient Medications Prior to Visit  Medication Sig Dispense Refill   amLODipine (NORVASC) 10 MG tablet Take 1 tablet (10 mg total) by mouth daily. 30 tablet 2   aspirin 81 MG EC tablet Take 1 tablet (81 mg total) by mouth daily. Swallow whole. 30 tablet 12   atorvastatin (LIPITOR) 20 MG tablet TAKE 1 Tablet BY MOUTH ONCE EVERY DAY 90 tablet 1   lisinopril (ZESTRIL) 20 MG tablet TAKE ONE TABLET BY MOUTH ONCE DAILY. 30 tablet 3   metFORMIN (GLUCOPHAGE) 500 MG tablet TAKE 1 Tablet  BY MOUTH TWICE DAILY WITH A MEAL 60 tablet 2   metoprolol tartrate (LOPRESSOR) 50 MG tablet Take 1 tablet (50 mg total) by mouth 2 (two) times daily. 60 tablet 2   UNABLE TO FIND Mechanical Lift Chair Dx: Z86.73 1 each 0   UNABLE TO FIND Transfer Bench/Bath Chair Dx: Y70.62 1 each 0   Vitamin D, Ergocalciferol, (DRISDOL)  1.25 MG (50000 UNIT) CAPS capsule Take 1 capsule (50,000 Units total) by mouth every 7 (seven) days. 5 capsule 5   No facility-administered medications prior to visit.    Allergies  Allergen Reactions   Bee Venom Swelling    Localized     ROS Review of Systems  Constitutional:  Negative for chills and fever.  HENT:  Negative for congestion and sore throat.   Eyes:  Negative for pain and discharge.  Respiratory:  Negative for cough and shortness of breath.   Cardiovascular:  Negative for chest pain and palpitations.  Gastrointestinal:  Negative for constipation, diarrhea, nausea and vomiting.  Endocrine: Negative for polydipsia and polyuria.  Genitourinary:  Negative for dysuria and hematuria.  Musculoskeletal:  Positive for arthralgias. Negative for neck pain and neck stiffness.  Skin:  Negative for rash.  Neurological:  Positive for weakness and  numbness (LE, intermittent). Negative for dizziness and headaches.  Psychiatric/Behavioral:  Negative for agitation and behavioral problems.      Objective:    Physical Exam Vitals reviewed.  Constitutional:      General: He is not in acute distress.    Appearance: He is obese. He is not diaphoretic.  HENT:     Head: Normocephalic and atraumatic.     Nose: Nose normal.     Mouth/Throat:     Mouth: Mucous membranes are moist.  Eyes:     General: No scleral icterus.    Extraocular Movements: Extraocular movements intact.  Cardiovascular:     Rate and Rhythm: Normal rate and regular rhythm.     Pulses: Normal pulses.     Heart sounds: Normal heart sounds. No murmur heard. Pulmonary:     Breath sounds: Normal breath sounds. No wheezing or rales.  Abdominal:     Palpations: Abdomen is soft.     Tenderness: There is no abdominal tenderness.  Musculoskeletal:     Cervical back: Neck supple. No tenderness.     Right lower leg: No edema.     Left lower leg: No edema.  Skin:    General: Skin is warm.     Findings: No rash.  Neurological:     General: No focal deficit present.     Mental Status: He is alert and oriented to person, place, and time.     Sensory: No sensory deficit.     Motor: Weakness (b/l LE - muscle strength 4/5) present.     Gait: Gait abnormal (Slow, unsteady).  Psychiatric:        Mood and Affect: Mood normal.        Behavior: Behavior normal.    BP 115/80    Pulse 69    Temp 98.3 F (36.8 C) (Oral)    Ht 6' 0.05" (1.83 m)    Wt 298 lb 1.9 oz (135.2 kg)    SpO2 96%    BMI 40.38 kg/m  Wt Readings from Last 3 Encounters:  11/21/21 298 lb 1.9 oz (135.2 kg)  07/23/21 (!) 306 lb (138.8 kg)  04/22/21 (!) 308 lb 1.9 oz (139.8 kg)    Lab Results  Component Value Date   TSH 0.797 03/11/2021   Lab Results  Component Value Date   WBC 9.4 03/11/2021   HGB 13.8 03/11/2021   HCT 44.4 03/11/2021   MCV 78 (L) 03/11/2021   PLT 254 03/11/2021   Lab Results   Component Value Date   NA 143 03/11/2021   K 5.1 03/11/2021   CO2 19 (L)  03/11/2021   GLUCOSE 85 03/11/2021   BUN 14 03/11/2021   CREATININE 1.21 03/11/2021   BILITOT 0.3 03/11/2021   ALKPHOS 108 03/11/2021   AST 14 03/11/2021   ALT 10 03/11/2021   PROT 7.3 03/11/2021   ALBUMIN 4.7 03/11/2021   CALCIUM 10.0 03/11/2021   ANIONGAP 6 12/14/2020   EGFR 69 03/11/2021   Lab Results  Component Value Date   CHOL 155 03/11/2021   Lab Results  Component Value Date   HDL 56 03/11/2021   Lab Results  Component Value Date   LDLCALC 86 03/11/2021   Lab Results  Component Value Date   TRIG 66 03/11/2021   Lab Results  Component Value Date   CHOLHDL 2.8 03/11/2021   Lab Results  Component Value Date   HGBA1C 5.8 (H) 03/11/2021      Assessment & Plan:   Problem List Items Addressed This Visit       Endocrine   Type 2 diabetes mellitus with other specified complication ([place])    Lab Results  Component Value Date   HGBA1C 5.8 (H) 03/11/2021  Well-controlled with Metformin On statin and ACEi Check CMP and HbA1C Advised to follow diabetic diet Diabetic eye exam: Advised to follow up with Ophthalmology for diabetic eye exam      Relevant Orders   Hemoglobin A1c     Other   History of CVA (cerebrovascular accident)    On Aspirin and statin Used to follow up with Neurology - had right sided hemiplegia      Relevant Orders   Lipid Profile   Vitamin D deficiency   Relevant Orders   VITAMIN D 25 Hydroxy (Vit-D Deficiency, Fractures)   Encounter for general adult medical examination with abnormal findings - Primary    Physical exam documented. Fasting blood tests today. PCV20 in the office today. Wants to wait for Shingrix vaccine for now.       Relevant Orders   TSH   CMP14+EGFR   CBC with Differential/Platelet   Prostate cancer screening    Ordered PSA after discussing its limitations for prostate cancer screening, including false positive results leading  additional investigations.      Relevant Orders   PSA   Other Visit Diagnoses     Encounter for hepatitis C screening test for low risk patient       Relevant Orders   Hepatitis C Antibody   Need for pneumococcal vaccination       Relevant Orders   Pneumococcal conjugate vaccine 20-valent (Prevnar 20) (Completed)       No orders of the defined types were placed in this encounter.   Follow-up: Return in about 4 months (around 03/21/2022) for HTN and DM.    Lindell Spar, MD

## 2021-11-21 NOTE — Assessment & Plan Note (Addendum)
On Aspirin and statin Used to follow up with Neurology - had right sided hemiplegia 

## 2021-11-22 LAB — HEPATITIS C ANTIBODY: Hep C Virus Ab: <0.1 s/co ratio (ref 0.0–0.9)

## 2021-11-23 LAB — CMP14+EGFR
ALT: 8 IU/L (ref 0–44)
AST: 15 IU/L (ref 0–40)
Albumin/Globulin Ratio: 2 (ref 1.2–2.2)
Albumin: 4.6 g/dL (ref 3.8–4.9)
Alkaline Phosphatase: 101 IU/L (ref 44–121)
BUN/Creatinine Ratio: 15 (ref 10–24)
BUN: 19 mg/dL (ref 8–27)
Bilirubin Total: 0.4 mg/dL (ref 0.0–1.2)
CO2: 23 mmol/L (ref 20–29)
Calcium: 10 mg/dL (ref 8.6–10.2)
Chloride: 106 mmol/L (ref 96–106)
Creatinine, Ser: 1.31 mg/dL — ABNORMAL HIGH (ref 0.76–1.27)
Globulin, Total: 2.3 g/dL (ref 1.5–4.5)
Glucose: 81 mg/dL (ref 70–99)
Potassium: 5.1 mmol/L (ref 3.5–5.2)
Sodium: 143 mmol/L (ref 134–144)
Total Protein: 6.9 g/dL (ref 6.0–8.5)
eGFR: 62 mL/min/{1.73_m2} (ref >59)

## 2021-11-23 LAB — CBC WITH DIFFERENTIAL/PLATELET
Basophils Absolute: 0.1 10*3/uL (ref 0.0–0.2)
Basos: 1 %
EOS (ABSOLUTE): 0.5 10*3/uL — ABNORMAL HIGH (ref 0.0–0.4)
Eos: 6 %
Hematocrit: 38.4 % (ref 37.5–51.0)
Hemoglobin: 12.5 g/dL — ABNORMAL LOW (ref 13.0–17.7)
Immature Grans (Abs): 0 10*3/uL (ref 0.0–0.1)
Immature Granulocytes: 0 %
Lymphocytes Absolute: 1.5 10*3/uL (ref 0.7–3.1)
Lymphs: 19 %
MCH: 25.5 pg — ABNORMAL LOW (ref 26.6–33.0)
MCHC: 32.6 g/dL (ref 31.5–35.7)
MCV: 78 fL — ABNORMAL LOW (ref 79–97)
Monocytes Absolute: 1 10*3/uL — ABNORMAL HIGH (ref 0.1–0.9)
Monocytes: 13 %
Neutrophils Absolute: 4.9 10*3/uL (ref 1.4–7.0)
Neutrophils: 61 %
Platelets: 230 10*3/uL (ref 150–450)
RBC: 4.9 x10E6/uL (ref 4.14–5.80)
RDW: 17.1 % — ABNORMAL HIGH (ref 11.6–15.4)
WBC: 8 10*3/uL (ref 3.4–10.8)

## 2021-11-23 LAB — VITAMIN D 25 HYDROXY (VIT D DEFICIENCY, FRACTURES): Vit D, 25-Hydroxy: 38 ng/mL (ref 30.0–100.0)

## 2021-11-23 LAB — LIPID PANEL
Chol/HDL Ratio: 3 ratio (ref 0.0–5.0)
Cholesterol, Total: 176 mg/dL (ref 100–199)
HDL: 58 mg/dL (ref >39)
LDL Chol Calc (NIH): 105 mg/dL — ABNORMAL HIGH (ref 0–99)
Triglycerides: 68 mg/dL (ref 0–149)
VLDL Cholesterol Cal: 13 mg/dL (ref 5–40)

## 2021-11-23 LAB — HEMOGLOBIN A1C
Est. average glucose Bld gHb Est-mCnc: 120 mg/dL
Hgb A1c MFr Bld: 5.8 % — ABNORMAL HIGH (ref 4.8–5.6)

## 2021-11-23 LAB — PSA: Prostate Specific Ag, Serum: 1.4 ng/mL (ref 0.0–4.0)

## 2021-11-23 LAB — TSH: TSH: 0.45 u[IU]/mL (ref 0.450–4.500)

## 2021-11-25 ENCOUNTER — Other Ambulatory Visit: Payer: Self-pay | Admitting: Internal Medicine

## 2021-11-25 DIAGNOSIS — E785 Hyperlipidemia, unspecified: Secondary | ICD-10-CM

## 2021-11-25 MED ORDER — ATORVASTATIN CALCIUM 40 MG PO TABS: 40.0000 mg | ORAL_TABLET | Freq: Every day | ORAL | 1 refills | Status: DC

## 2021-11-29 ENCOUNTER — Telehealth: Payer: Self-pay

## 2021-11-29 ENCOUNTER — Other Ambulatory Visit: Payer: Self-pay

## 2021-11-29 DIAGNOSIS — E785 Hyperlipidemia, unspecified: Secondary | ICD-10-CM

## 2021-11-29 MED ORDER — ATORVASTATIN CALCIUM 40 MG PO TABS: 40.0000 mg | ORAL_TABLET | Freq: Every day | ORAL | 1 refills | Status: DC

## 2021-11-29 NOTE — Telephone Encounter (Signed)
Patient need lipitor  40 mg refill    [facility]

## 2021-12-02 NOTE — Congregational Nurse Program (Signed)
Pt requested assistance with receiving technical assistance with resetting his BP and Scale equipment to be re-connected to his Monitoring APP on his phone.  Pt was sucessfully reconnected on today in order to continue to BP and weight maintenance.

## 2021-12-11 ENCOUNTER — Ambulatory Visit (HOSPITAL_COMMUNITY): Payer: Medicaid Other | Admitting: Clinical

## 2021-12-11 ENCOUNTER — Telehealth (HOSPITAL_COMMUNITY): Payer: Self-pay | Admitting: Clinical

## 2021-12-11 ENCOUNTER — Other Ambulatory Visit: Payer: Self-pay

## 2021-12-11 NOTE — Telephone Encounter (Signed)
The patient did not respond to contact attempts and no vm box was set up for the contact number.

## 2021-12-24 ENCOUNTER — Other Ambulatory Visit: Payer: Self-pay

## 2021-12-24 ENCOUNTER — Ambulatory Visit (INDEPENDENT_AMBULATORY_CARE_PROVIDER_SITE_OTHER): Payer: Medicare Other | Admitting: Clinical

## 2021-12-24 DIAGNOSIS — F4310 Post-traumatic stress disorder, unspecified: Secondary | ICD-10-CM
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
## 2022-01-13 ENCOUNTER — Other Ambulatory Visit: Payer: Self-pay | Admitting: Internal Medicine

## 2022-01-13 DIAGNOSIS — E785 Hyperlipidemia, unspecified: Secondary | ICD-10-CM

## 2022-01-14 ENCOUNTER — Other Ambulatory Visit: Payer: Self-pay

## 2022-01-14 ENCOUNTER — Ambulatory Visit (INDEPENDENT_AMBULATORY_CARE_PROVIDER_SITE_OTHER): Payer: Medicaid Other | Admitting: Clinical

## 2022-01-14 DIAGNOSIS — F4310 Post-traumatic stress disorder, unspecified: Secondary | ICD-10-CM

## 2022-01-14 NOTE — Progress Notes (Signed)
Virtual Visit via Telephone Note   I connected with Isaiah Ellis on 01/14/22 at  1:00 PM EDT by telephone and verified that I am speaking with the correct person using two identifiers.   Location: Patient: Home Provider: Office   I discussed the limitations, risks, security and privacy concerns of performing an evaluation and management service by telephone and the availability of in person appointments. I also discussed with the patient that there may be a patient responsible charge related to this service. The patient expressed understanding and agreed to proceed.   THERAPIST PROGRESS NOTE   Session Time: 1:00 PM-1:45 PM   Participation Level: Active   Behavioral Response: CasualAlertIrritable   Type of Therapy: Individual Therapy   Treatment Goals addressed: Coping Anger Management   Interventions: CBT, Motivational Interviewing, Solution Focused and Supportive   Summary: Isaiah Ellis is a 61 y.o. male who presents with PTSD. The OPT therapist worked with the patient for his ongoing scheduled session. The OPT therapist utilized Motivational Interviewing to assist in creating therapeutic repore. The patient reviewed his symptoms, triggers, and behaviors over the past few weeks. 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 The patient spoke about his ongoing focus of the need to put himself first and improve how he interacts with others. The patient spoke about his change of thinking and continuing to have a positive mind set finding this to be a protective factor. The OPT therapist utilized Cognitive Behavioral Therapy through cognitive restructuring as well as worked with the patient on coping strategies to assist in management of PTSD and adjust to his ongoing recovery and limitations from having a stroke.The patient spoke about his continued work to complete in home tasks even with his limitations by breaking tasks down into smaller more  manageable pieces.    Suicidal/Homicidal: Nowithout intent/plan   Therapist Response: The OPT therapist worked with the patient for the patients scheduled session. The patient was engaged in his session and gave feedback in relation to triggers, symptoms, and behavior responses over the past few weeks. The OPT therapist worked with the patient utilizing an in session Cognitive Behavioral Therapy exercise. The patient was responsive in the session and verbalized,". I have been trying to make progress for the past 2 years and god looked after me ". The patient spoke about having a positive mentality and being determined to make the most of his second chance post his life changing stroke. The patient spoke about trying to encourage others who are struggling and giving encouragement.      Plan: Return again in 2/3 weeks.   Diagnosis:      Axis I: PTSD                         Axis II: No diagnosis     Collaboration of Care: No additional collaboration for this appointment  Patient/Guardian was advised Release of Information must be obtained prior to any record release in order to collaborate their care with an outside provider. Patient/Guardian was advised if they have not already done so to contact the registration department to sign all necessary forms in order for us to release information regarding their care.   Consent: Patient/Guardian gives verbal consent for treatment and assignment of benefits for services provided during this visit. Patient/Guardian expressed understanding and agreed to proceed.   I discussed the assessment and treatment plan with the patient. The  patient was provided an opportunity to ask questions and all were answered. The patient agreed with the plan and demonstrated an understanding of the instructions.   The patient was advised to call back or seek an in-person evaluation if the symptoms worsen or if the condition fails to improve as anticipated.   I provided 45  minutes of non-face-to-face time during this encounter.   Lennox Grumbles, LCSW   01/14/2022

## 2022-01-15 ENCOUNTER — Telehealth: Payer: Self-pay

## 2022-01-15 ENCOUNTER — Other Ambulatory Visit: Payer: Self-pay | Admitting: *Deleted

## 2022-01-15 DIAGNOSIS — I10 Essential (primary) hypertension: Secondary | ICD-10-CM

## 2022-01-15 MED ORDER — LISINOPRIL 20 MG PO TABS: ORAL_TABLET | ORAL | 3 refills | Status: DC

## 2022-01-15 MED ORDER — AMLODIPINE BESYLATE 10 MG PO TABS: 10.0000 mg | ORAL_TABLET | Freq: Every day | ORAL | 2 refills | Status: DC

## 2022-01-15 NOTE — Telephone Encounter (Signed)
Patient called need med refill ? ?Atorvastatin 40 mg ? ?Vitamin E ? ?Amlodipine 10 mg ? ?Lisinopril 20 mg ? ?Pharmacy:  [facility] ? ? ?

## 2022-01-15 NOTE — Telephone Encounter (Signed)
Medications sent to pharmacy vitamin E is over the counter  ?

## 2022-02-04 ENCOUNTER — Other Ambulatory Visit: Payer: Self-pay | Admitting: Internal Medicine

## 2022-02-04 DIAGNOSIS — E119 Type 2 diabetes mellitus without complications: Secondary | ICD-10-CM

## 2022-02-04 DIAGNOSIS — I10 Essential (primary) hypertension: Secondary | ICD-10-CM

## 2022-02-11 ENCOUNTER — Ambulatory Visit (INDEPENDENT_AMBULATORY_CARE_PROVIDER_SITE_OTHER): Payer: Medicaid Other | Admitting: Clinical

## 2022-02-11 ENCOUNTER — Other Ambulatory Visit: Payer: Self-pay

## 2022-02-11 DIAGNOSIS — F4310 Post-traumatic stress disorder, unspecified: Secondary | ICD-10-CM | POA: Diagnosis not present

## 2022-02-11 NOTE — Progress Notes (Signed)
Virtual Visit via Telephone Note ?  ?I connected with Isaiah Ellis on 02/11/22 at  1:00 PM EDT by telephone and verified that I am speaking with the correct person using two identifiers. ?  ?Location: ?Patient: Home ?Provider: Office ?  ?I discussed the limitations, risks, security and privacy concerns of performing an evaluation and management service by telephone and the availability of in person appointments. I also discussed with the patient that there may be a patient responsible charge related to this service. The patient expressed understanding and agreed to proceed. ?  ?THERAPIST PROGRESS NOTE ?  ?Session Time: 1:00 PM-1:20 PM ?  ?Participation Level: Active ?  ?Behavioral Response: CasualAlertIrritable ?  ?Type of Therapy: Individual Therapy ?  ?Treatment Goals addressed: Coping Anger Management ?  ?Interventions: CBT, Motivational Interviewing, Solution Focused and Supportive ?  ?Summary: Isaiah January. Ellis is a 61 y.o. male who presents with PTSD. The OPT therapist worked with the patient for his ongoing scheduled session. The OPT therapist utilized Motivational Interviewing to assist in creating therapeutic repore. The patient reviewed his symptoms, triggers, and behaviors over the past few weeks. The patient in this session spoke about his ongoing work to continue peaceful interactions with his neighbors in his living residence. The patient spoke about continuing to have a positive mind set. 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  ?  ?Suicidal/Homicidal:  Nowithout intent/plan ?  ?Therapist Response: The OPT therapist worked with the patient for the patients scheduled session. The patient was engaged in his session and gave feedback in relation to triggers, symptoms, and behavior responses over the past few weeks. The OPT therapist worked with the patient utilizing an in session Cognitive Behavioral Therapy exercise. The patient was responsive in the session and verbalized,". Things overall have been going well I have been doing what we have been talking about and not getting pulled into conflict with others and focusing on my path ". The patient spoke about having a positive mentality and being determined to make the most of his second chance post his life changing stroke. The patient spoke about trying to encourage others who are struggling and giving encouragement.  ?  ?  ?Plan: Return again in 2/3 weeks. ?  ?Diagnosis:      Axis I: PTSD ?                        Axis II: No diagnosis ?  ?  ?  ?Collaboration of Care: No additional collaboration for this appointment ?  ?Patient/Guardian was advised Release of Information must be obtained prior to any record release in order to collaborate their care with an outside provider. Patient/Guardian was advised if they have not already done so to contact the registration department to sign all necessary forms in order for [place] to release information regarding their care.  ?  ?Consent: Patient/Guardian gives verbal consent for treatment and assignment of benefits for services provided during this visit. Patient/Guardian expressed understanding and agreed to proceed.  ?  ?I discussed the assessment and treatment  plan with the patient. The patient was provided an opportunity to ask questions and all were answered. The patient agreed with the plan and demonstrated an understanding of the instructions. ?  ?The patient was advised to call back or seek an in-person evaluation if the symptoms worsen or if the condition fails to  improve as anticipated. ?  ?I provided 20 minutes of non-face-to-face time during this encounter. ?  ?Winfred Burn, LCSW ?  ?02/11/2022 ?

## 2022-02-17 ENCOUNTER — Ambulatory Visit (INDEPENDENT_AMBULATORY_CARE_PROVIDER_SITE_OTHER): Payer: Medicare Other

## 2022-02-17 DIAGNOSIS — Z0000 Encounter for general adult medical examination without abnormal findings: Secondary | ICD-10-CM | POA: Diagnosis not present

## 2022-02-17 NOTE — Progress Notes (Signed)
? ?Subjective:  ? Isaiah EhrichGeorge H Scarpati is a 61 y.o. male who presents for an Initial Medicare Annual Wellness Visit. ?I connected with  Isaiah EhrichGeorge H Ellis on 02/17/22 by a audio enabled telemedicine application and verified that I am speaking with the correct person using two identifiers. ? ?Patient Location: Home ? ?Provider Location: Office/Clinic ? ?I discussed the limitations of evaluation and management by telemedicine. The patient expressed understanding and agreed to proceed.  ?Review of Systems    ? ?Cardiac Risk Factors include: advanced age (>2655men, 76>65 women);male gender;diabetes mellitus;hypertension;smoking/ tobacco exposureNutrition Risk Assessment: ? ?Has the patient had any N/V/D within the last 2 months?  No  ?Does the patient have any non-healing wounds?  No  ?Has the patient had any unintentional weight loss or weight gain?  No  ? ?Diabetes: ? ?Is the patient diabetic?  Yes  ?If diabetic, was a CBG obtained today?  No  ?Did the patient bring in their glucometer from home?  No  ?How often do you monitor your CBG's? Only in office ? ?Financial Strains and Diabetes Management: ? ?Are you having any financial strains with the device, your supplies or your medication? No .  ?Does the patient want to be seen by Chronic Care Management for management of their diabetes?  No  ?Would the patient like to be referred to a Nutritionist or for Diabetic Management?  No  ? ?Diabetic Exams: ? ?Diabetic Eye Exam: Overdue for diabetic eye exam. Pt has been advised about the importance in completing this exam. Patient advised to call and schedule an eye exam. ?Diabetic Foot Exam: Completed 03/11/2021   ? ?   ?Objective:  ?  ?There were no vitals filed for this visit. ?There is no height or weight on file to calculate BMI. ? ? ?  02/17/2022  ?  1:08 PM 07/10/2020  ?  9:23 AM 05/25/2020  ?  1:20 PM 02/28/2019  ? 12:32 PM 02/13/2019  ? 12:37 PM  ?Advanced Directives  ?Does Patient Have a Medical Advance Directive? No No No No No  ?Would  patient like information on creating a medical advance directive? Yes (ED - Information included in AVS)  No - Patient declined No - Patient declined   ? ? ?Current Medications (verified) ?Outpatient Encounter Medications as of 02/17/2022  ?Medication Sig  ? amLODipine (NORVASC) 10 MG tablet Take 1 tablet (10 mg total) by mouth daily.  ? aspirin 81 MG EC tablet Take 1 tablet (81 mg total) by mouth daily. Swallow whole.  ? atorvastatin (LIPITOR) 40 MG tablet TAKE 1 TABLET BY MOUTH ONCE DAILY.  ? lisinopril (ZESTRIL) 20 MG tablet TAKE ONE TABLET BY MOUTH ONCE DAILY.  ? metFORMIN (GLUCOPHAGE) 500 MG tablet TAKE 1 TABLET BY MOUTH TWICE DAILY WITH A MEAL.  ? metoprolol tartrate (LOPRESSOR) 50 MG tablet TAKE 1 TABLET BY MOUTH TWICE DAILY.  ? UNABLE TO FIND Mechanical Lift Chair Dx: [id number]  ? UNABLE TO FIND Transfer Bench/Bath Chair Dx: [phone]  ? Vitamin D, Ergocalciferol, (DRISDOL) 1.25 MG (50000 UNIT) CAPS capsule Take 1 capsule (50,000 Units total) by mouth every 7 (seven) days.  ? ?No facility-administered encounter medications on file as of 02/17/2022.  ? ? ?Allergies (verified) ?Bee venom  ? ?History: ?Past Medical History:  ?Diagnosis Date  ? Balance problems   ? Depression   ? Diabetes mellitus without complication (HCC)   ? Falling   ? Gout   ? Hyperlipidemia   ? Hypertension   ? Stroke [facility](HCC)  2005 and 02/2019  ? ?No past surgical history on file. ?Family History  ?Problem Relation Age of Onset  ? Hypertension Mother   ? Stroke Mother   ? Heart disease Father   ? Stroke Father   ? Hypertension Father   ? ?Social History  ? ?Socioeconomic History  ? Marital status: Single  ?  Spouse name: Not on file  ? Number of children: 0  ? Years of education: Not on file  ? Highest education level: Associate degree: academic program  ?Occupational History  ? Not on file  ?Tobacco Use  ? Smoking status: Every Day  ?  Packs/day: 0.25  ?  Years: 40.00  ?  Pack years: 10.00  ?  Types: Cigarettes  ? Smokeless tobacco: Never  ? Tobacco  comments:  ?  2-3 cig/ day  ?Vaping Use  ? Vaping Use: Never used  ?Substance and Sexual Activity  ? Alcohol use: Not Currently  ?  Comment: occasional  ? Drug use: Yes  ?  Types: Marijuana, Cocaine  ?  Comment: couple of months, no cocaine, 08/08/20 marijuana every now and then  ? Sexual activity: Not on file  ?Other Topics Concern  ? Not on file  ?Social History Narrative  ? Lives alone  ? No caffeine  ? ?Social Determinants of Health  ? ?Financial Resource Strain: Not on file  ?Food Insecurity: Not on file  ?Transportation Needs: Not on file  ?Physical Activity: Not on file  ?Stress: Not on file  ?Social Connections: Not on file  ? ? ?Tobacco Counseling ?Ready to quit: Not Answered ?Counseling given: Not Answered ?Tobacco comments: 2-3 cig/ day ? ? ?Clinical Intake: ? ?  ? ?Pain : No/denies pain ? ?  ? ?Diabetes: Yes ?CBG done?: No ?Did pt. bring in CBG monitor from home?: No ? ?How often do you need to have someone help you when you read instructions, pamphlets, or other written materials from your doctor or pharmacy?: 1 - Never ? ?Diabetic?yes ? ?  ? ?  ? ? ?Activities of Daily Living ? ?  02/17/2022  ?  1:10 PM  ?In your present state of health, do you have any difficulty performing the following activities:  ?Hearing? 0  ?Vision? 1  ?Difficulty concentrating or making decisions? 1  ?Walking or climbing stairs? 1  ?Dressing or bathing? 0  ?Doing errands, shopping? 1  ?Preparing Food and eating ? N  ?Using the Toilet? N  ?In the past six months, have you accidently leaked urine? N  ?Do you have problems with loss of bowel control? N  ?Managing your Medications? N  ?Managing your Finances? N  ?Housekeeping or managing your Housekeeping? N  ? ? ?Patient Care Team: ?Anabel Halon, MD as PCP - General (Internal Medicine) ? ?Indicate any recent Medical Services you may have received from other than [facility] providers in the past year (date may be approximate). ? ?   ?Assessment:  ? This is a routine wellness examination  for Isaiah Ellis. ? ?Hearing/Vision screen ?No results found. ? ?Dietary issues and exercise activities discussed: ?Current Exercise Habits: Home exercise routine, Type of exercise: Other - see comments (patient has been walking some), Exercise limited by: orthopedic condition(s);cardiac condition(s) ? ? Goals Addressed   ? ?  ?  ?  ?  ? This Visit's Progress  ?  Patient Stated     ?  Patient states that he wants to be able to make it out to the dumpster and  mailbox by himself  ?  ? ?  ? ?Depression Screen ? ?  02/17/2022  ?  1:09 PM 11/21/2021  ?  1:30 PM 11/21/2021  ?  1:29 PM 07/23/2021  ?  1:27 PM 04/22/2021  ?  1:27 PM 03/11/2021  ?  2:20 PM  ?PHQ 2/9 Scores  ?PHQ - 2 Score 0 2 0 0 0 0  ?PHQ- 9 Score  3      ?  ?Fall Risk ? ?  02/17/2022  ?  1:09 PM 11/21/2021  ?  1:29 PM 07/23/2021  ?  1:27 PM 04/22/2021  ?  1:26 PM 03/11/2021  ?  2:20 PM  ?Fall Risk   ?Falls in the past year? 1 1 1  0 1  ?Number falls in past yr: 1 0 1 0 0  ?Injury with Fall? 0 1 0 0 1  ?Risk for fall due to : History of fall(s) History of fall(s) Impaired balance/gait;Impaired mobility No Fall Risks History of fall(s);Impaired balance/gait;Impaired mobility  ?Follow up Falls evaluation completed Falls evaluation completed Falls evaluation completed Falls evaluation completed Falls evaluation completed;Education provided;Falls prevention discussed;Follow up appointment  ? ? ?FALL RISK PREVENTION PERTAINING TO THE HOME: ? ?Any stairs in or around the home? No  ?If so, are there any without handrails?    ?Home free of loose throw rugs in walkways, pet beds, electrical cords, etc? Yes  ?Adequate lighting in your home to reduce risk of falls? Yes  ? ?ASSISTIVE DEVICES UTILIZED TO PREVENT FALLS: ? ?Life alert? Yes  ?Use of a cane, walker or w/c? Yes  ?Grab bars in the bathroom? Yes  ?Shower chair or bench in shower? Yes  ?Elevated toilet seat or a handicapped toilet? Yes  ? ?TIMED UP AND GO: ? ?Was the test performed? No .  ?Length of time to ambulate 10 feet:  sec.   ? ? ? ?Cognitive Function: ?  ?  ? ?  02/17/2022  ?  1:14 PM  ?6CIT Screen  ?What Year? 0 points  ?What month? 0 points  ?What time? 0 points  ?Count back from 20 0 points  ?Months in reverse 4 points  ?Repea

## 2022-02-17 NOTE — Patient Instructions (Signed)
?  Isaiah Ellis , ?Thank you for taking time to come for your Medicare Wellness Visit. I appreciate your ongoing commitment to your health goals. Please review the following plan we discussed and let me know if I can assist you in the future.  ? ?These are the goals we discussed: ? Goals   ? ?  Patient Stated   ?  Patient states that he wants to be able to make it out to the dumpster and mailbox by himself  ?  ? ?  ?  ?This is a list of the screening recommended for you and due dates:  ?Health Maintenance  ?Topic Date Due  ? HIV Screening  Never done  ? Colon Cancer Screening  Never done  ? Zoster (Shingles) Vaccine (1 of 2) Never done  ? COVID-19 Vaccine (3 - Booster for Moderna series) 05/22/2020  ? Eye exam for diabetics  09/26/2021  ? Stool Blood Test  11/05/2021  ? Complete foot exam   03/11/2022  ? Hemoglobin A1C  05/21/2022  ? Flu Shot  06/17/2022  ? Tetanus Vaccine  07/25/2030  ? Hepatitis C Screening: USPSTF Recommendation to screen - Ages 36-79 yo.  Completed  ? HPV Vaccine  Aged Out  ?  ?

## 2022-02-19 ENCOUNTER — Other Ambulatory Visit: Payer: Self-pay | Admitting: *Deleted

## 2022-02-19 MED ORDER — MALE URINAL MISC: 0 refills | Status: AC

## 2022-03-12 ENCOUNTER — Other Ambulatory Visit: Payer: Self-pay | Admitting: Internal Medicine

## 2022-03-12 DIAGNOSIS — E119 Type 2 diabetes mellitus without complications: Secondary | ICD-10-CM

## 2022-03-12 DIAGNOSIS — I10 Essential (primary) hypertension: Secondary | ICD-10-CM

## 2022-03-21 ENCOUNTER — Ambulatory Visit (HOSPITAL_COMMUNITY): Payer: Medicaid Other | Admitting: Clinical

## 2022-03-21 ENCOUNTER — Telehealth (HOSPITAL_COMMUNITY): Payer: Self-pay | Admitting: Clinical

## 2022-03-21 NOTE — Telephone Encounter (Signed)
The patient did not respond to contact attempts and patient mailbox is full so there is not a way to leave a VM ?

## 2022-03-24 ENCOUNTER — Ambulatory Visit: Payer: Medicaid Other | Admitting: Internal Medicine

## 2022-05-09 ENCOUNTER — Telehealth: Payer: Self-pay | Admitting: Internal Medicine

## 2022-05-09 ENCOUNTER — Other Ambulatory Visit: Payer: Self-pay | Admitting: *Deleted

## 2022-05-09 DIAGNOSIS — I10 Essential (primary) hypertension: Secondary | ICD-10-CM

## 2022-05-09 DIAGNOSIS — E119 Type 2 diabetes mellitus without complications: Secondary | ICD-10-CM

## 2022-05-09 DIAGNOSIS — E785 Hyperlipidemia, unspecified: Secondary | ICD-10-CM

## 2022-05-09 DIAGNOSIS — E559 Vitamin D deficiency, unspecified: Secondary | ICD-10-CM

## 2022-05-09 MED ORDER — METOPROLOL TARTRATE 50 MG PO TABS: 50.0000 mg | ORAL_TABLET | Freq: Two times a day (BID) | ORAL | 0 refills | Status: DC

## 2022-05-09 MED ORDER — AMLODIPINE BESYLATE 10 MG PO TABS: 10.0000 mg | ORAL_TABLET | Freq: Every day | ORAL | 2 refills | Status: DC

## 2022-05-09 MED ORDER — METFORMIN HCL 500 MG PO TABS: 500.0000 mg | ORAL_TABLET | Freq: Two times a day (BID) | ORAL | 0 refills | Status: DC

## 2022-05-09 MED ORDER — VITAMIN D (ERGOCALCIFEROL) 1.25 MG (50000 UNIT) PO CAPS: 50000.0000 [IU] | ORAL_CAPSULE | ORAL | 5 refills | Status: DC

## 2022-05-09 MED ORDER — LISINOPRIL 20 MG PO TABS: ORAL_TABLET | ORAL | 3 refills | Status: DC

## 2022-05-09 MED ORDER — ATORVASTATIN CALCIUM 40 MG PO TABS: 40.0000 mg | ORAL_TABLET | Freq: Every day | ORAL | 0 refills | Status: DC

## 2022-05-09 NOTE — Telephone Encounter (Signed)
Medication refills sent to pharmacy 

## 2022-05-22 ENCOUNTER — Other Ambulatory Visit: Payer: Self-pay | Admitting: *Deleted

## 2022-05-22 DIAGNOSIS — Z1211 Encounter for screening for malignant neoplasm of colon: Secondary | ICD-10-CM

## 2022-06-05 ENCOUNTER — Other Ambulatory Visit: Payer: Self-pay | Admitting: Internal Medicine

## 2022-06-05 DIAGNOSIS — I10 Essential (primary) hypertension: Secondary | ICD-10-CM

## 2022-06-12 ENCOUNTER — Other Ambulatory Visit: Payer: Self-pay

## 2022-06-12 ENCOUNTER — Telehealth: Payer: Self-pay | Admitting: Internal Medicine

## 2022-06-12 DIAGNOSIS — E119 Type 2 diabetes mellitus without complications: Secondary | ICD-10-CM

## 2022-06-12 MED ORDER — METFORMIN HCL 500 MG PO TABS: 500.0000 mg | ORAL_TABLET | Freq: Two times a day (BID) | ORAL | 2 refills | Status: DC

## 2022-06-12 NOTE — Telephone Encounter (Signed)
Refills sent

## 2022-06-12 NOTE — Telephone Encounter (Signed)
Pt called stating he is needing a refill on metFORMIN (GLUCOPHAGE) 500 MG tablet. Can you please refill?    [facility]

## 2022-07-17 ENCOUNTER — Other Ambulatory Visit: Payer: Self-pay

## 2022-07-17 ENCOUNTER — Telehealth: Payer: Self-pay | Admitting: Internal Medicine

## 2022-07-17 DIAGNOSIS — I10 Essential (primary) hypertension: Secondary | ICD-10-CM

## 2022-07-17 DIAGNOSIS — E119 Type 2 diabetes mellitus without complications: Secondary | ICD-10-CM

## 2022-07-17 MED ORDER — METOPROLOL TARTRATE 50 MG PO TABS: ORAL_TABLET | ORAL | 0 refills | Status: DC

## 2022-07-17 MED ORDER — AMLODIPINE BESYLATE 10 MG PO TABS: 10.0000 mg | ORAL_TABLET | Freq: Every day | ORAL | 2 refills | Status: DC

## 2022-07-17 MED ORDER — METFORMIN HCL 500 MG PO TABS: 500.0000 mg | ORAL_TABLET | Freq: Two times a day (BID) | ORAL | 2 refills | Status: DC

## 2022-07-17 NOTE — Telephone Encounter (Signed)
Patient needs refills to [facility]   metFORMIN (GLUCOPHAGE) 500 MG tablet   metoprolol tartrate (LOPRESSOR) 50 MG tablet   amLODipine (NORVASC) 10 MG tablet

## 2022-08-06 ENCOUNTER — Other Ambulatory Visit: Payer: Self-pay | Admitting: *Deleted

## 2022-08-06 ENCOUNTER — Telehealth: Payer: Self-pay | Admitting: Internal Medicine

## 2022-08-06 DIAGNOSIS — I10 Essential (primary) hypertension: Secondary | ICD-10-CM

## 2022-08-06 DIAGNOSIS — E559 Vitamin D deficiency, unspecified: Secondary | ICD-10-CM

## 2022-08-06 MED ORDER — AMLODIPINE BESYLATE 10 MG PO TABS: 10.0000 mg | ORAL_TABLET | Freq: Every day | ORAL | 2 refills | Status: DC

## 2022-08-06 MED ORDER — VITAMIN D (ERGOCALCIFEROL) 1.25 MG (50000 UNIT) PO CAPS: 50000.0000 [IU] | ORAL_CAPSULE | ORAL | 5 refills | Status: DC

## 2022-08-06 MED ORDER — LISINOPRIL 20 MG PO TABS: ORAL_TABLET | ORAL | 3 refills | Status: DC

## 2022-08-06 NOTE — Telephone Encounter (Signed)
Pt called stating he is needing a medication refill on amLODipine (NORVASC) 10 MG tablet, lisinopril (ZESTRIL) 20 MG tablet and Vitamin D, Ergocalciferol, (DRISDOL) 1.25 MG (50000 UNIT) CAPS capsule. Can you please refill?     [facility]

## 2022-08-06 NOTE — Telephone Encounter (Signed)
Pt medication sent to pharmacy  

## 2022-08-25 ENCOUNTER — Other Ambulatory Visit: Payer: Self-pay | Admitting: Internal Medicine

## 2022-08-25 DIAGNOSIS — I10 Essential (primary) hypertension: Secondary | ICD-10-CM

## 2022-09-09 ENCOUNTER — Other Ambulatory Visit: Payer: Self-pay | Admitting: Internal Medicine

## 2022-09-09 DIAGNOSIS — I10 Essential (primary) hypertension: Secondary | ICD-10-CM

## 2022-09-10 ENCOUNTER — Other Ambulatory Visit: Payer: Self-pay | Admitting: *Deleted

## 2022-09-10 ENCOUNTER — Telehealth: Payer: Self-pay | Admitting: Internal Medicine

## 2022-09-10 DIAGNOSIS — I10 Essential (primary) hypertension: Secondary | ICD-10-CM

## 2022-09-10 MED ORDER — AMLODIPINE BESYLATE 10 MG PO TABS: 10.0000 mg | ORAL_TABLET | Freq: Every day | ORAL | 2 refills | Status: DC

## 2022-09-10 MED ORDER — METOPROLOL TARTRATE 50 MG PO TABS: ORAL_TABLET | ORAL | 0 refills | Status: DC

## 2022-09-10 NOTE — Telephone Encounter (Signed)
Patient needs refills on   metoprolol tartrate (LOPRESSOR) 50 MG tablet   amLODipine (NORVASC) 10 MG tablet   [facility]

## 2022-09-10 NOTE — Telephone Encounter (Signed)
Medication sent to pharmacy  

## 2022-09-25 ENCOUNTER — Ambulatory Visit (INDEPENDENT_AMBULATORY_CARE_PROVIDER_SITE_OTHER): Payer: Medicare Other | Admitting: Internal Medicine

## 2022-09-25 ENCOUNTER — Encounter: Payer: Self-pay | Admitting: Internal Medicine

## 2022-09-25 VITALS — BP 111/75 | HR 63 | Ht 72.0 in | Wt 265.8 lb

## 2022-09-25 DIAGNOSIS — Z23 Encounter for immunization: Secondary | ICD-10-CM

## 2022-09-25 DIAGNOSIS — Z8673 Personal history of transient ischemic attack (TIA), and cerebral infarction without residual deficits: Secondary | ICD-10-CM

## 2022-09-25 DIAGNOSIS — Z1211 Encounter for screening for malignant neoplasm of colon: Secondary | ICD-10-CM | POA: Diagnosis not present

## 2022-09-25 DIAGNOSIS — E1169 Type 2 diabetes mellitus with other specified complication: Secondary | ICD-10-CM

## 2022-09-25 DIAGNOSIS — R55 Syncope and collapse: Secondary | ICD-10-CM | POA: Diagnosis not present

## 2022-09-25 DIAGNOSIS — I10 Essential (primary) hypertension: Secondary | ICD-10-CM

## 2022-09-25 DIAGNOSIS — E782 Mixed hyperlipidemia: Secondary | ICD-10-CM

## 2022-09-25 DIAGNOSIS — Z114 Encounter for screening for human immunodeficiency virus [HIV]: Secondary | ICD-10-CM

## 2022-09-25 MED ORDER — METOPROLOL TARTRATE 50 MG PO TABS: ORAL_TABLET | ORAL | 5 refills | Status: DC

## 2022-09-25 NOTE — Patient Instructions (Signed)
Please continue taking medications as prescribed.  Please continue to follow low salt diet.  You are being referred to Cardiology.

## 2022-09-25 NOTE — Assessment & Plan Note (Signed)
Recent episode of fall at home could be due to vasovagal syncope and/or orthostatic hypotension Needs to maintain adequate hydration  EKG: Sinus rhythm.  RBBB.  Concern for bifascicular block.  Similar to previous EKG.  Referred to cardiology as he had syncope, rule out cardiac etiology

## 2022-09-25 NOTE — Assessment & Plan Note (Signed)
BP Readings from Last 1 Encounters:  09/25/22 111/75   Well-controlled with Amlodipine, Lisinopril and Metoprolol Counseled for compliance with the medications Advised DASH diet and moderate exercise/walking, at least 150 mins/week

## 2022-09-25 NOTE — Progress Notes (Signed)
Established Patient Office Visit  Subjective:  [name]: Isaiah Ellis, male    DOB: December 26, 1960  Age: 61 y.o. MRN: [id number]  CC:  Chief Complaint  Patient presents with   Follow-up    Follow up, fall x 3 days ago no injury    HPI Isaiah Ellis is a 61 y.o. male with past medical history of HTN, type II DM, CVA, HLD, lumbar spinal stenosis and tobacco abuse who  presents for f/u of his chronic medical conditions and an episode of fall at home.  He had an unwitnessed fall at home on 11/03.  He denies any prodromal symptoms.  He reports that he was cleaning dishes in his kitchen and passed out.  He does not recall exactly for how long he had passed out.  He was on the floor and was confused later.  He denies any sick head injury or significant change in pain after the fall.  States that his BP has been well controlled at home.  Does not check his blood glucose, but his diabetes has been well controlled.  He has not a home nurse, who comes from Monday to Thursday.  HTN: BP is well-controlled. Takes medications regularly. Patient denies headache, dizziness, chest pain, dyspnea or palpitations.   Type 2 DM: He takes metformin regularly.  Denies any polyuria or polyphagia.  History of CVA: He has residual right-sided hemiplegia.  He is on aspirin and statin currently.  Past Medical History:  Diagnosis Date   Balance problems    Depression    Diabetes mellitus without complication ([place])    Falling    Gout    Hyperlipidemia    Hypertension    Stroke ([place]) 2005 and 02/2019    History reviewed. No pertinent surgical history.  Family History  Problem Relation Age of Onset   Hypertension Mother    Stroke Mother    Heart disease Father    Stroke Father    Hypertension Father     Social History   Socioeconomic History   Marital status: Single    Spouse name: Not on file   Number of children: 0   Years of education: Not on file   Highest education level: Associate degree:  academic program  Occupational History   Not on file  Tobacco Use   Smoking status: Every Day    Packs/day: 0.25    Years: 40.00    Total pack years: 10.00    Types: Cigarettes   Smokeless tobacco: Never   Tobacco comments:    2-3 cig/ day  Vaping Use   Vaping Use: Never used  Substance and Sexual Activity   Alcohol use: Not Currently    Comment: occasional   Drug use: Yes    Types: Marijuana, Cocaine    Comment: couple of months, no cocaine, 08/08/20 marijuana every now and then   Sexual activity: Not on file  Other Topics Concern   Not on file  Social History Narrative   Lives alone   No caffeine   Social Determinants of Health   Financial Resource Strain: Not on file  Food Insecurity: Not on file  Transportation Needs: Not on file  Physical Activity: Not on file  Stress: Not on file  Social Connections: Not on file  Intimate Partner Violence: Not on file    Outpatient Medications Prior to Visit  Medication Sig Dispense Refill   amLODipine (NORVASC) 10 MG tablet Take 1 tablet (10 mg total) by mouth daily. [place]  tablet 2   aspirin 81 MG EC tablet Take 1 tablet (81 mg total) by mouth daily. Swallow whole. 30 tablet 12   atorvastatin (LIPITOR) 40 MG tablet Take 1 tablet (40 mg total) by mouth daily. 90 tablet 0   Incontinence Supplies (MALE URINAL) MISC PRN 6 each 0   lisinopril (ZESTRIL) 20 MG tablet TAKE ONE TABLET BY MOUTH ONCE DAILY. 30 tablet 3   metFORMIN (GLUCOPHAGE) 500 MG tablet Take 1 tablet (500 mg total) by mouth 2 (two) times daily with a meal. 60 tablet 2   UNABLE TO FIND Mechanical Lift Chair Dx: Z86.73 1 each 0   UNABLE TO FIND Transfer Bench/Bath Chair Dx: D53.29 1 each 0   metoprolol tartrate (LOPRESSOR) 50 MG tablet TAKE (1) TABLET BY MOUTH TWICE DAILY. 60 tablet 0   Vitamin D, Ergocalciferol, (DRISDOL) 1.25 MG (50000 UNIT) CAPS capsule Take 1 capsule (50,000 Units total) by mouth every 7 (seven) days. 5 capsule 5   No facility-administered medications  prior to visit.    Allergies  Allergen Reactions   Bee Venom Swelling    Localized     ROS Review of Systems  Constitutional:  Negative for chills and fever.  HENT:  Negative for congestion and sore throat.   Eyes:  Negative for pain and discharge.  Respiratory:  Negative for cough and shortness of breath.   Cardiovascular:  Negative for chest pain and palpitations.  Gastrointestinal:  Negative for constipation, diarrhea, nausea and vomiting.  Endocrine: Negative for polydipsia and polyuria.  Genitourinary:  Negative for dysuria and hematuria.  Musculoskeletal:  Positive for arthralgias. Negative for neck pain and neck stiffness.  Skin:  Negative for rash.  Neurological:  Positive for weakness and numbness (LE, intermittent). Negative for dizziness and headaches.  Psychiatric/Behavioral:  Negative for agitation and behavioral problems.       Objective:    Physical Exam Vitals reviewed.  Constitutional:      General: He is not in acute distress.    Appearance: He is obese. He is not diaphoretic.     Comments: In wheelchair  HENT:     Head: Normocephalic and atraumatic.     Nose: Nose normal.     Mouth/Throat:     Mouth: Mucous membranes are moist.  Eyes:     General: No scleral icterus.    Extraocular Movements: Extraocular movements intact.  Cardiovascular:     Rate and Rhythm: Normal rate and regular rhythm.     Pulses: Normal pulses.     Heart sounds: Normal heart sounds. No murmur heard. Pulmonary:     Breath sounds: Normal breath sounds. No wheezing or rales.  Musculoskeletal:     Cervical back: Neck supple. No tenderness.     Right lower leg: No edema.     Left lower leg: No edema.  Skin:    General: Skin is warm.     Findings: No rash.  Neurological:     General: No focal deficit present.     Mental Status: He is alert and oriented to person, place, and time.     Sensory: No sensory deficit.     Motor: Weakness (b/l LE - muscle strength 4/5) present.      Gait: Gait abnormal (Slow, unsteady).  Psychiatric:        Mood and Affect: Mood normal.        Behavior: Behavior normal.     BP 111/75 (BP Location: Right Arm, Patient Position: Sitting, Cuff Size: Normal)  Pulse 63   Ht 6' (1.829 m)   Wt 265 lb 12.8 oz (120.6 kg)   SpO2 98%   BMI 36.05 kg/m  Wt Readings from Last 3 Encounters:  09/25/22 265 lb 12.8 oz (120.6 kg)  11/21/21 298 lb 1.9 oz (135.2 kg)  07/23/21 (!) 306 lb (138.8 kg)    Lab Results  Component Value Date   TSH 0.450 11/21/2021   Lab Results  Component Value Date   WBC 8.0 11/21/2021   HGB 12.5 (L) 11/21/2021   HCT 38.4 11/21/2021   MCV 78 (L) 11/21/2021   PLT 230 11/21/2021   Lab Results  Component Value Date   NA 143 11/21/2021   K 5.1 11/21/2021   CO2 23 11/21/2021   GLUCOSE 81 11/21/2021   BUN 19 11/21/2021   CREATININE 1.31 (H) 11/21/2021   BILITOT 0.4 11/21/2021   ALKPHOS 101 11/21/2021   AST 15 11/21/2021   ALT 8 11/21/2021   PROT 6.9 11/21/2021   ALBUMIN 4.6 11/21/2021   CALCIUM 10.0 11/21/2021   ANIONGAP 6 12/14/2020   EGFR 62 11/21/2021   Lab Results  Component Value Date   CHOL 176 11/21/2021   Lab Results  Component Value Date   HDL 58 11/21/2021   Lab Results  Component Value Date   LDLCALC 105 (H) 11/21/2021   Lab Results  Component Value Date   TRIG 68 11/21/2021   Lab Results  Component Value Date   CHOLHDL 3.0 11/21/2021   Lab Results  Component Value Date   HGBA1C 5.8 (H) 11/21/2021      Assessment & Plan:   Problem List Items Addressed This Visit       Cardiovascular and Mediastinum   Essential hypertension    BP Readings from Last 1 Encounters:  09/25/22 111/75  Well-controlled with Amlodipine, Lisinopril and Metoprolol Counseled for compliance with the medications Advised DASH diet and moderate exercise/walking, at least 150 mins/week      Relevant Medications   metoprolol tartrate (LOPRESSOR) 50 MG tablet   Other Relevant Orders    Ambulatory referral to Cardiology   CBC with Differential/Platelet   TSH     Endocrine   Type 2 diabetes mellitus with other specified complication ([place]) - Primary    Lab Results  Component Value Date   HGBA1C 5.8 (H) 11/21/2021  Associated with HTN and HLD Well-controlled with Metformin On statin and ACEi Check CMP and HbA1C Advised to follow diabetic diet Diabetic eye exam: Advised to follow up with Ophthalmology for diabetic eye exam      Relevant Orders   Hemoglobin A1c   CMP14+EGFR   Urine Microalbumin w/creat. ratio     Other   Hyperlipidemia    On statin Check lipid profile      Relevant Medications   metoprolol tartrate (LOPRESSOR) 50 MG tablet   Other Relevant Orders   Lipid panel   History of CVA (cerebrovascular accident)    On Aspirin and statin Used to follow up with Neurology - had right sided hemiplegia      Relevant Orders   Ambulatory referral to Cardiology   Lipid panel   CBC with Differential/Platelet   TSH   Syncope    Recent episode of fall at home could be due to vasovagal syncope and/or orthostatic hypotension Needs to maintain adequate hydration  EKG: Sinus rhythm.  RBBB.  Concern for bifascicular block.  Similar to previous EKG.  Referred to cardiology as he had syncope, rule out cardiac etiology  Relevant Orders   Ambulatory referral to Cardiology   CBC with Differential/Platelet   TSH   EKG 12-Lead (Completed)   Other Visit Diagnoses     Screening for HIV (human immunodeficiency virus)       Relevant Orders   HIV antibody (with reflex)   Colon cancer screening       Relevant Orders   Cologuard   Need for immunization against influenza       Relevant Orders   Flu Vaccine QUAD 89moIM (Fluarix, Fluzone & Alfiuria Quad PF) (Completed)       Meds ordered this encounter  Medications   metoprolol tartrate (LOPRESSOR) 50 MG tablet    Sig: TAKE (1) TABLET BY MOUTH TWICE DAILY.    Dispense:  60 tablet    Refill:  5     Follow-up: Return in about 3 months (around 12/26/2022) for Annual physical.    RLindell Spar MD

## 2022-09-25 NOTE — Assessment & Plan Note (Signed)
On Aspirin and statin Used to follow up with Neurology - had right sided hemiplegia

## 2022-09-25 NOTE — Assessment & Plan Note (Signed)
Lab Results  Component Value Date   HGBA1C 5.8 (H) 11/21/2021   Associated with HTN and HLD Well-controlled with Metformin On statin and ACEi Check CMP and HbA1C Advised to follow diabetic diet Diabetic eye exam: Advised to follow up with Ophthalmology for diabetic eye exam

## 2022-09-25 NOTE — Assessment & Plan Note (Signed)
On statin Check lipid profile 

## 2022-09-26 LAB — HIV ANTIBODY (ROUTINE TESTING W REFLEX): HIV Screen 4th Generation wRfx: NONREACTIVE

## 2022-09-27 LAB — CMP14+EGFR
ALT: 8 IU/L (ref 0–44)
AST: 10 IU/L (ref 0–40)
Albumin/Globulin Ratio: 2 (ref 1.2–2.2)
Albumin: 4.3 g/dL (ref 3.9–4.9)
Alkaline Phosphatase: 80 IU/L (ref 44–121)
BUN/Creatinine Ratio: 14 (ref 10–24)
BUN: 16 mg/dL (ref 8–27)
Bilirubin Total: 0.3 mg/dL (ref 0.0–1.2)
CO2: 25 mmol/L (ref 20–29)
Calcium: 9.8 mg/dL (ref 8.6–10.2)
Chloride: 103 mmol/L (ref 96–106)
Creatinine, Ser: 1.18 mg/dL (ref 0.76–1.27)
Globulin, Total: 2.1 g/dL (ref 1.5–4.5)
Glucose: 75 mg/dL (ref 70–99)
Potassium: 4.5 mmol/L (ref 3.5–5.2)
Sodium: 142 mmol/L (ref 134–144)
Total Protein: 6.4 g/dL (ref 6.0–8.5)
eGFR: 70 mL/min/{1.73_m2} (ref >59)

## 2022-09-27 LAB — MICROALBUMIN / CREATININE URINE RATIO
Creatinine, Urine: 218.6 mg/dL
Microalb/Creat Ratio: 8 mg/g creat (ref 0–29)
Microalbumin, Urine: 18.1 ug/mL

## 2022-09-27 LAB — LIPID PANEL
Chol/HDL Ratio: 2.5 ratio (ref 0.0–5.0)
Cholesterol, Total: 132 mg/dL (ref 100–199)
HDL: 52 mg/dL (ref >39)
LDL Chol Calc (NIH): 64 mg/dL (ref 0–99)
Triglycerides: 84 mg/dL (ref 0–149)
VLDL Cholesterol Cal: 16 mg/dL (ref 5–40)

## 2022-09-27 LAB — CBC WITH DIFFERENTIAL/PLATELET
Basophils Absolute: 0.1 10*3/uL (ref 0.0–0.2)
Basos: 1 %
EOS (ABSOLUTE): 0.8 10*3/uL — ABNORMAL HIGH (ref 0.0–0.4)
Eos: 9 %
Hematocrit: 38.3 % (ref 37.5–51.0)
Hemoglobin: 12.4 g/dL — ABNORMAL LOW (ref 13.0–17.7)
Immature Grans (Abs): 0.1 10*3/uL (ref 0.0–0.1)
Immature Granulocytes: 1 %
Lymphocytes Absolute: 1.6 10*3/uL (ref 0.7–3.1)
Lymphs: 17 %
MCH: 25.3 pg — ABNORMAL LOW (ref 26.6–33.0)
MCHC: 32.4 g/dL (ref 31.5–35.7)
MCV: 78 fL — ABNORMAL LOW (ref 79–97)
Monocytes Absolute: 1.1 10*3/uL — ABNORMAL HIGH (ref 0.1–0.9)
Monocytes: 12 %
Neutrophils Absolute: 5.4 10*3/uL (ref 1.4–7.0)
Neutrophils: 60 %
Platelets: 201 10*3/uL (ref 150–450)
RBC: 4.91 x10E6/uL (ref 4.14–5.80)
RDW: 17.1 % — ABNORMAL HIGH (ref 11.6–15.4)
WBC: 9.1 10*3/uL (ref 3.4–10.8)

## 2022-09-27 LAB — TSH: TSH: 0.597 u[IU]/mL (ref 0.450–4.500)

## 2022-09-27 LAB — HEMOGLOBIN A1C
Est. average glucose Bld gHb Est-mCnc: 117 mg/dL
Hgb A1c MFr Bld: 5.7 % — ABNORMAL HIGH (ref 4.8–5.6)

## 2022-09-29 ENCOUNTER — Other Ambulatory Visit: Payer: Self-pay | Admitting: Internal Medicine

## 2022-09-29 DIAGNOSIS — E119 Type 2 diabetes mellitus without complications: Secondary | ICD-10-CM

## 2022-10-20 ENCOUNTER — Telehealth: Payer: Self-pay | Admitting: *Deleted

## 2022-10-20 NOTE — Patient Outreach (Signed)
  Care Coordination   10/20/2022 Name: Isaiah Ellis MRN: [id number] DOB: 10-07-1961   Care Coordination Outreach Attempts:  An unsuccessful telephone outreach was attempted today to offer the patient information about available care coordination services as a benefit of their health plan.   Follow Up Plan:  Additional outreach attempts will be made to offer the patient care coordination information and services.   Encounter Outcome:  No Answer   Care Coordination Interventions:  No, not indicated    SIG Kahne Helfand C. Burgess Estelle, MSN, [facility] Gerontological Nurse Practitioner [facility] Care Management [phone]

## 2022-11-04 ENCOUNTER — Telehealth: Payer: Self-pay | Admitting: Internal Medicine

## 2022-11-04 ENCOUNTER — Other Ambulatory Visit: Payer: Self-pay

## 2022-11-04 DIAGNOSIS — I10 Essential (primary) hypertension: Secondary | ICD-10-CM

## 2022-11-04 MED ORDER — LISINOPRIL 20 MG PO TABS: ORAL_TABLET | ORAL | 3 refills | Status: DC

## 2022-11-04 MED ORDER — AMLODIPINE BESYLATE 10 MG PO TABS: 10.0000 mg | ORAL_TABLET | Freq: Every day | ORAL | 2 refills | Status: DC

## 2022-11-04 NOTE — Telephone Encounter (Signed)
Prescription Request  11/04/2022  Is this a "Controlled Substance" medicine? No  LOV: 09/25/2022  What is the name of the medication or equipment? amLODipine (NORVASC) 10 MG tablet [[id number]]   lisinopril (ZESTRIL) 20 MG tablet [[id number]]   Have you contacted your pharmacy to request a refill? No   Which pharmacy would you like this sent to?  [facility] - [place], [place] - [place] [address] [place] [address] Phone: [phone] Fax: [phone]  [facility] - [place], [place] - [address] [place] [address] Phone: [phone] Fax: [phone]    Patient notified that their request is being sent to the clinical staff for review and that they should receive a response within 2 business days.   Please advise at Mobile [phone] (mobile)

## 2022-11-04 NOTE — Telephone Encounter (Signed)
Rx sent patient advised

## 2022-12-09 ENCOUNTER — Other Ambulatory Visit: Payer: Self-pay | Admitting: Internal Medicine

## 2022-12-09 DIAGNOSIS — E119 Type 2 diabetes mellitus without complications: Secondary | ICD-10-CM

## 2022-12-09 MED ORDER — METFORMIN HCL 500 MG PO TABS: 500.0000 mg | ORAL_TABLET | Freq: Two times a day (BID) | ORAL | 1 refills | Status: DC

## 2022-12-18 ENCOUNTER — Encounter (HOSPITAL_COMMUNITY): Payer: Self-pay | Admitting: *Deleted

## 2022-12-18 ENCOUNTER — Emergency Department (HOSPITAL_COMMUNITY): Payer: 59

## 2022-12-18 ENCOUNTER — Other Ambulatory Visit: Payer: Self-pay

## 2022-12-18 ENCOUNTER — Emergency Department: Payer: Self-pay

## 2022-12-18 ENCOUNTER — Inpatient Hospital Stay (HOSPITAL_COMMUNITY)
Admission: EM | Admit: 2022-12-18 | Discharge: 2022-12-22 | DRG: 683 | Disposition: A | Discharge status: Skilled Nursing Facility | Payer: 59 | Attending: Internal Medicine | Admitting: Internal Medicine

## 2022-12-18 DIAGNOSIS — R262 Difficulty in walking, not elsewhere classified: Secondary | ICD-10-CM | POA: Diagnosis present

## 2022-12-18 DIAGNOSIS — Z9181 History of falling: Secondary | ICD-10-CM | POA: Diagnosis not present

## 2022-12-18 DIAGNOSIS — G8191 Hemiplegia, unspecified affecting right dominant side: Secondary | ICD-10-CM | POA: Diagnosis present

## 2022-12-18 DIAGNOSIS — N401 Enlarged prostate with lower urinary tract symptoms: Secondary | ICD-10-CM | POA: Diagnosis present

## 2022-12-18 DIAGNOSIS — F17200 Nicotine dependence, unspecified, uncomplicated: Secondary | ICD-10-CM | POA: Diagnosis not present

## 2022-12-18 DIAGNOSIS — Z823 Family history of stroke: Secondary | ICD-10-CM | POA: Diagnosis not present

## 2022-12-18 DIAGNOSIS — F17210 Nicotine dependence, cigarettes, uncomplicated: Secondary | ICD-10-CM | POA: Diagnosis present

## 2022-12-18 DIAGNOSIS — Z79899 Other long term (current) drug therapy: Secondary | ICD-10-CM

## 2022-12-18 DIAGNOSIS — R338 Other retention of urine: Secondary | ICD-10-CM

## 2022-12-18 DIAGNOSIS — Z6831 Body mass index (BMI) 31.0-31.9, adult: Secondary | ICD-10-CM | POA: Diagnosis not present

## 2022-12-18 DIAGNOSIS — R32 Unspecified urinary incontinence: Secondary | ICD-10-CM | POA: Diagnosis present

## 2022-12-18 DIAGNOSIS — Z91030 Bee allergy status: Secondary | ICD-10-CM | POA: Diagnosis not present

## 2022-12-18 DIAGNOSIS — Z960 Presence of urogenital implants: Secondary | ICD-10-CM | POA: Diagnosis not present

## 2022-12-18 DIAGNOSIS — M6281 Muscle weakness (generalized): Secondary | ICD-10-CM | POA: Diagnosis not present

## 2022-12-18 DIAGNOSIS — Z8673 Personal history of transient ischemic attack (TIA), and cerebral infarction without residual deficits: Secondary | ICD-10-CM | POA: Diagnosis not present

## 2022-12-18 DIAGNOSIS — I10 Essential (primary) hypertension: Secondary | ICD-10-CM | POA: Diagnosis present

## 2022-12-18 DIAGNOSIS — E669 Obesity, unspecified: Secondary | ICD-10-CM | POA: Diagnosis present

## 2022-12-18 DIAGNOSIS — E8721 Acute metabolic acidosis: Secondary | ICD-10-CM | POA: Diagnosis present

## 2022-12-18 DIAGNOSIS — N179 Acute kidney failure, unspecified: Secondary | ICD-10-CM | POA: Diagnosis not present

## 2022-12-18 DIAGNOSIS — Z8249 Family history of ischemic heart disease and other diseases of the circulatory system: Secondary | ICD-10-CM | POA: Diagnosis not present

## 2022-12-18 DIAGNOSIS — R339 Retention of urine, unspecified: Secondary | ICD-10-CM | POA: Diagnosis not present

## 2022-12-18 DIAGNOSIS — R531 Weakness: Secondary | ICD-10-CM | POA: Diagnosis not present

## 2022-12-18 DIAGNOSIS — Z7982 Long term (current) use of aspirin: Secondary | ICD-10-CM

## 2022-12-18 DIAGNOSIS — E860 Dehydration: Secondary | ICD-10-CM | POA: Diagnosis present

## 2022-12-18 DIAGNOSIS — Z743 Need for continuous supervision: Secondary | ICD-10-CM | POA: Diagnosis not present

## 2022-12-18 DIAGNOSIS — R269 Unspecified abnormalities of gait and mobility: Secondary | ICD-10-CM | POA: Diagnosis not present

## 2022-12-18 DIAGNOSIS — E785 Hyperlipidemia, unspecified: Secondary | ICD-10-CM | POA: Diagnosis present

## 2022-12-18 DIAGNOSIS — M48061 Spinal stenosis, lumbar region without neurogenic claudication: Secondary | ICD-10-CM | POA: Diagnosis not present

## 2022-12-18 DIAGNOSIS — E66811 Obesity, class 1: Secondary | ICD-10-CM

## 2022-12-18 DIAGNOSIS — E1169 Type 2 diabetes mellitus with other specified complication: Secondary | ICD-10-CM | POA: Diagnosis present

## 2022-12-18 DIAGNOSIS — R293 Abnormal posture: Secondary | ICD-10-CM | POA: Diagnosis not present

## 2022-12-18 DIAGNOSIS — W19XXXA Unspecified fall, initial encounter: Secondary | ICD-10-CM | POA: Diagnosis present

## 2022-12-18 DIAGNOSIS — Z1152 Encounter for screening for COVID-19: Secondary | ICD-10-CM

## 2022-12-18 DIAGNOSIS — N320 Bladder-neck obstruction: Secondary | ICD-10-CM | POA: Diagnosis not present

## 2022-12-18 DIAGNOSIS — Z7984 Long term (current) use of oral hypoglycemic drugs: Secondary | ICD-10-CM | POA: Diagnosis not present

## 2022-12-18 DIAGNOSIS — I6381 Other cerebral infarction due to occlusion or stenosis of small artery: Secondary | ICD-10-CM | POA: Diagnosis not present

## 2022-12-18 DIAGNOSIS — E119 Type 2 diabetes mellitus without complications: Secondary | ICD-10-CM | POA: Diagnosis not present

## 2022-12-18 DIAGNOSIS — R410 Disorientation, unspecified: Secondary | ICD-10-CM | POA: Diagnosis not present

## 2022-12-18 LAB — GLUCOSE, CAPILLARY
Glucose-Capillary: 85 mg/dL (ref 70–99)
Glucose-Capillary: 118 mg/dL — ABNORMAL HIGH (ref 70–99)

## 2022-12-18 LAB — TROPONIN I (HIGH SENSITIVITY)
Troponin I (High Sensitivity): 23 ng/L — ABNORMAL HIGH (ref <18)
Troponin I (High Sensitivity): 23 ng/L — ABNORMAL HIGH (ref <18)

## 2022-12-18 LAB — CBC WITH DIFFERENTIAL/PLATELET
Abs Immature Granulocytes: 0.05 10*3/uL (ref 0.00–0.07)
Basophils Absolute: 0.1 10*3/uL (ref 0.0–0.1)
Basophils Relative: 1 %
Eosinophils Absolute: 0.1 10*3/uL (ref 0.0–0.5)
Eosinophils Relative: 2 %
HCT: 35.1 % — ABNORMAL LOW (ref 39.0–52.0)
Hemoglobin: 11.3 g/dL — ABNORMAL LOW (ref 13.0–17.0)
Immature Granulocytes: 1 %
Lymphocytes Relative: 7 %
Lymphs Abs: 0.6 10*3/uL — ABNORMAL LOW (ref 0.7–4.0)
MCH: 24.7 pg — ABNORMAL LOW (ref 26.0–34.0)
MCHC: 32.2 g/dL (ref 30.0–36.0)
MCV: 76.6 fL — ABNORMAL LOW (ref 80.0–100.0)
Monocytes Absolute: 1.4 10*3/uL — ABNORMAL HIGH (ref 0.1–1.0)
Monocytes Relative: 16 %
Neutro Abs: 6.2 10*3/uL (ref 1.7–7.7)
Neutrophils Relative %: 73 %
Platelets: 224 10*3/uL (ref 150–400)
RBC: 4.58 MIL/uL (ref 4.22–5.81)
RDW: 17.2 % — ABNORMAL HIGH (ref 11.5–15.5)
WBC: 8.5 10*3/uL (ref 4.0–10.5)
nRBC: 0 % (ref 0.0–0.2)

## 2022-12-18 LAB — URINALYSIS, W/ REFLEX TO CULTURE (INFECTION SUSPECTED)
Bilirubin Urine: NEGATIVE
Glucose, UA: NEGATIVE mg/dL
Ketones, ur: NEGATIVE mg/dL
Leukocytes,Ua: NEGATIVE
Nitrite: NEGATIVE
Protein, ur: 100 mg/dL — AB
Specific Gravity, Urine: 1.011 (ref 1.005–1.030)
pH: 5 (ref 5.0–8.0)

## 2022-12-18 LAB — COMPREHENSIVE METABOLIC PANEL
ALT: 12 U/L (ref 0–44)
AST: 21 U/L (ref 15–41)
Albumin: 4 g/dL (ref 3.5–5.0)
Alkaline Phosphatase: 83 U/L (ref 38–126)
Anion gap: 15 (ref 5–15)
BUN: 103 mg/dL — ABNORMAL HIGH (ref 8–23)
CO2: 17 mmol/L — ABNORMAL LOW (ref 22–32)
Calcium: 9.5 mg/dL (ref 8.9–10.3)
Chloride: 104 mmol/L (ref 98–111)
Creatinine, Ser: 6.41 mg/dL — ABNORMAL HIGH (ref 0.61–1.24)
GFR, Estimated: 9 mL/min — ABNORMAL LOW (ref >60)
Glucose, Bld: 84 mg/dL (ref 70–99)
Potassium: 4.1 mmol/L (ref 3.5–5.1)
Sodium: 136 mmol/L (ref 135–145)
Total Bilirubin: 0.4 mg/dL (ref 0.3–1.2)
Total Protein: 7.4 g/dL (ref 6.5–8.1)

## 2022-12-18 LAB — RESP PANEL BY RT-PCR (RSV, FLU A&B, COVID)  RVPGX2
Influenza A by PCR: NEGATIVE
Influenza B by PCR: NEGATIVE
Resp Syncytial Virus by PCR: NEGATIVE
SARS Coronavirus 2 by RT PCR: NEGATIVE

## 2022-12-18 LAB — CBG MONITORING, ED: Glucose-Capillary: 77 mg/dL (ref 70–99)

## 2022-12-18 LAB — ETHANOL: Alcohol, Ethyl (B): <10 mg/dL (ref <10)

## 2022-12-18 MED ORDER — HEPARIN SODIUM (PORCINE) 5000 UNIT/ML IJ SOLN
5000.0000 [IU] | Freq: Three times a day (TID) | INTRAMUSCULAR | Status: DC
  Administered 2022-12-18 – 2022-12-22 (×11): 5000 [IU] via SUBCUTANEOUS
  Filled 2022-12-18 (×11): qty 1

## 2022-12-18 MED ORDER — INSULIN ASPART 100 UNIT/ML IJ SOLN: 0.0000 [IU] | Freq: Every day | INTRAMUSCULAR | Status: DC

## 2022-12-18 MED ORDER — METOPROLOL TARTRATE 25 MG PO TABS
25.0000 mg | ORAL_TABLET | Freq: Two times a day (BID) | ORAL | Status: DC
  Administered 2022-12-18 – 2022-12-22 (×8): 25 mg via ORAL
  Filled 2022-12-18 (×8): qty 1

## 2022-12-18 MED ORDER — ACETAMINOPHEN 325 MG PO TABS: 650.0000 mg | ORAL_TABLET | Freq: Four times a day (QID) | ORAL | Status: DC | PRN

## 2022-12-18 MED ORDER — SODIUM CHLORIDE 0.9 % IV SOLN: INTRAVENOUS | Status: DC

## 2022-12-18 MED ORDER — LACTATED RINGERS IV BOLUS
500.0000 mL | Freq: Once | INTRAVENOUS | Status: AC
  Administered 2022-12-18: 500 mL via INTRAVENOUS

## 2022-12-18 MED ORDER — AMLODIPINE BESYLATE 5 MG PO TABS
10.0000 mg | ORAL_TABLET | Freq: Every day | ORAL | Status: DC
  Filled 2022-12-18: qty 2

## 2022-12-18 MED ORDER — ACETAMINOPHEN 650 MG RE SUPP: 650.0000 mg | Freq: Four times a day (QID) | RECTAL | Status: DC | PRN

## 2022-12-18 MED ORDER — ASPIRIN 81 MG PO TBEC
81.0000 mg | DELAYED_RELEASE_TABLET | Freq: Every day | ORAL | Status: DC
  Administered 2022-12-19 – 2022-12-22 (×4): 81 mg via ORAL
  Filled 2022-12-18 (×4): qty 1

## 2022-12-18 MED ORDER — LACTATED RINGERS IV BOLUS
1000.0000 mL | Freq: Once | INTRAVENOUS | Status: AC
  Administered 2022-12-18: 1000 mL via INTRAVENOUS

## 2022-12-18 MED ORDER — TAMSULOSIN HCL 0.4 MG PO CAPS
0.4000 mg | ORAL_CAPSULE | Freq: Every day | ORAL | Status: DC
  Administered 2022-12-18 – 2022-12-21 (×4): 0.4 mg via ORAL
  Filled 2022-12-18 (×4): qty 1

## 2022-12-18 MED ORDER — INSULIN ASPART 100 UNIT/ML IJ SOLN: 0.0000 [IU] | Freq: Three times a day (TID) | INTRAMUSCULAR | Status: DC

## 2022-12-18 NOTE — H&P (Signed)
TRH H&P   Patient Demographics:    Isaiah Ellis, is a 62 y.o. male  MRN: [id number]   DOB - 03-Jul-1961  Admit Date - 12/18/2022  Outpatient Primary MD for the patient is Lindell Spar, MD  Referring MD/NP/PA: Dr Verta Ellen  Patient coming from: home  Chief Complaint  Patient presents with   Weakness      HPI:    Isaiah Ellis  is a 62 y.o. male, with past medical history of diabetes mellitus, hypertension, hyperlipidemia, he presents to ED secondary to complaint generalized weakness, fatigue and trouble walking, he says he had a fall, but he did not have a significant injury, he has chronic right-sided weakness, this is status seems unchanged, by himself, with a visiting nurse, he denies headache, nausea, vomiting, chest pain, he does report difficulty urinating,. -In ED his creatinine was noticed to be elevated at 6.9, from baseline of 1, blood pressure was soft, bladder scan was significant for> 9 9, Foley catheter was inserted with immediate 2.2 urine output (there was more urine but it had foley was clamped temporarily) [facility] hospitalist consulted to admit.    Review of systems:   A full 10 point Review of Systems was done, except as stated above, all other Review of Systems were negative.   With Past History of the following :    Past Medical History:  Diagnosis Date   Balance problems    Depression    Diabetes mellitus without complication ([place])    Falling    Gout    Hyperlipidemia    Hypertension    Stroke ([place]) 2005 and 02/2019      History reviewed. No pertinent surgical history.    Social History:     Social History   Tobacco Use   Smoking status: Every Day    Packs/day: 0.25    Years: 40.00    Total pack years: 10.00    Types: Cigarettes   Smokeless tobacco: Never   Tobacco comments:    2-3 cig/ day  Substance Use Topics   Alcohol use: Not  Currently    Comment: occasional       Family History :     Family History  Problem Relation Age of Onset   Hypertension Mother    Stroke Mother    Heart disease Father    Stroke Father    Hypertension Father       Home Medications:   Prior to Admission medications   Medication Sig Start Date End Date Taking? Authorizing Provider  amLODipine (NORVASC) 10 MG tablet Take 1 tablet (10 mg total) by mouth daily. 11/04/22  Yes Lindell Spar, MD  aspirin 81 MG EC tablet Take 1 tablet (81 mg total) by mouth daily. Swallow whole. 03/11/21  Yes Lindell Spar, MD  lisinopril (ZESTRIL) 20 MG tablet TAKE ONE TABLET BY MOUTH ONCE DAILY. 11/04/22  Yes Lindell Spar, MD  metFORMIN (GLUCOPHAGE) 500 MG tablet Take 1 tablet (500 mg total) by mouth 2 (two) times daily with a meal. 12/09/22  Yes Lindell Spar, MD  metoprolol tartrate (LOPRESSOR) 50 MG tablet TAKE (1) TABLET BY MOUTH TWICE DAILY. 09/25/22  Yes Lindell Spar, MD  atorvastatin (LIPITOR) 40 MG tablet Take 1 tablet (40 mg total) by mouth daily. Patient not taking: Reported on 12/18/2022 05/09/22   Lindell Spar, MD  Incontinence Supplies (MALE URINAL) MISC PRN 02/19/22   Lindell Spar, MD  UNABLE TO FIND Mechanical Lift Chair Dx: [phone] 07/05/21   Lindell Spar, MD  UNABLE TO FIND Transfer Bench/Bath Chair Dx: E99.37 07/05/21   Lindell Spar, MD     Allergies:     Allergies  Allergen Reactions   Bee Venom Swelling    Localized      Physical Exam:   Vitals  Blood pressure 116/77, pulse 97, temperature 98.2 F (36.8 C), temperature source Oral, resp. rate 10, height 6\' 1"  (1.854 m), weight 120.2 kg, SpO2 99 %.   1. General frail male,  lying in bed in NAD,   2. Normal affect and insight, Not Suicidal or Homicidal, Awake Alert, Oriented X 3.  3. No F.N deficits, ALL C.Nerves Intact, Strength 5/5 all 4 extremities, Sensation intact all 4 extremities, Plantars down going.  4. Ears and Eyes appear Normal,  Conjunctivae clear, PERRLA. Moist Oral Mucosa.  5. Supple Neck, No JVD, No cervical lymphadenopathy appriciated, No Carotid Bruits.  6. Symmetrical Chest wall movement, Good air movement bilaterally, CTAB.  7. RRR, No Gallops, Rubs or Murmurs, No Parasternal Heave.  8. Positive Bowel Sounds, Abdomen Soft, suprapubic fullness, No organomegaly appriciated,No rebound -guarding or rigidity.  9.  No Cyanosis, Normal Skin Turgor, No Skin Rash or Bruise.  10. Good muscle tone,  joints appear normal , no effusions, Normal ROM.     Data Review:    CBC Recent Labs  Lab 12/18/22 1244  WBC 8.5  HGB 11.3*  HCT 35.1*  PLT 224  MCV 76.6*  MCH 24.7*  MCHC 32.2  RDW 17.2*  LYMPHSABS 0.6*  MONOABS 1.4*  EOSABS 0.1  BASOSABS 0.1   ------------------------------------------------------------------------------------------------------------------  Chemistries  Recent Labs  Lab 12/18/22 1244  NA 136  K 4.1  CL 104  CO2 17*  GLUCOSE 84  BUN 103*  CREATININE 6.41*  CALCIUM 9.5  AST 21  ALT 12  ALKPHOS 83  BILITOT 0.4   ------------------------------------------------------------------------------------------------------------------ estimated creatinine clearance is 16.2 mL/min (A) (by C-G formula based on SCr of 6.41 mg/dL (H)). ------------------------------------------------------------------------------------------------------------------ No results for input(s): "TSH", "T4TOTAL", "T3FREE", "THYROIDAB" in the last 72 hours.  Invalid input(s): "FREET3"  Coagulation profile No results for input(s): "INR", "PROTIME" in the last 168 hours. ------------------------------------------------------------------------------------------------------------------- No results for input(s): "DDIMER" in the last 72 hours. -------------------------------------------------------------------------------------------------------------------  Cardiac Enzymes No results for input(s): "CKMB",  "TROPONINI", "MYOGLOBIN" in the last 168 hours.  Invalid input(s): "CK" ------------------------------------------------------------------------------------------------------------------ No results found for: "BNP"   ---------------------------------------------------------------------------------------------------------------  Urinalysis    Component Value Date/Time   COLORURINE YELLOW 12/18/2022 [place] 12/18/2022 1206   LABSPEC 1.011 12/18/2022 1206   PHURINE 5.0 12/18/2022 1206   GLUCOSEU NEGATIVE 12/18/2022 1206   HGBUR MODERATE (A) 12/18/2022 1206   BILIRUBINUR NEGATIVE 12/18/2022 1206   KETONESUR NEGATIVE 12/18/2022 1206   PROTEINUR 100 (A) 12/18/2022 1206   NITRITE NEGATIVE 12/18/2022 1206   LEUKOCYTESUR NEGATIVE 12/18/2022 1206    ----------------------------------------------------------------------------------------------------------------  Imaging Results:    DG Chest Portable 1 View  Result Date: 12/18/2022 CLINICAL DATA:  Weakness. EXAM: PORTABLE CHEST 1 VIEW COMPARISON:  None Available. FINDINGS: The heart size and mediastinal contours are within normal limits. Both lungs are clear. The visualized skeletal structures are unremarkable. IMPRESSION: No active disease. Electronically Signed   By: Marijo Conception M.D.   On: 12/18/2022 13:11   CT Head Wo Contrast  Result Date: 12/18/2022 CLINICAL DATA:  Mental status change, unknown cause. Weakness, confusion, and gait difficulty. EXAM: CT HEAD WITHOUT CONTRAST TECHNIQUE: Contiguous axial images were obtained from the base of the skull through the vertex without intravenous contrast. RADIATION DOSE REDUCTION: This exam was performed according to the departmental dose-optimization program which includes automated exposure control, adjustment of the mA and/or kV according to patient size and/or use of iterative reconstruction technique. COMPARISON:  Head CT 07/10/2020 and MRI 08/21/2020 FINDINGS: Brain:  There is no evidence of an acute infarct, intracranial hemorrhage, mass, midline shift, or extra-axial fluid collection. There is mild cerebral atrophy. Patchy hypodensities in the cerebral white matter bilaterally are unchanged and nonspecific but compatible with moderate chronic small vessel ischemic disease. Chronic lacunar infarcts are again noted in the pons and left basal ganglia. There is a partial empty sella. Vascular: Calcified atherosclerosis at the skull base. No hyperdense vessel. Skull: No acute fracture or suspicious osseous lesion. Sinuses/Orbits: No significant inflammatory changes in the included portions of the paranasal sinuses. Clear mastoid air cells. Unremarkable included orbits. Other: None. IMPRESSION: 1. No evidence of acute intracranial abnormality. 2. Moderate chronic small vessel ischemic disease. Electronically Signed   By: Logan Bores M.D.   On: 12/18/2022 13:07    EKG:  PR interval 162 ms QRS duration 145 ms QT/QTcB 418/489 ms P-R-T axes 184 266 174 Ectopic atrial rhythm RBBB and LAFB Abnormal T, consider ischemia, lateral leads   Assessment & Plan:    Principal Problem:   AKI (acute kidney injury) ([place]) Active Problems:   Essential hypertension   Hyperlipidemia   Type 2 diabetes mellitus with other specified complication ([place])   AKI Urinary retention -Baseline creatinine is less than 1, significantly elevated at 6.9 this admission due to urinary retention -Likely due to BPH, will start on Flomax. -Foley catheter inserted, with 2.2 L immediate output -continue with IV fluids, close BMP monitoring, -Avoid nephrotoxic medications  DM type 2 - Given AKI , will hold Metformin, and keep an insulin sliding scale  Hypertension -Blood Pressure is soft to acceptable, will hold meds for now, keep on IV fluids, will keep on prn IV hydralazine.  Hyperlipidemia - continue with statin   Generalized weakness - will consult PT.   DVT Prophylaxis Heparin -   Lovenox - SCDs   AM Labs Ordered, also please review Full Orders  Family Communication: Admission, patients condition and plan of care including tests being ordered have been discussed with the patient who indicate understanding and agree with the plan and Code Status.  Code Status Full  Likely DC to  home  Condition GUARDED    Consults called: none    Admission status: inpatient    Time spent in minutes : 70 minutes   Phillips Climes M.D on 12/18/2022 at [place] PM   [facility] - Office  [phone]

## 2022-12-18 NOTE — ED Notes (Signed)
F/C clamped by Dr. Waldron Labs after 2200 mL output with initial F/C insertion. Per MD, unclamp F/C after 1 hour and notify of amount drained after clamped.

## 2022-12-18 NOTE — ED Notes (Signed)
Attempted to call report on pt to floor. Per Network engineer, RN not ready for report yet. Will call when ready.

## 2022-12-18 NOTE — ED Provider Notes (Signed)
[facility] Provider Note   CSN: [id number] Arrival date & time: 12/18/22  1125     History  Chief Complaint  Patient presents with   Weakness    Isaiah Ellis is a 62 y.o. male.  HPI 62 year old male presents with weakness and trouble walking.  He estimates has been going on for about a week or so.  He has been having trouble getting to the bathroom.  He states he had a fall but does not think he had a significant injury when this all started.  He has chronic right-sided weakness (though he points to his left side) that he states seems unchanged.  However he is having difficulty walking nonetheless with a cane which is his typical way he walks.  He denies a headache, vomiting, chest pain, any pain whatsoever, or urinary symptoms.  Mostly he feels like he cannot get to the bathroom in time for his urine or bowels just because of the weakness/difficulty walking.  I discussed history with his friend Delois Good, who saw the today.  She last saw him last week.  Has been confused when she saw him today and there is evidence in his house of a couple holes in the wall where he has fallen presumably hit his head.  He is also been having some urinary incontinence for a while and trouble getting to the bathroom.  Home Medications Prior to Admission medications   Medication Sig Start Date End Date Taking? Authorizing Provider  amLODipine (NORVASC) 10 MG tablet Take 1 tablet (10 mg total) by mouth daily. 11/04/22  Yes Lindell Spar, MD  aspirin 81 MG EC tablet Take 1 tablet (81 mg total) by mouth daily. Swallow whole. 03/11/21  Yes Lindell Spar, MD  lisinopril (ZESTRIL) 20 MG tablet TAKE ONE TABLET BY MOUTH ONCE DAILY. 11/04/22  Yes Lindell Spar, MD  metFORMIN (GLUCOPHAGE) 500 MG tablet Take 1 tablet (500 mg total) by mouth 2 (two) times daily with a meal. 12/09/22  Yes Lindell Spar, MD  metoprolol tartrate (LOPRESSOR) 50 MG tablet TAKE (1)  TABLET BY MOUTH TWICE DAILY. 09/25/22  Yes Lindell Spar, MD  atorvastatin (LIPITOR) 40 MG tablet Take 1 tablet (40 mg total) by mouth daily. Patient not taking: Reported on 12/18/2022 05/09/22   Lindell Spar, MD  Incontinence Supplies (MALE URINAL) MISC PRN 02/19/22   Lindell Spar, MD  UNABLE TO FIND Mechanical Lift Chair Dx: [phone] 07/05/21   Lindell Spar, MD  UNABLE TO FIND Transfer Bench/Bath Chair Dx: [phone] 07/05/21   Lindell Spar, MD      Allergies    Bee venom    Review of Systems   Review of Systems  Constitutional:  Negative for fever.  Respiratory:  Negative for shortness of breath.   Cardiovascular:  Negative for chest pain.  Gastrointestinal:  Negative for abdominal pain.  Musculoskeletal:  Positive for gait problem. Negative for back pain.  Neurological:  Positive for weakness. Negative for headaches.    Physical Exam Updated Vital Signs BP 122/82   Pulse 97   Temp 98.2 F (36.8 C) (Oral)   Resp (!) 28   Ht 6\' 1"  (1.854 m)   Wt 120.2 kg   SpO2 99%   BMI 34.96 kg/m  Physical Exam Vitals and nursing note reviewed.  Constitutional:      General: He is not in acute distress.    Appearance: He is well-developed. He  is not ill-appearing or diaphoretic.  HENT:     Head: Normocephalic and atraumatic.  Eyes:     Extraocular Movements: Extraocular movements intact.     Pupils: Pupils are equal, round, and reactive to light.  Cardiovascular:     Rate and Rhythm: Normal rate and regular rhythm.     Heart sounds: Normal heart sounds.  Pulmonary:     Effort: Pulmonary effort is normal.     Breath sounds: Normal breath sounds.  Abdominal:     Palpations: Abdomen is soft.     Tenderness: There is no abdominal tenderness.  Skin:    General: Skin is warm and dry.  Neurological:     Mental Status: He is alert.     Comments: Patient is awake and alert and oriented to place and the month (thinks is the end of January rather than February 1) but disoriented to  the year.  When asked him later the similar questions he actually got them wrong as far as the month as well.  He has no slurred speech.  He seems to be generally weak but I do not detect any significant focal weakness in the arms or legs.     ED Results / Procedures / Treatments   Labs (all labs ordered are listed, but only abnormal results are displayed) Labs Reviewed  URINALYSIS, W/ REFLEX TO CULTURE (INFECTION SUSPECTED) - Abnormal; Notable for the following components:      Result Value   Hgb urine dipstick MODERATE (*)    Protein, ur 100 (*)    Bacteria, UA RARE (*)    All other components within normal limits  COMPREHENSIVE METABOLIC PANEL - Abnormal; Notable for the following components:   CO2 17 (*)    BUN 103 (*)    Creatinine, Ser 6.41 (*)    GFR, Estimated 9 (*)    All other components within normal limits  CBC WITH DIFFERENTIAL/PLATELET - Abnormal; Notable for the following components:   Hemoglobin 11.3 (*)    HCT 35.1 (*)    MCV 76.6 (*)    MCH 24.7 (*)    RDW 17.2 (*)    Lymphs Abs 0.6 (*)    Monocytes Absolute 1.4 (*)    All other components within normal limits  TROPONIN I (HIGH SENSITIVITY) - Abnormal; Notable for the following components:   Troponin I (High Sensitivity) 23 (*)    All other components within normal limits  RESP PANEL BY RT-PCR (RSV, FLU A&B, COVID)  RVPGX2  ETHANOL  CBG MONITORING, ED  TROPONIN I (HIGH SENSITIVITY)    EKG EKG Interpretation  Date/Time:  Thursday December 18 2022 14:19:54 EST Ventricular Rate:  82 PR Interval:  162 QRS Duration: 145 QT Interval:  418 QTC Calculation: 489 R Axis:   266 Text Interpretation: Ectopic atrial rhythm RBBB and LAFB Abnormal T, consider ischemia, lateral leads similar to Aug 2021 Confirmed by Sherwood Gambler [phone]) on 12/18/2022 3:34:10 PM  Radiology DG Chest Portable 1 View  Result Date: 12/18/2022 CLINICAL DATA:  Weakness. EXAM: PORTABLE CHEST 1 VIEW COMPARISON:  None Available. FINDINGS:  The heart size and mediastinal contours are within normal limits. Both lungs are clear. The visualized skeletal structures are unremarkable. IMPRESSION: No active disease. Electronically Signed   By: Marijo Conception M.D.   On: 12/18/2022 13:11   CT Head Wo Contrast  Result Date: 12/18/2022 CLINICAL DATA:  Mental status change, unknown cause. Weakness, confusion, and gait difficulty. EXAM: CT HEAD WITHOUT CONTRAST TECHNIQUE:  Contiguous axial images were obtained from the base of the skull through the vertex without intravenous contrast. RADIATION DOSE REDUCTION: This exam was performed according to the departmental dose-optimization program which includes automated exposure control, adjustment of the mA and/or kV according to patient size and/or use of iterative reconstruction technique. COMPARISON:  Head CT 07/10/2020 and MRI 08/21/2020 FINDINGS: Brain: There is no evidence of an acute infarct, intracranial hemorrhage, mass, midline shift, or extra-axial fluid collection. There is mild cerebral atrophy. Patchy hypodensities in the cerebral white matter bilaterally are unchanged and nonspecific but compatible with moderate chronic small vessel ischemic disease. Chronic lacunar infarcts are again noted in the pons and left basal ganglia. There is a partial empty sella. Vascular: Calcified atherosclerosis at the skull base. No hyperdense vessel. Skull: No acute fracture or suspicious osseous lesion. Sinuses/Orbits: No significant inflammatory changes in the included portions of the paranasal sinuses. Clear mastoid air cells. Unremarkable included orbits. Other: None. IMPRESSION: 1. No evidence of acute intracranial abnormality. 2. Moderate chronic small vessel ischemic disease. Electronically Signed   By: Logan Bores M.D.   On: 12/18/2022 13:07    Procedures Procedures    Medications Ordered in ED Medications  lactated ringers bolus 500 mL (500 mLs Intravenous New Bag/Given 12/18/22 1331)  lactated ringers  bolus 1,000 mL (1,000 mLs Intravenous New Bag/Given 12/18/22 1506)    ED Course/ Medical Decision Making/ A&P                             Medical Decision Making Amount and/or Complexity of Data Reviewed Labs: ordered.    Details: Acute kidney injury with BUN over 100.  Creatinine 6.4.  Troponin mildly elevated, probably is secondary to the creatinine rather than ACS. Radiology: ordered and independent interpretation performed.    Details: CT head without head bleed. ECG/medicine tests: independent interpretation performed.    Details: Unchanged from baseline.  Risk Decision regarding hospitalization.   Patient is found to have significant AKI, likely at least in part from urinary retention.  Has over 900 cc in his bladder on bladder scan.  Foley catheter will be placed.  He was also given IV fluids.  Fortunately no hyperkalemia or significant electrolyte disturbance.  However I think his BUN of 100 is contributing to his vague altered mental status.  He will need supportive care and trending of his creatinine now that he has a catheter.  Otherwise, discussed with Dr. Waldron Labs for admission.        Final Clinical Impression(s) / ED Diagnoses Final diagnoses:  AKI (acute kidney injury) ([place])  Acute urinary retention    Rx / DC Orders ED Discharge Orders     None         Sherwood Gambler, MD 12/18/22 1535

## 2022-12-18 NOTE — ED Triage Notes (Signed)
Pt brought in by ems for c/o some confusion and gait difficulties  Pt states he is having some episodes of urinary incontinence that has been going on x one week

## 2022-12-18 NOTE — ED Notes (Signed)
Pt transported to CT via stretcher

## 2022-12-19 DIAGNOSIS — N401 Enlarged prostate with lower urinary tract symptoms: Secondary | ICD-10-CM

## 2022-12-19 DIAGNOSIS — R338 Other retention of urine: Secondary | ICD-10-CM

## 2022-12-19 LAB — CBC
HCT: 35.7 % — ABNORMAL LOW (ref 39.0–52.0)
Hemoglobin: 11.9 g/dL — ABNORMAL LOW (ref 13.0–17.0)
MCH: 25 pg — ABNORMAL LOW (ref 26.0–34.0)
MCHC: 33.3 g/dL (ref 30.0–36.0)
MCV: 75 fL — ABNORMAL LOW (ref 80.0–100.0)
Platelets: 199 10*3/uL (ref 150–400)
RBC: 4.76 MIL/uL (ref 4.22–5.81)
RDW: 16.8 % — ABNORMAL HIGH (ref 11.5–15.5)
WBC: 7.4 10*3/uL (ref 4.0–10.5)
nRBC: 0 % (ref 0.0–0.2)

## 2022-12-19 LAB — BASIC METABOLIC PANEL
Anion gap: 9 (ref 5–15)
BUN: 90 mg/dL — ABNORMAL HIGH (ref 8–23)
CO2: 16 mmol/L — ABNORMAL LOW (ref 22–32)
Calcium: 8.8 mg/dL — ABNORMAL LOW (ref 8.9–10.3)
Chloride: 112 mmol/L — ABNORMAL HIGH (ref 98–111)
Creatinine, Ser: 4.73 mg/dL — ABNORMAL HIGH (ref 0.61–1.24)
GFR, Estimated: 13 mL/min — ABNORMAL LOW (ref >60)
Glucose, Bld: 94 mg/dL (ref 70–99)
Potassium: 3.7 mmol/L (ref 3.5–5.1)
Sodium: 137 mmol/L (ref 135–145)

## 2022-12-19 LAB — GLUCOSE, CAPILLARY
Glucose-Capillary: 91 mg/dL (ref 70–99)
Glucose-Capillary: 100 mg/dL — ABNORMAL HIGH (ref 70–99)
Glucose-Capillary: 110 mg/dL — ABNORMAL HIGH (ref 70–99)
Glucose-Capillary: 101 mg/dL — ABNORMAL HIGH (ref 70–99)

## 2022-12-19 LAB — MAGNESIUM: Magnesium: 1.8 mg/dL (ref 1.7–2.4)

## 2022-12-19 MED ORDER — CHLORHEXIDINE GLUCONATE CLOTH 2 % EX PADS
6.0000 | MEDICATED_PAD | Freq: Every day | CUTANEOUS | Status: DC
  Administered 2022-12-19 – 2022-12-22 (×4): 6 via TOPICAL

## 2022-12-19 MED ORDER — AMLODIPINE BESYLATE 5 MG PO TABS
5.0000 mg | ORAL_TABLET | Freq: Every day | ORAL | Status: DC
  Administered 2022-12-19: 5 mg via ORAL
  Filled 2022-12-19 (×2): qty 1

## 2022-12-19 NOTE — TOC Initial Note (Signed)
Transition of Care [facility]) - Initial/Assessment Note    Patient Details  Name: Isaiah Ellis MRN: [id number] Date of Birth: 1961/06/07  Transition of Care [facility]) CM/SW Contact:    Shade Flood, LCSW Phone Number: 12/19/2022, 3:18 PM  Clinical Narrative:                  Pt admitted from home. PT recommending SNF rehab at dc. Met with pt at bedside to assess and review dc planning. Pt agreeable to SNF rehab at dc. CMS provider options reviewed. Will refer as requested. DC EDD not yet known. Will start auth as appropriate.   TOC to follow.   Expected Discharge Plan: Skilled Nursing Facility Barriers to Discharge: Continued Medical Work up   Patient Goals and CMS Choice Patient states their goals for this hospitalization and ongoing recovery are:: rehab CMS Medicare.gov Compare Post Acute Care list provided to:: Patient Choice offered to / list presented to : Patient [place] ownership interest in [facility].provided to:: Patient    Expected Discharge Plan and Services In-house Referral: Clinical Social Work   Post Acute Care Choice: [place] Living arrangements for the past 2 months: Apartment                                      Prior Living Arrangements/Services Living arrangements for the past 2 months: Apartment Lives with:: Self Patient language and need for interpreter reviewed:: Yes Do you feel safe going back to the place where you live?: Yes      Need for Family Participation in Patient Care: No (Comment)     Criminal Activity/Legal Involvement Pertinent to Current Situation/Hospitalization: No - Comment as needed  Activities of Daily Living Home Assistive Devices/Equipment: Cane (specify quad or straight), Walker (specify type) ADL Screening (condition at time of admission) Patient's cognitive ability adequate to safely complete daily activities?: Yes Is the patient deaf or have difficulty hearing?: No Does the patient  have difficulty seeing, even when wearing glasses/contacts?: No Does the patient have difficulty concentrating, remembering, or making decisions?: No Patient able to express need for assistance with ADLs?: Yes Does the patient have difficulty dressing or bathing?: Yes Independently performs ADLs?: No Communication: Independent Dressing (OT): Needs assistance Is this a change from baseline?: Pre-admission baseline Grooming: Needs assistance Is this a change from baseline?: Pre-admission baseline Feeding: Needs assistance Is this a change from baseline?: Pre-admission baseline Bathing: Needs assistance Is this a change from baseline?: Pre-admission baseline Toileting: Dependent Is this a change from baseline?: Pre-admission baseline In/Out Bed: Needs assistance Is this a change from baseline?: Pre-admission baseline Walks in Home: Needs assistance Is this a change from baseline?: Pre-admission baseline Does the patient have difficulty walking or climbing stairs?: Yes Weakness of Legs: Both Weakness of Arms/Hands: Both  Permission Sought/Granted Permission sought to share information with : Chartered certified accountant granted to share information with : Yes, Verbal Permission Granted     Permission granted to share info w AGENCY: [facility]        Emotional Assessment   Attitude/Demeanor/Rapport: Engaged Affect (typically observed): Pleasant Orientation: : Oriented to Self, Oriented to Place, Oriented to  Time, Oriented to Situation Alcohol / Substance Use: Not Applicable Psych Involvement: No (comment)  Admission diagnosis:  AKI (acute kidney injury) ([place]) [N17.9] Acute urinary retention [R33.8] Patient Active Problem List   Diagnosis Date Noted   AKI (acute  kidney injury) ([place]) 12/18/2022   Syncope 09/25/2022   Encounter for general adult medical examination with abnormal findings 11/21/2021   Prostate cancer screening 11/21/2021   Vitamin D deficiency  03/12/2021   History of CVA (cerebrovascular accident) 03/11/2021   Spinal stenosis of lumbar region 09/18/2020   Hyperlipidemia 05/18/2019   Type 2 diabetes mellitus with other specified complication ([place]) 46/27/0350   Tobacco use disorder 05/18/2019   Essential hypertension 03/28/2019   PCP:  Lindell Spar, MD Pharmacy:   [place], [place] [address] [address] Phone: [phone] Fax: [phone] - [place], [place] [address] Phone: [phone] Fax: [phone]     Social Determinants of Health (SDOH) Social History: [place]: No Food Insecurity (12/18/2022)  Housing: Low Risk  (12/18/2022)  Transportation Needs: No Transportation Needs (12/18/2022)  Utilities: Not At Risk (12/18/2022)  Depression (PHQ2-9): Low Risk  (09/25/2022)  Tobacco Use: High Risk (12/18/2022)   SDOH Interventions:     Readmission Risk Interventions    12/19/2022    3:15 PM  Readmission Risk Prevention Plan  Medication Screening Complete  Transportation Screening Complete

## 2022-12-19 NOTE — Evaluation (Signed)
Physical Therapy Evaluation Patient Details Name: Isaiah Ellis MRN: [id number] DOB: December 20, 1960 Today's Date: 12/19/2022  History of Present Illness  Isaiah Ellis  is a 62 y.o. male, with past medical history of diabetes mellitus, hypertension, hyperlipidemia, he presents to ED secondary to complaint generalized weakness, fatigue and trouble walking, he says he had a fall, but he did not have a significant injury, he has chronic right-sided weakness, this is status seems unchanged, by himself, with a visiting nurse, he denies headache, nausea, vomiting, chest pain, he does report difficulty urinating,.    Clinical Impression  Patient limited for functional mobility as stated below secondary to BLE weakness, fatigue and poor standing balance.  Patient able to transition to seated EOB without assist. He demonstrates good sitting balance and sitting tolerance. He requires min assist to transfer to standing from bed and min/mod assist to transfer to standing from toilet secondary to bilateral LE weakness. He ambulates with slow, labored cadence with RW and appears to be at an increased risk for falls. Patient assisted back to bed at end of session and is limited by fatigue. Patient will benefit from continued physical therapy in hospital and recommended venue below to increase strength, balance, endurance for safe ADLs and gait.        Recommendations for follow up therapy are one component of a multi-disciplinary discharge planning process, led by the attending physician.  Recommendations may be updated based on patient status, additional functional criteria and insurance authorization.  Follow Up Recommendations Skilled nursing-short term rehab (<3 hours/day) Can patient physically be transported by private vehicle: Yes    Assistance Recommended at Discharge Frequent or constant Supervision/Assistance  Patient can return home with the following  A little help with walking and/or transfers;A little  help with bathing/dressing/bathroom;Assistance with cooking/housework    Equipment Recommendations None recommended by PT  Recommendations for Other Services       Functional Status Assessment Patient has had a recent decline in their functional status and demonstrates the ability to make significant improvements in function in a reasonable and predictable amount of time.     Precautions / Restrictions Precautions Precautions: Fall Restrictions Weight Bearing Restrictions: No      Mobility  Bed Mobility Overal bed mobility: Modified Independent                  Transfers Overall transfer level: Needs assistance Equipment used: Rolling walker (2 wheels) Transfers: Sit to/from Stand, Bed to chair/wheelchair/BSC Sit to Stand: Min assist, Mod assist   Step pivot transfers: Min assist       General transfer comment: assist to power up to standing with RW, min a from bed, min/mod from toilet    Ambulation/Gait Ambulation/Gait assistance: Min assist Gait Distance (Feet): 15 Feet Assistive device: Rolling walker (2 wheels) Gait Pattern/deviations: Step-to pattern Gait velocity: decreased     General Gait Details: slow, labored, unsteady cadence with RW  Stairs            Wheelchair Mobility    Modified Rankin (Stroke Patients Only)       Balance Overall balance assessment: Needs assistance Sitting-balance support: Feet supported Sitting balance-Leahy Scale: Good Sitting balance - Comments: seated EOB   Standing balance support: Bilateral upper extremity supported, Reliant on assistive device for balance Standing balance-Leahy Scale: Fair                               Pertinent  Vitals/Pain Pain Assessment Pain Assessment: No/denies pain    Home Living Family/patient expects to be discharged to:: Private residence Living Arrangements: Alone Available Help at Discharge: Personal care attendant;Available PRN/intermittently Type of  Home: House Home Access: Level entry       Home Layout: One level        Prior Function Prior Level of Function : Needs assist             Mobility Comments: states household ambulation with SPC ADLs Comments: independent     Hand Dominance        Extremity/Trunk Assessment   Upper Extremity Assessment Upper Extremity Assessment: Generalized weakness    Lower Extremity Assessment Lower Extremity Assessment: Generalized weakness    Cervical / Trunk Assessment Cervical / Trunk Assessment: Normal  Communication   Communication: No difficulties  Cognition Arousal/Alertness: Awake/alert Behavior During Therapy: WFL for tasks assessed/performed, Flat affect Overall Cognitive Status: No family/caregiver present to determine baseline cognitive functioning                                          General Comments      Exercises     Assessment/Plan    PT Assessment Patient needs continued PT services  PT Problem List Decreased strength;Decreased activity tolerance;Decreased balance;Decreased mobility       PT Treatment Interventions DME instruction;Therapeutic exercise;Gait training;Balance training;Stair training;Neuromuscular re-education;Functional mobility training;Therapeutic activities;Patient/family education    PT Goals (Current goals can be found in the Care Plan section)  Acute Rehab PT Goals Patient Stated Goal: return home PT Goal Formulation: With patient Time For Goal Achievement: 01/02/23 Potential to Achieve Goals: Good    Frequency Min 3X/week     Co-evaluation               AM-PAC PT "6 Clicks" Mobility  Outcome Measure Help needed turning from your back to your side while in a flat bed without using bedrails?: A Little Help needed moving from lying on your back to sitting on the side of a flat bed without using bedrails?: A Little Help needed moving to and from a bed to a chair (including a wheelchair)?: A  Lot Help needed standing up from a chair using your arms (e.g., wheelchair or bedside chair)?: A Lot Help needed to walk in hospital room?: A Lot Help needed climbing 3-5 steps with a railing? : A Lot 6 Click Score: 14    End of Session Equipment Utilized During Treatment: Gait belt Activity Tolerance: Patient tolerated treatment well;Patient limited by fatigue Patient left: in bed;with call bell/phone within reach;with bed alarm set Nurse Communication: Mobility status PT Visit Diagnosis: Unsteadiness on feet (R26.81);Other abnormalities of gait and mobility (R26.89);Muscle weakness (generalized) (M62.81)    Time: 6433-2951 PT Time Calculation (min) (ACUTE ONLY): 19 min   Charges:   PT Evaluation $PT Eval Low Complexity: 1 Low PT Treatments $Therapeutic Activity: 8-22 mins        1:10 PM, 12/19/22 Mearl Latin PT, DPT Physical Therapist at [facility]

## 2022-12-19 NOTE — Plan of Care (Signed)
  Problem: Acute Rehab PT Goals(only PT should resolve) Goal: Patient Will Transfer Sit To/From Stand Outcome: Progressing Flowsheets (Taken 12/19/2022 1318) Patient will transfer sit to/from stand: with min guard assist Goal: Pt Will Transfer Bed To Chair/Chair To Bed Outcome: Progressing Flowsheets (Taken 12/19/2022 1318) Pt will Transfer Bed to Chair/Chair to Bed: min guard assist Goal: Pt Will Perform Standing Balance Or Pre-Gait Outcome: Progressing Flowsheets (Taken 12/19/2022 1318) Pt will perform standing balance or pre-gait: with Supervision Goal: Pt/caregiver will Perform Home Exercise Program Outcome: Progressing Flowsheets (Taken 12/19/2022 1318) Pt/caregiver will Perform Home Exercise Program:  For increased strengthening  For improved balance  Independently  1:18 PM, 12/19/22 Mearl Latin PT, DPT Physical Therapist at [facility]

## 2022-12-19 NOTE — Progress Notes (Signed)
Progress Note   Patient: Isaiah Ellis TFT:[id number] DOB: [birth year] DOA: 12/18/2022     1 DOS: the patient was seen and examined on 12/19/2022   Brief hospital course: As per H&P written by Dr. Waldron Labs on 12/18/2022  Isaiah Ellis  is a 62 y.o. male, with past medical history of diabetes mellitus, hypertension, hyperlipidemia, he presents to ED secondary to complaint generalized weakness, fatigue and trouble walking, he says he had a fall, but he did not have a significant injury, he has chronic right-sided weakness, this is status seems unchanged, by himself, with a visiting nurse, he denies headache, nausea, vomiting, chest pain, he does report difficulty urinating,. -In ED his creatinine was noticed to be elevated at 6.9, from baseline of 1, blood pressure was soft, bladder scan was significant for> 9 9, Foley catheter was inserted with immediate 2.2 urine output (there was more urine but it had foley was clamped temporarily) [facility] hospitalist consulted to admit.  Assessment and Plan: 1-acute renal failure -In the setting of obstructive uropathy -Enlarged prostate appreciated on CT scan. -Patient with over 2 L urine produced after Foley insertion -Renal function adequately responding and downtrending. -Continue minimizing nephrotoxic agents -Maintain adequate hydration -Avoid the use of contrast and hypotension -Follow renal function trend -Continue treatment with Flomax.  2-HTN -Stable overall -Continue holding home antihypertensive agent -Continue to follow vital signs  3-type 2 diabetes -Continue sliding scale insulin -Continue holding oral hypoglycemic agent -Recent A1c 5.7 (November 2023) -To follow CBGs fluctuation.  4-lipidemia -Continue statin  5-generalized weakness -Most likely associated with acute renal failure, dehydration and deconditioning -Physical therapy has evaluated patient with recommendation for a skilled nursing facility placement for short-term rehab at  discharge. -TOC aware and helping with placement.  6-class I obesity -Low-calorie diet, portion control and increase physical activity discussed with patient. -Body mass index is 70.6 kg/m.  7-metabolic acidosis -In the setting of renal failure -Continue to maintain adequate hydration and continue to follow electrolytes and renal function trend -depending further improvement will decide the need for sodium bicarbonate.  Subjective:  Expressing generalized weakness; no chest pain or shortness of breath.  Feeling slightly better.  Foley catheter in place.  Physical Exam: Vitals:   12/18/22 1639 12/18/22 2247 12/19/22 0524 12/19/22 0941  BP: (!) 142/98 113/84 126/85 107/82  Pulse: 91 76 70 74  Resp: 19 18 18    Temp: 98.1 F (36.7 C) 98.3 F (36.8 C) 97.9 F (36.6 C)   TempSrc: Oral  Oral   SpO2: 100% 100% 100%   Weight: 105.9 kg     Height: 6\' 1"  (1.854 m)      General exam: Alert, awake, oriented x 3; expressing generalized weakness and just not feeling well.  No chest pain, no nausea, no vomiting, no shortness of breath. Respiratory system: Clear to auscultation. Respiratory effort normal.  Good saturation on room air. Cardiovascular system:RRR. No rubs or gallops; no JVD. Gastrointestinal system: Abdomen is obese, nondistended, soft and nontender. No organomegaly or masses felt. Normal bowel sounds heard. Central nervous system: Alert and oriented. No focal neurological deficits. Extremities: No cyanosis or clubbing; no edema. Skin: No petechiae. Psychiatry: Judgement and insight appear normal. Mood & affect appropriate.   Data Reviewed: Basic metabolic panel: Sodium 237, potassium 3.7, chloride 112, bicarb 16, BUN 90, creatinine 4.73 and GFR 13. Magnesium: 1.8 CBC: WBC 7.4, hemoglobin 11.9 and platelet count 199 K CBGs: 91>> 100>>110  Family Communication: No family at bedside.  Disposition: Status is: Inpatient  Remains inpatient appropriate because: Continue  treatment of acute renal failure in the setting of obstructive uropathy; renal function is still not back to baseline.  Patient also demonstrating generalized weakness and deconditioning that will require for safety discharge pathway, short-term rehabilitation at a skilled nursing facility when medically stable.   Planned Discharge Destination: Skilled nursing facility  Time spent: 50 minutes  Author: Barton Dubois, MD 12/19/2022 4:42 PM  For on call review [email or web address].

## 2022-12-19 NOTE — NC FL2 (Signed)
[place] LEVEL OF CARE FORM     IDENTIFICATION  Patient Name: Isaiah Ellis Birthdate: [birth year] Sex: male Admission Date (Current Location): 12/18/2022  [facility] and [place] Number:  [employer] and Address:  [place] [address] [place]      Provider Number: [id number]  Attending Physician Name and Address:  Barton Dubois, MD  Relative Name and Phone Number:       Current Level of Care: Hospital Recommended Level of Care: [place] Prior Approval Number:    Date Approved/Denied:   PASRR Number: [id number]  Discharge Plan: SNF    Current Diagnoses: Patient Active Problem List   Diagnosis Date Noted   AKI (acute kidney injury) ([place]) 12/18/2022   Syncope 09/25/2022   Encounter for general adult medical examination with abnormal findings 11/21/2021   Prostate cancer screening 11/21/2021   Vitamin D deficiency 03/12/2021   History of CVA (cerebrovascular accident) 03/11/2021   Spinal stenosis of lumbar region 09/18/2020   Hyperlipidemia 05/18/2019   Type 2 diabetes mellitus with other specified complication ([place]) 89/21/1941   Tobacco use disorder 05/18/2019   Essential hypertension 03/28/2019    Orientation RESPIRATION BLADDER Height & Weight     Self, Time, Situation, Place  Normal Continent Weight: 233 lb 7.5 oz (105.9 kg) Height:  6\' 1"  (185.4 cm)  BEHAVIORAL SYMPTOMS/MOOD NEUROLOGICAL BOWEL NUTRITION STATUS      Continent Diet (see dc summary)  AMBULATORY STATUS COMMUNICATION OF NEEDS Skin   Extensive Assist Verbally Normal                       Personal Care Assistance Level of Assistance  Bathing, Feeding, Dressing Bathing Assistance: Limited assistance Feeding assistance: Independent Dressing Assistance: Limited assistance     Functional Limitations Info  Sight, Hearing, Speech Sight Info: Impaired Hearing Info: Adequate Speech Info: Adequate    SPECIAL CARE FACTORS  FREQUENCY  PT (By licensed PT), OT (By licensed OT)     PT Frequency: 5x week OT Frequency: 3x week            Contractures Contractures Info: Not present    Additional Factors Info  Code Status, Allergies Code Status Info: Full Allergies Info: Bee Venom           Current Medications (12/19/2022):  This is the current hospital active medication list Current Facility-Administered Medications  Medication Dose Route Frequency Provider Last Rate Last Admin   0.9 %  sodium chloride infusion   Intravenous Continuous Elgergawy, Silver Huguenin, MD 100 mL/hr at 12/18/22 2037 New Bag at 12/18/22 2037   acetaminophen (TYLENOL) tablet 650 mg  650 mg Oral Q6H PRN Elgergawy, Silver Huguenin, MD       Or   acetaminophen (TYLENOL) suppository 650 mg  650 mg Rectal Q6H PRN Elgergawy, Silver Huguenin, MD       amLODipine (NORVASC) tablet 5 mg  5 mg Oral Daily Barton Dubois, MD   5 mg at 12/19/22 0941   aspirin EC tablet 81 mg  81 mg Oral Daily Elgergawy, Silver Huguenin, MD   81 mg at 12/19/22 0941   Chlorhexidine Gluconate Cloth 2 % PADS 6 each  6 each Topical Daily Barton Dubois, MD   6 each at 12/19/22 1007   heparin injection 5,000 Units  5,000 Units Subcutaneous Q8H Elgergawy, Silver Huguenin, MD   5,000 Units at 12/19/22 1450   insulin aspart (novoLOG) injection 0-5 Units  0-5  Units Subcutaneous QHS Elgergawy, Silver Huguenin, MD       insulin aspart (novoLOG) injection 0-9 Units  0-9 Units Subcutaneous TID WC Elgergawy, Silver Huguenin, MD       metoprolol tartrate (LOPRESSOR) tablet 25 mg  25 mg Oral BID Elgergawy, Silver Huguenin, MD   25 mg at 12/19/22 0941   tamsulosin (FLOMAX) capsule 0.4 mg  0.4 mg Oral QPC supper Elgergawy, Silver Huguenin, MD   0.4 mg at 12/18/22 3559     Discharge Medications: Please see discharge summary for a list of discharge medications.  Relevant Imaging Results:  Relevant Lab Results:   Additional Information SSN: [id number], [place]

## 2022-12-20 LAB — BASIC METABOLIC PANEL
Anion gap: 8 (ref 5–15)
BUN: 62 mg/dL — ABNORMAL HIGH (ref 8–23)
CO2: 19 mmol/L — ABNORMAL LOW (ref 22–32)
Calcium: 8.5 mg/dL — ABNORMAL LOW (ref 8.9–10.3)
Chloride: 114 mmol/L — ABNORMAL HIGH (ref 98–111)
Creatinine, Ser: 2.83 mg/dL — ABNORMAL HIGH (ref 0.61–1.24)
GFR, Estimated: 24 mL/min — ABNORMAL LOW (ref >60)
Glucose, Bld: 92 mg/dL (ref 70–99)
Potassium: 3.7 mmol/L (ref 3.5–5.1)
Sodium: 141 mmol/L (ref 135–145)

## 2022-12-20 LAB — GLUCOSE, CAPILLARY
Glucose-Capillary: 95 mg/dL (ref 70–99)
Glucose-Capillary: 110 mg/dL — ABNORMAL HIGH (ref 70–99)
Glucose-Capillary: 117 mg/dL — ABNORMAL HIGH (ref 70–99)
Glucose-Capillary: 100 mg/dL — ABNORMAL HIGH (ref 70–99)

## 2022-12-20 NOTE — TOC Progression Note (Signed)
Transition of Care [facility]) - Progression Note    Patient Details  Name: ERIKA HUSSAR MRN: [id number] Date of Birth: 11/16/1961  Transition of Care [facility]) CM/SW [place], [place] Phone Number: 12/20/2022, 9:29 AM  Clinical Narrative:     Josem Kaufmann is approved starting on 2/5-27, choice needs to be updated to [facility] before pt dc. Auth approval number [id number].  Expected Discharge Plan: [place] Barriers to Discharge: Continued Medical Work up  Expected Discharge Plan and Services In-house Referral: Clinical Social Work   Post Acute Care Choice: [place] Living arrangements for the past 2 months: Apartment                                       Social Determinants of Health (SDOH) Interventions SDOH Screenings   Food Insecurity: No Food Insecurity (12/18/2022)  Housing: Low Risk  (12/18/2022)  Transportation Needs: No Transportation Needs (12/18/2022)  Utilities: Not At Risk (12/18/2022)  Depression (PHQ2-9): Low Risk  (09/25/2022)  Tobacco Use: High Risk (12/18/2022)    Readmission Risk Interventions    12/19/2022    3:15 PM  Readmission Risk Prevention Plan  Medication Screening Complete  Transportation Screening Complete

## 2022-12-20 NOTE — Progress Notes (Signed)
Progress Note   Patient: Isaiah Ellis SHF:[id number] DOB: August 03, 1961 DOA: 12/18/2022     2 DOS: the patient was seen and examined on 12/20/2022   Brief hospital course: As per H&P written by Dr. Waldron Labs on 12/18/2022  Isaiah Ellis  is a 62 y.o. male, with past medical history of diabetes mellitus, hypertension, hyperlipidemia, he presents to ED secondary to complaint generalized weakness, fatigue and trouble walking, he says he had a fall, but he did not have a significant injury, he has chronic right-sided weakness, this is status seems unchanged, by himself, with a visiting nurse, he denies headache, nausea, vomiting, chest pain, he does report difficulty urinating,. -In ED his creatinine was noticed to be elevated at 6.9, from baseline of 1, blood pressure was soft, bladder scan was significant for> 9 9, Foley catheter was inserted with immediate 2.2 urine output (there was more urine but it had foley was clamped temporarily) [facility] hospitalist consulted to admit.  Assessment and Plan: 1-acute renal failure -In the setting of obstructive uropathy -Enlarged prostate appreciated on CT scan. -Patient with over 2 L urine produced after Foley insertion -Renal function adequately responding and downtrending. -Continue minimizing nephrotoxic agents -Continue to maintain adequate hydration -Continue to avoid the use of contrast and hypotension -Continue to follow renal function trend -Continue treatment with Flomax.  2-HTN -Stable overall -Continue using metoprolol. -Continue hold the rest of patient antihypertensive agents. -Continue to follow vital signs  3-type 2 diabetes -Continue sliding scale insulin -Continue holding oral hypoglycemic agent -Recent A1c 5.7 (November 2023) -To follow CBGs fluctuation.  4-lipidemia -Continue statin -Healthy diet discussed with patient.  5-generalized weakness -Most likely associated with acute renal failure, dehydration and  deconditioning -Physical therapy has evaluated patient with recommendation for a skilled nursing facility placement for short-term rehab at discharge. -TOC aware and helping with placement.  6-class I obesity -Low-calorie diet, portion control and increase physical activity discussed with patient. -Body mass index is 50.27 kg/m.  7-metabolic acidosis -In the setting of renal failure -Continue to maintain adequate hydration and continue to follow electrolytes and renal function trend -Patient bicarbonate up to 19; continue monitoring.  Subjective:  Foley catheter in place; reporting no chest pain, no nausea, no vomiting, no fever.  Oriented x 3 and following commands appropriately.  Feeling weak and deconditioned.  Renal function improving.  Physical Exam: Vitals:   12/19/22 2057 12/20/22 0409 12/20/22 0542 12/20/22 0815  BP: 112/86 108/81  109/76  Pulse: 75 62  67  Resp: 20 19  18   Temp: 98.3 F (36.8 C) 98.2 F (36.8 C)  97.9 F (36.6 C)  TempSrc: Oral Oral  Oral  SpO2: 100% 100%  100%  Weight:   107.1 kg   Height:       General exam: Alert, awake, oriented x 3; reports feeling better and having improvement in his overall mentation and feelings.  No chest pain, no nausea, no vomiting. Respiratory system: Clear to auscultation. Respiratory effort normal.  Good saturation on room air. Cardiovascular system:RRR. No rubs or gallops; no JVD. Gastrointestinal system: Abdomen is nondistended, soft and nontender. No organomegaly or masses felt. Normal bowel sounds heard. Central nervous system: Alert and oriented. No focal neurological deficits. Extremities: No cyanosis or clubbing. Skin: No petechiae. Psychiatry: Judgement and insight appear normal. Mood & affect appropriate.   Data Reviewed: Basic metabolic panel: Sodium 741, potassium 3.7, chloride 114, bicarb 19, BUN 62, creatinine 2.83 and GFR 24.  Family Communication: No family at bedside.  Disposition: Status is:  Inpatient Remains inpatient appropriate because: Continue treatment of acute renal failure in the setting of obstructive uropathy; renal function is still not back to baseline.  Patient also demonstrating generalized weakness and deconditioning that will require for safety discharge pathway, short-term rehabilitation at a skilled nursing facility when medically stable.   Planned Discharge Destination: Skilled nursing facility  Time spent: 35 minutes  Author: Barton Dubois, MD 12/20/2022 11:39 AM  For on call review [email or web address].

## 2022-12-20 NOTE — TOC Progression Note (Signed)
Transition of Care [facility]) - Progression Note    Patient Details  Name: NORMAL RECINOS MRN: [id number] Date of Birth: 1961/06/09  Transition of Care [facility]) CM/SW [place], [place] Phone Number: 12/20/2022, 4:14 PM  Clinical Narrative:     CSW spoke with pt via phone concerning bed offers, pt states he wants to go home and passed the phone to his friend Deloris Good, she states she is his emergency contact. CSW went off the bed offers with Ms. Kermit Balo and explained PT recommendations, home vs. STR, she states pt has a nurse daily from 10-2 but that would be it. Ms. Kermit Balo states she will speak with pt and request CSW to follow up with her tomorrow. Bed offers and Medicare.gov ratings list provided via text message, CSW will follow up with Ms. Kermit Balo tomorrow.    Expected Discharge Plan: [place] Barriers to Discharge: Continued Medical Work up  Expected Discharge Plan and Services In-house Referral: Clinical Social Work   Post Acute Care Choice: [place] Living arrangements for the past 2 months: Apartment                                       Social Determinants of Health (SDOH) Interventions SDOH Screenings   Food Insecurity: No Food Insecurity (12/18/2022)  Housing: Low Risk  (12/18/2022)  Transportation Needs: No Transportation Needs (12/18/2022)  Utilities: Not At Risk (12/18/2022)  Depression (PHQ2-9): Low Risk  (09/25/2022)  Tobacco Use: High Risk (12/18/2022)    Readmission Risk Interventions    12/19/2022    3:15 PM  Readmission Risk Prevention Plan  Medication Screening Complete  Transportation Screening Complete

## 2022-12-20 NOTE — Progress Notes (Signed)
Pt slept through the night, vitals stable. Pt A&O x 3, disoriented to situation. Pt is confused at times. Red urine output assessed in drainage bag.

## 2022-12-21 LAB — GLUCOSE, CAPILLARY
Glucose-Capillary: 91 mg/dL (ref 70–99)
Glucose-Capillary: 107 mg/dL — ABNORMAL HIGH (ref 70–99)
Glucose-Capillary: 106 mg/dL — ABNORMAL HIGH (ref 70–99)
Glucose-Capillary: 115 mg/dL — ABNORMAL HIGH (ref 70–99)

## 2022-12-21 LAB — BASIC METABOLIC PANEL
Anion gap: 10 (ref 5–15)
BUN: 43 mg/dL — ABNORMAL HIGH (ref 8–23)
CO2: 16 mmol/L — ABNORMAL LOW (ref 22–32)
Calcium: 8 mg/dL — ABNORMAL LOW (ref 8.9–10.3)
Chloride: 113 mmol/L — ABNORMAL HIGH (ref 98–111)
Creatinine, Ser: 1.95 mg/dL — ABNORMAL HIGH (ref 0.61–1.24)
GFR, Estimated: 38 mL/min — ABNORMAL LOW (ref >60)
Glucose, Bld: 80 mg/dL (ref 70–99)
Potassium: 3.5 mmol/L (ref 3.5–5.1)
Sodium: 139 mmol/L (ref 135–145)

## 2022-12-21 MED ORDER — SODIUM BICARBONATE 650 MG PO TABS
650.0000 mg | ORAL_TABLET | Freq: Three times a day (TID) | ORAL | Status: DC
  Administered 2022-12-21 – 2022-12-22 (×4): 650 mg via ORAL
  Filled 2022-12-21 (×5): qty 1

## 2022-12-21 NOTE — Progress Notes (Signed)
Progress Note   Patient: Isaiah Ellis [id number]:[id number] DOB: 07-Jun-1961 DOA: 12/18/2022     3 DOS: the patient was seen and examined on 12/21/2022   Brief hospital course: As per H&P written by Dr. Waldron Labs on 12/18/2022  [name]  is a 62 y.o. male, with past medical history of diabetes mellitus, hypertension, hyperlipidemia, he presents to ED secondary to complaint generalized weakness, fatigue and trouble walking, he says he had a fall, but he did not have a significant injury, he has chronic right-sided weakness, this is status seems unchanged, by himself, with a visiting nurse, he denies headache, nausea, vomiting, chest pain, he does report difficulty urinating,. -In ED his creatinine was noticed to be elevated at 6.9, from baseline of 1, blood pressure was soft, bladder scan was significant for> 9 9, Foley catheter was inserted with immediate 2.2 urine output (there was more urine but it had foley was clamped temporarily) [facility] hospitalist consulted to admit.  Assessment and Plan: 1-acute renal failure -In the setting of obstructive uropathy -Enlarged prostate appreciated on CT scan. -Patient with over 2 L urine produced after Foley insertion -Renal function adequately responding and downtrending. -Continue minimizing nephrotoxic agents -Continue to maintain adequate hydration -Continue to avoid the use of contrast and hypotension -Continue to follow renal function trend -Continue treatment with Flomax and attempt Foley catheter removal.. -Creatinine down to 1.95  2-HTN -Stable overall -Continue using metoprolol. -Continue hold the rest of patient antihypertensive agents. -Continue to follow vital signs  3-type 2 diabetes -Continue sliding scale insulin -Continue holding oral hypoglycemic agent -Recent A1c 5.7 (November 2023) -To follow CBGs fluctuation.  4-lipidemia -Continue statin -Healthy diet discussed with patient.  5-generalized weakness -Most likely associated  with acute renal failure, dehydration and deconditioning -Physical therapy has evaluated patient with recommendation for a skilled nursing facility placement for short-term rehab at discharge. -TOC aware and helping with placement.  6-class I obesity -Low-calorie diet, portion control and increase physical activity discussed with patient. -Body mass index is 06.23 kg/m.  7-metabolic acidosis -In the setting of renal failure -Continue to maintain adequate hydration and continue to follow electrolytes and renal function trend -Patient bicarbonate around 16 this morning; low sodium bicarbonate tablets will be provided. -Continue monitoring.  Subjective:  No overnight events; patient reports no chest pain, no nausea, no vomiting.  No dysuria.  Physical Exam: Vitals:   12/20/22 0815 12/20/22 1511 12/20/22 2044 12/21/22 0555  BP: 109/76 113/74 109/77 104/70  Pulse: 67 66 67 70  Resp: 18 20 (!) 21 20  Temp: 97.9 F (36.6 C) 98.4 F (36.9 C) 98.6 F (37 C) 99.3 F (37.4 C)  TempSrc: Oral Oral Oral Oral  SpO2: 100% 100% 100% 96%  Weight:      Height:       General exam: Alert, awake, oriented x 3; no overnight events.  Patient denies chest pain, nausea, vomiting, shortness of breath and dysuria. Respiratory system: Clear to auscultation. Respiratory effort normal.  Good saturation on room air. Cardiovascular system:RRR. No murmurs, rubs, gallops.  No JVD. Gastrointestinal system: Abdomen is nondistended, soft and nontender. No organomegaly or masses felt. Normal bowel sounds heard.  Foley catheter in place. Central nervous system: Alert and oriented. No focal neurological deficits. Extremities: No cyanosis or clubbing; no edema. Skin: No petechiae. Psychiatry: Judgement and insight appear normal. Mood & affect appropriate.   Data Reviewed: Basic metabolic panel: Sodium 762, potassium 3.7, chloride 114, bicarb 19, BUN 62, creatinine 2.83 and GFR 24.  Family Communication: No  family at bedside.  Disposition: Status is: Inpatient Remains inpatient appropriate because: Continue treatment of acute renal failure in the setting of obstructive uropathy; renal function is still not back to baseline.  Patient also demonstrating generalized weakness and deconditioning that will require for safety discharge pathway, short-term rehabilitation at a skilled nursing facility when medically stable.   Planned Discharge Destination: Skilled nursing facility  Time spent: 35 minutes  Author: Barton Dubois, MD 12/21/2022 8:52 AM  For on call review [email or web address].

## 2022-12-21 NOTE — Progress Notes (Signed)
This nurse assuming care, pt laying in bed resting comfortably, alert and oriented x 3, denies pain or discomfort. Pt on room air, PIV patent, infusing NS@ 73ml/hr, pt tolerating well. Foley catheter to gravity draining clear, amber colored urine.  No complaints voiced, no acute or apparent distress noted.  Bed low, call light in reach.

## 2022-12-22 DIAGNOSIS — E785 Hyperlipidemia, unspecified: Secondary | ICD-10-CM | POA: Diagnosis not present

## 2022-12-22 DIAGNOSIS — E872 Acidosis, unspecified: Secondary | ICD-10-CM | POA: Diagnosis not present

## 2022-12-22 DIAGNOSIS — Z960 Presence of urogenital implants: Secondary | ICD-10-CM | POA: Diagnosis not present

## 2022-12-22 DIAGNOSIS — R531 Weakness: Secondary | ICD-10-CM | POA: Diagnosis not present

## 2022-12-22 DIAGNOSIS — N401 Enlarged prostate with lower urinary tract symptoms: Secondary | ICD-10-CM

## 2022-12-22 DIAGNOSIS — M6281 Muscle weakness (generalized): Secondary | ICD-10-CM | POA: Diagnosis not present

## 2022-12-22 DIAGNOSIS — N320 Bladder-neck obstruction: Secondary | ICD-10-CM | POA: Diagnosis not present

## 2022-12-22 DIAGNOSIS — E1169 Type 2 diabetes mellitus with other specified complication: Secondary | ICD-10-CM | POA: Diagnosis not present

## 2022-12-22 DIAGNOSIS — Z8673 Personal history of transient ischemic attack (TIA), and cerebral infarction without residual deficits: Secondary | ICD-10-CM | POA: Diagnosis not present

## 2022-12-22 DIAGNOSIS — R338 Other retention of urine: Secondary | ICD-10-CM

## 2022-12-22 DIAGNOSIS — E119 Type 2 diabetes mellitus without complications: Secondary | ICD-10-CM | POA: Diagnosis not present

## 2022-12-22 DIAGNOSIS — N179 Acute kidney failure, unspecified: Secondary | ICD-10-CM | POA: Diagnosis not present

## 2022-12-22 DIAGNOSIS — I639 Cerebral infarction, unspecified: Secondary | ICD-10-CM | POA: Diagnosis not present

## 2022-12-22 DIAGNOSIS — R269 Unspecified abnormalities of gait and mobility: Secondary | ICD-10-CM | POA: Diagnosis not present

## 2022-12-22 DIAGNOSIS — N139 Obstructive and reflux uropathy, unspecified: Secondary | ICD-10-CM | POA: Diagnosis not present

## 2022-12-22 DIAGNOSIS — E669 Obesity, unspecified: Secondary | ICD-10-CM

## 2022-12-22 DIAGNOSIS — F17200 Nicotine dependence, unspecified, uncomplicated: Secondary | ICD-10-CM | POA: Diagnosis not present

## 2022-12-22 DIAGNOSIS — I10 Essential (primary) hypertension: Secondary | ICD-10-CM | POA: Diagnosis not present

## 2022-12-22 DIAGNOSIS — M48061 Spinal stenosis, lumbar region without neurogenic claudication: Secondary | ICD-10-CM | POA: Diagnosis not present

## 2022-12-22 DIAGNOSIS — R293 Abnormal posture: Secondary | ICD-10-CM | POA: Diagnosis not present

## 2022-12-22 LAB — BASIC METABOLIC PANEL
Anion gap: 8 (ref 5–15)
BUN: 34 mg/dL — ABNORMAL HIGH (ref 8–23)
CO2: 18 mmol/L — ABNORMAL LOW (ref 22–32)
Calcium: 8.2 mg/dL — ABNORMAL LOW (ref 8.9–10.3)
Chloride: 114 mmol/L — ABNORMAL HIGH (ref 98–111)
Creatinine, Ser: 1.85 mg/dL — ABNORMAL HIGH (ref 0.61–1.24)
GFR, Estimated: 41 mL/min — ABNORMAL LOW (ref >60)
Glucose, Bld: 88 mg/dL (ref 70–99)
Potassium: 3.6 mmol/L (ref 3.5–5.1)
Sodium: 140 mmol/L (ref 135–145)

## 2022-12-22 LAB — GLUCOSE, CAPILLARY
Glucose-Capillary: 92 mg/dL (ref 70–99)
Glucose-Capillary: 93 mg/dL (ref 70–99)

## 2022-12-22 MED ORDER — SODIUM BICARBONATE 650 MG PO TABS: 650.0000 mg | ORAL_TABLET | Freq: Two times a day (BID) | ORAL | 0 refills | Status: AC

## 2022-12-22 MED ORDER — TAMSULOSIN HCL 0.4 MG PO CAPS: 0.4000 mg | ORAL_CAPSULE | Freq: Every day | ORAL | 2 refills | Status: DC

## 2022-12-22 MED ORDER — METOPROLOL TARTRATE 25 MG PO TABS: 25.0000 mg | ORAL_TABLET | Freq: Two times a day (BID) | ORAL | 2 refills | Status: AC

## 2022-12-22 NOTE — Discharge Summary (Signed)
Physician Discharge Summary   Patient: Isaiah Ellis MRN: [id number] DOB: [birth year]  Admit date:     12/18/2022  Discharge date: 12/22/22  Discharge Physician: Vassie Loll   PCP: Anabel Halon, MD   Recommendations at discharge:  Reassess blood pressure and adjust antihypertensive regimen as needed Repeat basic metabolic panel to follow electrolytes/renal function Make sure patient has follow-up with urology service as instructed Continue to closely follow patient's CBGs with further adjustment to hypoglycemia regimen as required.  Discharge Diagnoses: Principal Problem:   AKI (acute kidney injury) (HCC) Active Problems:   Essential hypertension   Hyperlipidemia   Type 2 diabetes mellitus with other specified complication (HCC)   Acute urinary retention   Benign prostatic hyperplasia with urinary retention  Hospital Course: As per H&P written by Dr. Randol Kern on 12/18/2022  Isaiah Ellis  is a 62 y.o. male, with past medical history of diabetes mellitus, hypertension, hyperlipidemia, he presents to ED secondary to complaint generalized weakness, fatigue and trouble walking, he says he had a fall, but he did not have a significant injury, he has chronic right-sided weakness, this is status seems unchanged, by himself, with a visiting nurse, he denies headache, nausea, vomiting, chest pain, he does report difficulty urinating,. -In ED his creatinine was noticed to be elevated at 6.9, from baseline of 1, blood pressure was soft, bladder scan was significant for> 9 9, Foley catheter was inserted with immediate 2.2 urine output (there was more urine but it had foley was clamped temporarily) [facility] hospital[facility] consulted to admit.    Assessment and Plan: 1-acute renal failure/BPH -In the setting of obstructive uropathy -Enlarged prostate appreciated on CT scan. -Patient with over 2 L urine produced after Foley insertion -Renal function adequately responding and downtrending. -Continue  minimizing/avoiding nephrotoxic agents -Continue to maintain adequate hydration -Continue to avoid the use of contrast and hypotension -Repeat basic metabolic panel to follow electrolytes and renal function trend. -Patient failed to urinate without Foley catheter in place; catheter was replaced and outpatient follow-up with urology service has been instructed. -Creatinine down to 1.8 at time of discharge.   2-HTN -Stable overall -Continue using metoprolol and Flomax currently. -Lisinopril and Norvasc has been kept on hold at time of discharge to continue allowing complete renal function recovery and a stable blood pressure.   3-type 2 diabetes -A1c 5.7 -While inpatient treatment with a sliding scale insulin was provided. -Safe to resume home oral hypoglycemics.   4-lipidemia -Continue statin -Healthy diet discussed with patient.   5-generalized weakness -Most likely associated with acute renal failure, dehydration and deconditioning -Physical therapy has evaluated patient with recommendation for a skilled nursing facility placement for short-term rehab at discharge. -TOC aware and helping with placement for short-term rehabilitation..   6-class I obesity -Low-calorie diet, portion control and increase physical activity discussed with patient. -Body mass index is 31.15 kg/m.   7-metabolic acidosis -In the setting of renal failure -Continue to maintain adequate hydration and continue to follow electrolytes and renal function trend -Patient has been discharged on sodium bicarbonate for 15 more days in order to continue assisting with resolution of metabolic acidosis problem. -At discharge bicarb level 18.  Consultants: Urology curbside. Procedures performed: See below for x-ray reports. Disposition: Skilled nursing facility (short-term rehabilitation). Diet recommendation: Heart healthy and modified carbohydrate diet.  DISCHARGE MEDICATION: Allergies as of 12/22/2022        Reactions   Bee Venom Swelling   Localized        Medication List  STOP taking these medications    amLODipine 10 MG tablet Commonly known as: NORVASC   lisinopril 20 MG tablet Commonly known as: ZESTRIL       TAKE these medications    aspirin EC 81 MG tablet Take 1 tablet (81 mg total) by mouth daily. Swallow whole.   atorvastatin 40 MG tablet Commonly known as: LIPITOR Take 1 tablet (40 mg total) by mouth daily.   Male Urinal Misc PRN   metFORMIN 500 MG tablet Commonly known as: GLUCOPHAGE Take 1 tablet (500 mg total) by mouth 2 (two) times daily with a meal.   metoprolol tartrate 25 MG tablet Commonly known as: LOPRESSOR Take 1 tablet (25 mg total) by mouth 2 (two) times daily. What changed:  medication strength how much to take how to take this when to take this additional instructions   sodium bicarbonate 650 MG tablet Take 1 tablet (650 mg total) by mouth 2 (two) times daily for 15 days.   tamsulosin 0.4 MG Caps capsule Commonly known as: FLOMAX Take 1 capsule (0.4 mg total) by mouth daily after supper.   UNABLE TO FIND Mechanical Lift Chair Dx: I43.32   UNABLE TO FIND Transfer Bench/Bath Chair Dx: R51.88        Contact information for follow-up providers     Lindell Spar, MD. Schedule an appointment as soon as possible for a visit in 10 day(s).   Specialty: Internal Medicine Contact information: [address] [place] [address] [phone]         Cleon Gustin, MD. Schedule an appointment as soon as possible for a visit in 10 day(s).   Specialty: Urology Contact information: [place] [address] [phone]              Contact information for after-discharge care     Destination     [facility] .   Service: Skilled Nursing Contact information: [address] [place]  [place] [phone]                    Discharge Exam: Danley Danker Weights   12/18/22 1151 12/18/22 1639 12/20/22 0542  Weight: 120.2 kg 105.9 kg 107.1 kg     General exam: Alert, awake, oriented x 3; no overnight events.  Patient denies chest pain, nausea, vomiting, shortness of breath and dysuria. Respiratory system: Clear to auscultation. Respiratory effort normal.  Good saturation on room air. Cardiovascular system:RRR. No murmurs, rubs, gallops.  No JVD. Gastrointestinal system: Abdomen is nondistended, soft and nontender. No organomegaly or masses felt. Normal bowel sounds heard.  Foley catheter in place. Central nervous system: Alert and oriented. No focal neurological deficits. Extremities: No cyanosis or clubbing; no edema. Skin: No petechiae. Psychiatry: Judgement and insight appear normal. Mood & affect appropriate.   Condition at discharge: Stable and improved.  The results of significant diagnostics from this hospitalization (including imaging, microbiology, ancillary and laboratory) are listed below for reference.   Imaging Studies: DG Chest Portable 1 View  Result Date: 12/18/2022 CLINICAL DATA:  Weakness. EXAM: PORTABLE CHEST 1 VIEW COMPARISON:  None Available. FINDINGS: The heart size and mediastinal contours are within normal limits. Both lungs are clear. The visualized skeletal structures are unremarkable. IMPRESSION: No active disease. Electronically Signed   By: Marijo Conception M.D.   On: 12/18/2022 13:11   CT Head Wo Contrast  Result Date: 12/18/2022 CLINICAL DATA:  Mental status  change, unknown cause. Weakness, confusion, and gait difficulty. EXAM: CT HEAD WITHOUT CONTRAST TECHNIQUE: Contiguous axial images were obtained from the base of the skull through the vertex without intravenous contrast. RADIATION DOSE REDUCTION: This exam was performed according to the departmental dose-optimization program which includes automated exposure control, adjustment of the mA  and/or kV according to patient size and/or use of iterative reconstruction technique. COMPARISON:  Head CT 07/10/2020 and MRI 08/21/2020 FINDINGS: Brain: There is no evidence of an acute infarct, intracranial hemorrhage, mass, midline shift, or extra-axial fluid collection. There is mild cerebral atrophy. Patchy hypodensities in the cerebral white matter bilaterally are unchanged and nonspecific but compatible with moderate chronic small vessel ischemic disease. Chronic lacunar infarcts are again noted in the pons and left basal ganglia. There is a partial empty sella. Vascular: Calcified atherosclerosis at the skull base. No hyperdense vessel. Skull: No acute fracture or suspicious osseous lesion. Sinuses/Orbits: No significant inflammatory changes in the included portions of the paranasal sinuses. Clear mastoid air cells. Unremarkable included orbits. Other: None. IMPRESSION: 1. No evidence of acute intracranial abnormality. 2. Moderate chronic small vessel ischemic disease. Electronically Signed   By: Logan Bores M.D.   On: 12/18/2022 13:07    Microbiology: Results for orders placed or performed during the hospital encounter of 12/18/22  Resp panel by RT-PCR (RSV, Flu A&B, Covid) Anterior Nasal Swab     Status: None   Collection Time: 12/18/22  1:25 PM   Specimen: Anterior Nasal Swab  Result Value Ref Range Status   SARS Coronavirus 2 by RT PCR NEGATIVE NEGATIVE Final    Comment: (NOTE) SARS-CoV-2 target nucleic acids are NOT DETECTED.  The SARS-CoV-2 RNA is generally detectable in upper respiratory specimens during the acute phase of infection. The lowest concentration of SARS-CoV-2 viral copies this assay can detect is 138 copies/mL. A negative result does not preclude SARS-Cov-2 infection and should not be used as the sole basis for treatment or other patient management decisions. A negative result may occur with  improper specimen collection/handling, submission of specimen other than  nasopharyngeal swab, presence of viral mutation(s) within the areas targeted by this assay, and inadequate number of viral copies(<138 copies/mL). A negative result must be combined with clinical observations, patient history, and epidemiological information. The expected result is Negative.  Fact Sheet for Patients:  EntrepreneurPulse.com.au  Fact Sheet for Healthcare Providers:  IncredibleEmployment.be  This test is no t yet approved or cleared by the [place] FDA and  has been authorized for detection and/or diagnosis of SARS-CoV-2 by FDA under an Emergency Use Authorization (EUA). This EUA will remain  in effect (meaning this test can be used) for the duration of the COVID-19 declaration under Section 564(b)(1) of the Act, 21 U.S.C.section 360bbb-3(b)(1), unless the authorization is terminated  or revoked sooner.       Influenza A by PCR NEGATIVE NEGATIVE Final   Influenza B by PCR NEGATIVE NEGATIVE Final    Comment: (NOTE) The Xpert Xpress SARS-CoV-2/FLU/RSV plus assay is intended as an aid in the diagnosis of influenza from Nasopharyngeal swab specimens and should not be used as a sole basis for treatment. Nasal washings and aspirates are unacceptable for Xpert Xpress SARS-CoV-2/FLU/RSV testing.  Fact Sheet for Patients: EntrepreneurPulse.com.au  Fact Sheet for Healthcare Providers: IncredibleEmployment.be  This test is not yet approved or cleared by the [place] FDA and has been authorized for detection and/or diagnosis of SARS-CoV-2 by FDA under an Emergency Use Authorization (EUA). This EUA will remain in  effect (meaning this test can be used) for the duration of the COVID-19 declaration under Section 564(b)(1) of the Act, 21 U.S.C. section 360bbb-3(b)(1), unless the authorization is terminated or revoked.     Resp Syncytial Virus by PCR NEGATIVE NEGATIVE Final    Comment:  (NOTE) Fact Sheet for Patients: EntrepreneurPulse.com.au  Fact Sheet for Healthcare Providers: IncredibleEmployment.be  This test is not yet approved or cleared by the [place] FDA and has been authorized for detection and/or diagnosis of SARS-CoV-2 by FDA under an Emergency Use Authorization (EUA). This EUA will remain in effect (meaning this test can be used) for the duration of the COVID-19 declaration under Section 564(b)(1) of the Act, 21 U.S.C. section 360bbb-3(b)(1), unless the authorization is terminated or revoked.  Performed at [facility], [address], [place], [place] [address]     Labs: CBC: Recent Labs  Lab 12/18/22 1244 12/19/22 0350  WBC 8.5 7.4  NEUTROABS 6.2  --   HGB 11.3* 11.9*  HCT 35.1* 35.7*  MCV 76.6* 75.0*  PLT 224 712   Basic Metabolic Panel: Recent Labs  Lab 12/18/22 1244 12/19/22 0350 12/20/22 0426 12/21/22 0349 12/22/22 0440  NA 136 137 141 139 140  K 4.1 3.7 3.7 3.5 3.6  CL 104 112* 114* 113* 114*  CO2 17* 16* 19* 16* 18*  GLUCOSE 84 94 92 80 88  BUN 103* 90* 62* 43* 34*  CREATININE 6.41* 4.73* 2.83* 1.95* 1.85*  CALCIUM 9.5 8.8* 8.5* 8.0* 8.2*  MG  --  1.8  --   --   --    Liver Function Tests: Recent Labs  Lab 12/18/22 1244  AST 21  ALT 12  ALKPHOS 83  BILITOT 0.4  PROT 7.4  ALBUMIN 4.0   CBG: Recent Labs  Lab 12/21/22 1129 12/21/22 1703 12/21/22 2110 12/22/22 0805 12/22/22 1222  GLUCAP 107* 106* 115* 92 93    Discharge time spent: greater than 30 minutes.  Signed: Barton Dubois, MD [facility] 12/22/2022

## 2022-12-22 NOTE — Progress Notes (Signed)
Ng Discharge Note  Admit Date:  12/18/2022 Discharge date: 12/22/2022   Isaiah Ellis to be D/C'd Skilled nursing facility per MD order.  AVS completed. Patient/caregiver able to verbalize understanding.  Discharge Medication: Allergies as of 12/22/2022       Reactions   Bee Venom Swelling   Localized        Medication List     STOP taking these medications    amLODipine 10 MG tablet Commonly known as: NORVASC   lisinopril 20 MG tablet Commonly known as: ZESTRIL       TAKE these medications    aspirin EC 81 MG tablet Take 1 tablet (81 mg total) by mouth daily. Swallow whole.   atorvastatin 40 MG tablet Commonly known as: LIPITOR Take 1 tablet (40 mg total) by mouth daily.   Male Urinal Misc PRN   metFORMIN 500 MG tablet Commonly known as: GLUCOPHAGE Take 1 tablet (500 mg total) by mouth 2 (two) times daily with a meal.   metoprolol tartrate 25 MG tablet Commonly known as: LOPRESSOR Take 1 tablet (25 mg total) by mouth 2 (two) times daily. What changed:  medication strength how much to take how to take this when to take this additional instructions   sodium bicarbonate 650 MG tablet Take 1 tablet (650 mg total) by mouth 2 (two) times daily for 15 days.   tamsulosin 0.4 MG Caps capsule Commonly known as: FLOMAX Take 1 capsule (0.4 mg total) by mouth daily after supper.   UNABLE TO FIND Mechanical Lift Chair Dx: U23.53   UNABLE TO FIND Transfer Bench/Bath Chair Dx: I14.43        Discharge Assessment: Vitals:   12/22/22 0552 12/22/22 1518  BP: 116/79 (!) 142/87  Pulse: 72 70  Resp: 18 20  Temp: 98.9 F (37.2 C) 98.7 F (37.1 C)  SpO2: 100% 100%   Skin clean, dry and intact without evidence of skin break down, no evidence of skin tears noted. IV catheter discontinued intact. Site without signs and symptoms of complications - no redness or edema noted at insertion site, patient denies c/o pain - only slight tenderness at site.  Dressing with  slight pressure applied.  D/c Instructions-Education: Discharge instructions given to patient/family with verbalized understanding. D/c education completed with patient/family including follow up instructions, medication list, d/c activities limitations if indicated, with other d/c instructions as indicated by MD - patient able to verbalize understanding, all questions fully answered. Patient instructed to return to ED, call 911, or call MD for any changes in condition.  Patient escorted via [facility] by staff, to DC with [facility].  Tsosie Billing, LPN 11/21/4006 6:76 PM

## 2022-12-22 NOTE — TOC Progression Note (Signed)
Transition of Care [facility]) - Progression Note    Patient Details  Name: Isaiah Ellis MRN: [id number] Date of Birth: [birth year]  [facility]) CM/SW Contact  Salome Arnt, [place] Phone Number: 12/22/2022, 8:21 AM  Clinical Narrative: LCSW followed up on d/c plan and Delois states pt has agreed to go to [facility]. Facility notified. Pt's authorization is approved. TOC will follow.       Expected Discharge Plan: [place] Barriers to Discharge: Continued Medical Work up  Expected Discharge Plan and Services In-house Referral: Clinical Social Work   Post Acute Care Choice: [place] Living arrangements for the past 2 months: Apartment                                       Social Determinants of Health (SDOH) Interventions SDOH Screenings   Food Insecurity: No Food Insecurity (12/18/2022)  Housing: Low Risk  (12/18/2022)  Transportation Needs: No Transportation Needs (12/18/2022)  Utilities: Not At Risk (12/18/2022)  Depression (PHQ2-9): Low Risk  (09/25/2022)  Tobacco Use: High Risk (12/18/2022)    Readmission Risk Interventions    12/19/2022    3:15 PM  Readmission Risk Prevention Plan  Medication Screening Complete  Transportation Screening Complete

## 2022-12-22 NOTE — Progress Notes (Signed)
Gave report to Nurse Ebony Hail at [facility]. Pt resting in room awaiting transport. Will continue to monitor.

## 2022-12-22 NOTE — Care Management Important Message (Signed)
Important Message  Patient Details  Name: Isaiah Ellis MRN: [id number] Date of Birth: 05/18/1961   Medicare Important Message Given:  Yes     Tommy Medal 12/22/2022, 10:45 AM

## 2022-12-22 NOTE — Progress Notes (Signed)
This nurse inserted 16 fr Foley Catheter per order. Yolanda, NT3 assisted with insertion. Pt tolerated well with good urine return. Will continue to monitor.

## 2022-12-22 NOTE — Progress Notes (Signed)
This Probation officer noted condom cath drainage bag to be empty during shift. Pt denied pain or discomfort. Bladder scan performed which showed >946ml's. MD notified. Order for I&O. Procedure performed by this Probation officer and another LPN. Obtained 1461ml's. Pt tolerated well. MD notified. No new orders at this time.

## 2022-12-22 NOTE — TOC Transition Note (Addendum)
Transition of Care [facility]) - CM/SW Discharge Note   Patient Details  Name: Isaiah Ellis MRN: [id number] Date of Birth: December 30, 1960  [facility]) CM/SW Contact:  Salome Arnt, [place] Phone Number: 12/22/2022, 2:31 PM   Clinical Narrative:  Pt d/c today to [facility]. Discussed with pt and Delois who are agreeable. Authorization received. RN given number to call report. D/C summary sent. Delois is working on finding a ride for pt. She will notify LCSW if Betsy Pries is needed.   Update: Pt will need [facility]. Pt signed transportation waiver and waiver placed on chart. LCSW arranged transportation with [facility].   Final next level of care: [place] Barriers to Discharge: Barriers Resolved   Patient Goals and CMS Choice CMS Medicare.gov Compare Post Acute Care list provided to:: Patient Choice offered to / list presented to : Patient  Discharge Placement                Patient chooses bed at: Other - please specify in the comment section below: [facility]) Patient to be transferred to facility by: family Name of family member notified: Delois- girlfriend Patient and family notified of of transfer: 12/22/22  Discharge Plan and Services Additional resources added to the After Visit Summary for   In-house Referral: Clinical Social Work   Post Acute Care Choice: [place]                               Social Determinants of Health (SDOH) Interventions SDOH Screenings   Food Insecurity: No Food Insecurity (12/18/2022)  Housing: Low Risk  (12/18/2022)  Transportation Needs: No Transportation Needs (12/18/2022)  Utilities: Not At Risk (12/18/2022)  Depression (PHQ2-9): Low Risk  (09/25/2022)  Tobacco Use: High Risk (12/18/2022)     Readmission Risk Interventions    12/19/2022    3:15 PM  Readmission Risk Prevention Plan  Medication Screening Complete  Transportation Screening Complete

## 2022-12-23 DIAGNOSIS — E119 Type 2 diabetes mellitus without complications: Secondary | ICD-10-CM | POA: Diagnosis not present

## 2022-12-23 DIAGNOSIS — E785 Hyperlipidemia, unspecified: Secondary | ICD-10-CM | POA: Diagnosis not present

## 2022-12-23 DIAGNOSIS — N139 Obstructive and reflux uropathy, unspecified: Secondary | ICD-10-CM | POA: Diagnosis not present

## 2022-12-23 DIAGNOSIS — R531 Weakness: Secondary | ICD-10-CM | POA: Diagnosis not present

## 2022-12-23 DIAGNOSIS — I10 Essential (primary) hypertension: Secondary | ICD-10-CM | POA: Diagnosis not present

## 2022-12-23 DIAGNOSIS — I639 Cerebral infarction, unspecified: Secondary | ICD-10-CM | POA: Diagnosis not present

## 2022-12-23 DIAGNOSIS — E872 Acidosis, unspecified: Secondary | ICD-10-CM | POA: Diagnosis not present

## 2022-12-23 DIAGNOSIS — N179 Acute kidney failure, unspecified: Secondary | ICD-10-CM | POA: Diagnosis not present

## 2022-12-27 DIAGNOSIS — I10 Essential (primary) hypertension: Secondary | ICD-10-CM | POA: Diagnosis not present

## 2022-12-27 DIAGNOSIS — N139 Obstructive and reflux uropathy, unspecified: Secondary | ICD-10-CM | POA: Diagnosis not present

## 2022-12-27 DIAGNOSIS — N179 Acute kidney failure, unspecified: Secondary | ICD-10-CM | POA: Diagnosis not present

## 2022-12-27 DIAGNOSIS — E872 Acidosis, unspecified: Secondary | ICD-10-CM | POA: Diagnosis not present

## 2022-12-27 DIAGNOSIS — E785 Hyperlipidemia, unspecified: Secondary | ICD-10-CM | POA: Diagnosis not present

## 2022-12-27 DIAGNOSIS — I639 Cerebral infarction, unspecified: Secondary | ICD-10-CM | POA: Diagnosis not present

## 2022-12-27 DIAGNOSIS — E119 Type 2 diabetes mellitus without complications: Secondary | ICD-10-CM | POA: Diagnosis not present

## 2022-12-27 DIAGNOSIS — R531 Weakness: Secondary | ICD-10-CM | POA: Diagnosis not present

## 2022-12-30 DIAGNOSIS — N179 Acute kidney failure, unspecified: Secondary | ICD-10-CM | POA: Diagnosis not present

## 2022-12-31 DIAGNOSIS — R531 Weakness: Secondary | ICD-10-CM | POA: Diagnosis not present

## 2022-12-31 DIAGNOSIS — E119 Type 2 diabetes mellitus without complications: Secondary | ICD-10-CM | POA: Diagnosis not present

## 2022-12-31 DIAGNOSIS — E785 Hyperlipidemia, unspecified: Secondary | ICD-10-CM | POA: Diagnosis not present

## 2022-12-31 DIAGNOSIS — N179 Acute kidney failure, unspecified: Secondary | ICD-10-CM | POA: Diagnosis not present

## 2022-12-31 DIAGNOSIS — E872 Acidosis, unspecified: Secondary | ICD-10-CM | POA: Diagnosis not present

## 2022-12-31 DIAGNOSIS — I639 Cerebral infarction, unspecified: Secondary | ICD-10-CM | POA: Diagnosis not present

## 2022-12-31 DIAGNOSIS — N139 Obstructive and reflux uropathy, unspecified: Secondary | ICD-10-CM | POA: Diagnosis not present

## 2022-12-31 DIAGNOSIS — I10 Essential (primary) hypertension: Secondary | ICD-10-CM | POA: Diagnosis not present

## 2023-01-02 DIAGNOSIS — I639 Cerebral infarction, unspecified: Secondary | ICD-10-CM | POA: Diagnosis not present

## 2023-01-02 DIAGNOSIS — N179 Acute kidney failure, unspecified: Secondary | ICD-10-CM | POA: Diagnosis not present

## 2023-01-02 DIAGNOSIS — N139 Obstructive and reflux uropathy, unspecified: Secondary | ICD-10-CM | POA: Diagnosis not present

## 2023-01-02 DIAGNOSIS — I10 Essential (primary) hypertension: Secondary | ICD-10-CM | POA: Diagnosis not present

## 2023-01-02 DIAGNOSIS — E872 Acidosis, unspecified: Secondary | ICD-10-CM | POA: Diagnosis not present

## 2023-01-02 DIAGNOSIS — E785 Hyperlipidemia, unspecified: Secondary | ICD-10-CM | POA: Diagnosis not present

## 2023-01-02 DIAGNOSIS — E119 Type 2 diabetes mellitus without complications: Secondary | ICD-10-CM | POA: Diagnosis not present

## 2023-01-02 DIAGNOSIS — R531 Weakness: Secondary | ICD-10-CM | POA: Diagnosis not present

## 2023-01-05 DIAGNOSIS — I10 Essential (primary) hypertension: Secondary | ICD-10-CM | POA: Diagnosis not present

## 2023-01-05 DIAGNOSIS — E119 Type 2 diabetes mellitus without complications: Secondary | ICD-10-CM | POA: Diagnosis not present

## 2023-01-08 ENCOUNTER — Encounter: Payer: Medicare Other | Admitting: Internal Medicine

## 2023-01-09 DIAGNOSIS — I639 Cerebral infarction, unspecified: Secondary | ICD-10-CM | POA: Diagnosis not present

## 2023-01-09 DIAGNOSIS — R531 Weakness: Secondary | ICD-10-CM | POA: Diagnosis not present

## 2023-01-09 DIAGNOSIS — N139 Obstructive and reflux uropathy, unspecified: Secondary | ICD-10-CM | POA: Diagnosis not present

## 2023-01-09 DIAGNOSIS — E119 Type 2 diabetes mellitus without complications: Secondary | ICD-10-CM | POA: Diagnosis not present

## 2023-01-09 DIAGNOSIS — N179 Acute kidney failure, unspecified: Secondary | ICD-10-CM | POA: Diagnosis not present

## 2023-01-09 DIAGNOSIS — E785 Hyperlipidemia, unspecified: Secondary | ICD-10-CM | POA: Diagnosis not present

## 2023-01-09 DIAGNOSIS — I10 Essential (primary) hypertension: Secondary | ICD-10-CM | POA: Diagnosis not present

## 2023-01-09 DIAGNOSIS — E872 Acidosis, unspecified: Secondary | ICD-10-CM | POA: Diagnosis not present

## 2023-01-14 DIAGNOSIS — E872 Acidosis, unspecified: Secondary | ICD-10-CM | POA: Diagnosis not present

## 2023-01-14 DIAGNOSIS — N179 Acute kidney failure, unspecified: Secondary | ICD-10-CM | POA: Diagnosis not present

## 2023-01-14 DIAGNOSIS — N139 Obstructive and reflux uropathy, unspecified: Secondary | ICD-10-CM | POA: Diagnosis not present

## 2023-01-14 DIAGNOSIS — R531 Weakness: Secondary | ICD-10-CM | POA: Diagnosis not present

## 2023-01-14 DIAGNOSIS — M6281 Muscle weakness (generalized): Secondary | ICD-10-CM | POA: Diagnosis not present

## 2023-01-14 DIAGNOSIS — I10 Essential (primary) hypertension: Secondary | ICD-10-CM | POA: Diagnosis not present

## 2023-01-14 DIAGNOSIS — E119 Type 2 diabetes mellitus without complications: Secondary | ICD-10-CM | POA: Diagnosis not present

## 2023-01-14 DIAGNOSIS — E785 Hyperlipidemia, unspecified: Secondary | ICD-10-CM | POA: Diagnosis not present

## 2023-01-14 DIAGNOSIS — I639 Cerebral infarction, unspecified: Secondary | ICD-10-CM | POA: Diagnosis not present

## 2023-01-20 ENCOUNTER — Telehealth: Payer: Self-pay | Admitting: Internal Medicine

## 2023-01-20 NOTE — Telephone Encounter (Signed)
Spoke to Isaiah Ellis, they have to push back his start date of [place] due to unable to reach patient

## 2023-01-20 NOTE — Telephone Encounter (Signed)
Precious Reel [facility] Clinical Manager, [phone]   Wants to delay start date till 01/27/23

## 2023-01-21 DIAGNOSIS — E119 Type 2 diabetes mellitus without complications: Secondary | ICD-10-CM | POA: Diagnosis not present

## 2023-01-21 DIAGNOSIS — I10 Essential (primary) hypertension: Secondary | ICD-10-CM | POA: Diagnosis not present

## 2023-01-26 DIAGNOSIS — R451 Restlessness and agitation: Secondary | ICD-10-CM | POA: Diagnosis not present

## 2023-01-26 DIAGNOSIS — G3184 Mild cognitive impairment, so stated: Secondary | ICD-10-CM | POA: Diagnosis not present

## 2023-01-29 ENCOUNTER — Other Ambulatory Visit: Payer: Self-pay | Admitting: Internal Medicine

## 2023-01-29 DIAGNOSIS — E785 Hyperlipidemia, unspecified: Secondary | ICD-10-CM | POA: Diagnosis not present

## 2023-01-29 DIAGNOSIS — I10 Essential (primary) hypertension: Secondary | ICD-10-CM | POA: Diagnosis not present

## 2023-01-29 DIAGNOSIS — E119 Type 2 diabetes mellitus without complications: Secondary | ICD-10-CM | POA: Diagnosis not present

## 2023-01-29 DIAGNOSIS — G3184 Mild cognitive impairment, so stated: Secondary | ICD-10-CM | POA: Diagnosis not present

## 2023-01-29 DIAGNOSIS — I639 Cerebral infarction, unspecified: Secondary | ICD-10-CM | POA: Diagnosis not present

## 2023-01-29 MED ORDER — ATORVASTATIN CALCIUM 40 MG PO TABS: 40.0000 mg | ORAL_TABLET | Freq: Every day | ORAL | 1 refills | Status: AC

## 2023-02-06 DIAGNOSIS — E119 Type 2 diabetes mellitus without complications: Secondary | ICD-10-CM | POA: Diagnosis not present

## 2023-02-06 DIAGNOSIS — I10 Essential (primary) hypertension: Secondary | ICD-10-CM | POA: Diagnosis not present

## 2023-02-06 DIAGNOSIS — E782 Mixed hyperlipidemia: Secondary | ICD-10-CM | POA: Diagnosis not present

## 2023-02-13 DIAGNOSIS — E119 Type 2 diabetes mellitus without complications: Secondary | ICD-10-CM | POA: Diagnosis not present

## 2023-02-16 DIAGNOSIS — R7309 Other abnormal glucose: Secondary | ICD-10-CM | POA: Diagnosis not present

## 2023-02-18 DIAGNOSIS — R519 Headache, unspecified: Secondary | ICD-10-CM | POA: Diagnosis not present

## 2023-02-23 DIAGNOSIS — E119 Type 2 diabetes mellitus without complications: Secondary | ICD-10-CM | POA: Diagnosis not present

## 2023-02-23 DIAGNOSIS — I10 Essential (primary) hypertension: Secondary | ICD-10-CM | POA: Diagnosis not present

## 2023-02-25 DIAGNOSIS — E782 Mixed hyperlipidemia: Secondary | ICD-10-CM | POA: Diagnosis not present

## 2023-02-25 DIAGNOSIS — I10 Essential (primary) hypertension: Secondary | ICD-10-CM | POA: Diagnosis not present

## 2023-03-02 ENCOUNTER — Encounter: Payer: Medicare Other | Admitting: Family Medicine

## 2023-03-03 DIAGNOSIS — E119 Type 2 diabetes mellitus without complications: Secondary | ICD-10-CM | POA: Diagnosis not present

## 2023-03-03 DIAGNOSIS — I10 Essential (primary) hypertension: Secondary | ICD-10-CM | POA: Diagnosis not present

## 2023-03-03 DIAGNOSIS — I639 Cerebral infarction, unspecified: Secondary | ICD-10-CM | POA: Diagnosis not present

## 2023-03-03 DIAGNOSIS — E785 Hyperlipidemia, unspecified: Secondary | ICD-10-CM | POA: Diagnosis not present

## 2023-03-04 DIAGNOSIS — M6281 Muscle weakness (generalized): Secondary | ICD-10-CM | POA: Diagnosis not present

## 2023-03-04 DIAGNOSIS — N179 Acute kidney failure, unspecified: Secondary | ICD-10-CM | POA: Diagnosis not present

## 2023-03-05 DIAGNOSIS — N179 Acute kidney failure, unspecified: Secondary | ICD-10-CM | POA: Diagnosis not present

## 2023-03-05 DIAGNOSIS — M6281 Muscle weakness (generalized): Secondary | ICD-10-CM | POA: Diagnosis not present

## 2023-03-06 DIAGNOSIS — N179 Acute kidney failure, unspecified: Secondary | ICD-10-CM | POA: Diagnosis not present

## 2023-03-06 DIAGNOSIS — M6281 Muscle weakness (generalized): Secondary | ICD-10-CM | POA: Diagnosis not present

## 2023-03-09 DIAGNOSIS — N179 Acute kidney failure, unspecified: Secondary | ICD-10-CM | POA: Diagnosis not present

## 2023-03-09 DIAGNOSIS — M6281 Muscle weakness (generalized): Secondary | ICD-10-CM | POA: Diagnosis not present

## 2023-03-10 ENCOUNTER — Encounter: Payer: 59 | Admitting: Family Medicine

## 2023-03-10 DIAGNOSIS — M6281 Muscle weakness (generalized): Secondary | ICD-10-CM | POA: Diagnosis not present

## 2023-03-10 DIAGNOSIS — N179 Acute kidney failure, unspecified: Secondary | ICD-10-CM | POA: Diagnosis not present

## 2023-03-10 NOTE — Progress Notes (Signed)
ERRONEOUS ENCOUNTER-DISREGARD   NO SHOW APPOINTMENT  Called 4 x no answer

## 2023-03-12 DIAGNOSIS — N179 Acute kidney failure, unspecified: Secondary | ICD-10-CM | POA: Diagnosis not present

## 2023-03-12 DIAGNOSIS — M6281 Muscle weakness (generalized): Secondary | ICD-10-CM | POA: Diagnosis not present

## 2023-03-13 DIAGNOSIS — M6281 Muscle weakness (generalized): Secondary | ICD-10-CM | POA: Diagnosis not present

## 2023-03-13 DIAGNOSIS — N179 Acute kidney failure, unspecified: Secondary | ICD-10-CM | POA: Diagnosis not present

## 2023-03-14 DIAGNOSIS — M6281 Muscle weakness (generalized): Secondary | ICD-10-CM | POA: Diagnosis not present

## 2023-03-14 DIAGNOSIS — N179 Acute kidney failure, unspecified: Secondary | ICD-10-CM | POA: Diagnosis not present

## 2023-03-16 DIAGNOSIS — M6281 Muscle weakness (generalized): Secondary | ICD-10-CM | POA: Diagnosis not present

## 2023-03-16 DIAGNOSIS — N179 Acute kidney failure, unspecified: Secondary | ICD-10-CM | POA: Diagnosis not present

## 2023-03-17 DIAGNOSIS — M6281 Muscle weakness (generalized): Secondary | ICD-10-CM | POA: Diagnosis not present

## 2023-03-17 DIAGNOSIS — N179 Acute kidney failure, unspecified: Secondary | ICD-10-CM | POA: Diagnosis not present

## 2023-08-10 ENCOUNTER — Encounter (HOSPITAL_COMMUNITY): Payer: Self-pay

## 2023-08-10 ENCOUNTER — Other Ambulatory Visit: Payer: Self-pay

## 2023-08-10 ENCOUNTER — Emergency Department (HOSPITAL_COMMUNITY)
Admission: EM | Admit: 2023-08-10 | Discharge: 2023-08-10 | Disposition: A | Discharge status: Home / Self Care | Payer: Medicare (Managed Care) | Attending: Emergency Medicine | Admitting: Emergency Medicine

## 2023-08-10 DIAGNOSIS — Z8673 Personal history of transient ischemic attack (TIA), and cerebral infarction without residual deficits: Secondary | ICD-10-CM | POA: Diagnosis not present

## 2023-08-10 DIAGNOSIS — R319 Hematuria, unspecified: Secondary | ICD-10-CM | POA: Diagnosis present

## 2023-08-10 DIAGNOSIS — Z7982 Long term (current) use of aspirin: Secondary | ICD-10-CM | POA: Diagnosis not present

## 2023-08-10 DIAGNOSIS — I10 Essential (primary) hypertension: Secondary | ICD-10-CM | POA: Diagnosis not present

## 2023-08-10 DIAGNOSIS — Z79899 Other long term (current) drug therapy: Secondary | ICD-10-CM | POA: Diagnosis not present

## 2023-08-10 DIAGNOSIS — E119 Type 2 diabetes mellitus without complications: Secondary | ICD-10-CM | POA: Diagnosis not present

## 2023-08-10 DIAGNOSIS — Z7984 Long term (current) use of oral hypoglycemic drugs: Secondary | ICD-10-CM | POA: Insufficient documentation

## 2023-08-10 NOTE — Discharge Instructions (Addendum)
As we discussed, you have likely sustained a small urethral injury from having the catheter removed earlier today.  This is a common complication of having a catheter placed and should resolve in the next day or so without intervention.  I recommend that you call your doctor to schedule close follow-up.  Additionally, your blood pressure was elevated today.  Please make sure you are taking your blood pressure medication as prescribed every day and checking your blood pressure regularly as well.  Return if development of any new or worsening symptoms.

## 2023-08-10 NOTE — ED Triage Notes (Signed)
Pt BIB [facility] from [facility] c/o bleeding from penis after removal of catheter today.   Pt has been c/o pain over weekend to site, states it was bleeding prior to today. Slight drainage of urine at this time. Bleeding controlled.   Pt in NAD during triage. A & O x 3.

## 2023-08-10 NOTE — ED Notes (Signed)
EMS here to transport pt back to facility. Pt stable

## 2023-08-10 NOTE — ED Provider Notes (Signed)
[place] EMERGENCY DEPARTMENT AT [facility] Provider Note   CSN: [id number] Arrival date & time: 08/10/23  1127     History  Chief Complaint  Patient presents with   Penile Discharge    Isaiah Ellis is a 62 y.o. male.  Patient with history of hypertension, hyperlipidemia, diabetes, CVA presents today with complaints of bleeding from from penis. He states that he has required a foley catheter intermittently since he had a stroke earlier this year. He had his catheter removed earlier today and was able to void without complication. He states that since he had the catheter removed he has had some blood in his urine. He also notes a small blood stain in his underwear. He denies any abdominal pain, penile pain, or discharge. He continues to be able to void without issue. Additionally, of note patient mentions he has not taken his blood pressure medications today. Denies headache, chest pain, or shortness of breath.  The history is provided by the patient. No language interpreter was used.  Penile Discharge       Home Medications Prior to Admission medications   Medication Sig Start Date End Date Taking? Authorizing Provider  aspirin 81 MG EC tablet Take 1 tablet (81 mg total) by mouth daily. Swallow whole. 03/11/21   Anabel Halon, MD  atorvastatin (LIPITOR) 40 MG tablet Take 1 tablet (40 mg total) by mouth daily. 01/29/23   Anabel Halon, MD  Incontinence Supplies (MALE URINAL) MISC PRN 02/19/22   Anabel Halon, MD  metFORMIN (GLUCOPHAGE) 500 MG tablet Take 1 tablet (500 mg total) by mouth 2 (two) times daily with a meal. 12/09/22   Anabel Halon, MD  metoprolol tartrate (LOPRESSOR) 25 MG tablet Take 1 tablet (25 mg total) by mouth 2 (two) times daily. 12/22/22   Vassie Loll, MD  tamsulosin (FLOMAX) 0.4 MG CAPS capsule Take 1 capsule (0.4 mg total) by mouth daily after supper. 12/22/22   Vassie Loll, MD  UNABLE TO FIND Mechanical Lift Chair Dx: [phone] 07/05/21    Anabel Halon, MD  UNABLE TO FIND Transfer Bench/Bath Chair Dx: [phone] 07/05/21   Anabel Halon, MD      Allergies    Bee venom    Review of Systems   Review of Systems  Genitourinary:  Positive for hematuria.  All other systems reviewed and are negative.   Physical Exam Updated Vital Signs BP (!) 181/100 (BP Location: Left Arm)   Pulse 63   Temp 98 F (36.7 C) (Oral)   Resp 14   Ht 6\' 1"  (1.854 m)   Wt 134.7 kg   SpO2 96%   BMI 39.18 kg/m  Physical Exam Vitals and nursing note reviewed. Exam conducted with a chaperone present.  Constitutional:      General: He is not in acute distress.    Appearance: Normal appearance. He is normal weight. He is not ill-appearing, toxic-appearing or diaphoretic.  HENT:     Head: Normocephalic and atraumatic.  Cardiovascular:     Rate and Rhythm: Normal rate.  Pulmonary:     Effort: Pulmonary effort is normal. No respiratory distress.  Abdominal:     General: Abdomen is flat.     Palpations: Abdomen is soft.     Tenderness: There is no abdominal tenderness.  Genitourinary:    Comments: Patients ureteral meatus without swelling or signs of injury or abnormality. Small dried blood stain present in his underwear. No signs of active bleeding or  discharge. No pain Musculoskeletal:        General: Normal range of motion.     Cervical back: Normal range of motion.  Skin:    General: Skin is warm and dry.  Neurological:     General: No focal deficit present.     Mental Status: He is alert.  Psychiatric:        Mood and Affect: Mood normal.        Behavior: Behavior normal.     ED Results / Procedures / Treatments   Labs (all labs ordered are listed, but only abnormal results are displayed) Labs Reviewed - No data to display  EKG None  Radiology No results found.  Procedures Procedures    Medications Ordered in ED Medications - No data to display  ED Course/ Medical Decision Making/ A&P                                  Medical Decision Making  Patient presents today with complaints of hematuria after having his foley catheter removed earlier today.  He is afebrile, nontoxic-appearing, and in no acute distress with reassuring vital signs.  Physical exam reveals no signs of injury to the urethral meatus or active bleeding.  Small dried blood staining present patient's underwear but no active bleeding or discharge. No pain. Patient able to void without issue and without any signs of urinary retention. He is not anticoagulated. Given this, suspect patient sustained a small urethral injury from having the catheter removed earlier today. This should self resolve without intervention.  Discussed same with patient who is understanding and agreement with this.  No further evaluation with labs or imaging is indicated at this time. Recommend discharge with close outpatient follow-up and return precautions.  Patient's blood pressure is also elevated, patient notes he is not taking his blood pressure medication today.  Recommend that he take this medication as soon as he gets home today.  Evaluation and diagnostic testing in the emergency department does not suggest an emergent condition requiring admission or immediate intervention beyond what has been performed at this time.  Plan for discharge with close PCP follow-up.  Patient is understanding and amenable with plan, educated on red flag symptoms that would prompt immediate return.  Patient discharged in stable condition.  Final Clinical Impression(s) / ED Diagnoses Final diagnoses:  Hematuria, unspecified type    Rx / DC Orders ED Discharge Orders     None     An After Visit Summary was printed and given to the patient.     Vear Clock 08/10/23 1452    Terrilee Files, MD 08/10/23 2037

## 2023-08-11 ENCOUNTER — Other Ambulatory Visit: Payer: Self-pay

## 2023-08-11 ENCOUNTER — Emergency Department (HOSPITAL_COMMUNITY)
Admission: EM | Admit: 2023-08-11 | Discharge: 2023-08-11 | Disposition: A | Discharge status: Home / Self Care | Payer: Medicaid Other | Attending: Emergency Medicine | Admitting: Emergency Medicine

## 2023-08-11 DIAGNOSIS — E119 Type 2 diabetes mellitus without complications: Secondary | ICD-10-CM | POA: Insufficient documentation

## 2023-08-11 DIAGNOSIS — Z79899 Other long term (current) drug therapy: Secondary | ICD-10-CM | POA: Diagnosis not present

## 2023-08-11 DIAGNOSIS — Z7984 Long term (current) use of oral hypoglycemic drugs: Secondary | ICD-10-CM | POA: Insufficient documentation

## 2023-08-11 DIAGNOSIS — N390 Urinary tract infection, site not specified: Secondary | ICD-10-CM | POA: Diagnosis not present

## 2023-08-11 DIAGNOSIS — R109 Unspecified abdominal pain: Secondary | ICD-10-CM | POA: Diagnosis present

## 2023-08-11 DIAGNOSIS — Z7982 Long term (current) use of aspirin: Secondary | ICD-10-CM | POA: Insufficient documentation

## 2023-08-11 DIAGNOSIS — I10 Essential (primary) hypertension: Secondary | ICD-10-CM | POA: Insufficient documentation

## 2023-08-11 DIAGNOSIS — R339 Retention of urine, unspecified: Secondary | ICD-10-CM | POA: Insufficient documentation

## 2023-08-11 LAB — CBC WITH DIFFERENTIAL/PLATELET
Abs Immature Granulocytes: 0.06 10*3/uL (ref 0.00–0.07)
Basophils Absolute: 0.1 10*3/uL (ref 0.0–0.1)
Basophils Relative: 1 %
Eosinophils Absolute: 0.2 10*3/uL (ref 0.0–0.5)
Eosinophils Relative: 1 %
HCT: 43.1 % (ref 39.0–52.0)
Hemoglobin: 13.6 g/dL (ref 13.0–17.0)
Immature Granulocytes: 1 %
Lymphocytes Relative: 6 %
Lymphs Abs: 0.7 10*3/uL (ref 0.7–4.0)
MCH: 23.7 pg — ABNORMAL LOW (ref 26.0–34.0)
MCHC: 31.6 g/dL (ref 30.0–36.0)
MCV: 75.1 fL — ABNORMAL LOW (ref 80.0–100.0)
Monocytes Absolute: 1.8 10*3/uL — ABNORMAL HIGH (ref 0.1–1.0)
Monocytes Relative: 15 %
Neutro Abs: 9.2 10*3/uL — ABNORMAL HIGH (ref 1.7–7.7)
Neutrophils Relative %: 76 %
Platelets: 325 10*3/uL (ref 150–400)
RBC: 5.74 MIL/uL (ref 4.22–5.81)
RDW: 19.1 % — ABNORMAL HIGH (ref 11.5–15.5)
WBC: 11.9 10*3/uL — ABNORMAL HIGH (ref 4.0–10.5)
nRBC: 0 % (ref 0.0–0.2)

## 2023-08-11 LAB — BASIC METABOLIC PANEL
Anion gap: 14 (ref 5–15)
BUN: 44 mg/dL — ABNORMAL HIGH (ref 8–23)
CO2: 21 mmol/L — ABNORMAL LOW (ref 22–32)
Calcium: 9.4 mg/dL (ref 8.9–10.3)
Chloride: 106 mmol/L (ref 98–111)
Creatinine, Ser: 2.64 mg/dL — ABNORMAL HIGH (ref 0.61–1.24)
GFR, Estimated: 27 mL/min — ABNORMAL LOW (ref >60)
Glucose, Bld: 92 mg/dL (ref 70–99)
Potassium: 3.7 mmol/L (ref 3.5–5.1)
Sodium: 141 mmol/L (ref 135–145)

## 2023-08-11 LAB — URINALYSIS, ROUTINE W REFLEX MICROSCOPIC
Bilirubin Urine: NEGATIVE
Glucose, UA: NEGATIVE mg/dL
Ketones, ur: NEGATIVE mg/dL
Nitrite: NEGATIVE
Protein, ur: 30 mg/dL — AB
Specific Gravity, Urine: 1.01 (ref 1.005–1.030)
WBC, UA: >50 WBC/hpf (ref 0–5)
pH: 6 (ref 5.0–8.0)

## 2023-08-11 MED ORDER — SODIUM CHLORIDE 0.9 % IV SOLN
1.0000 g | Freq: Once | INTRAVENOUS | Status: AC
  Administered 2023-08-11: 1 g via INTRAVENOUS
  Filled 2023-08-11: qty 10

## 2023-08-11 MED ORDER — OXYBUTYNIN CHLORIDE 5 MG PO TABS
5.0000 mg | ORAL_TABLET | Freq: Once | ORAL | Status: AC
  Administered 2023-08-11: 5 mg via ORAL
  Filled 2023-08-11: qty 1

## 2023-08-11 MED ORDER — FENTANYL CITRATE PF 50 MCG/ML IJ SOSY
50.0000 ug | PREFILLED_SYRINGE | Freq: Once | INTRAMUSCULAR | Status: AC
  Administered 2023-08-11: 50 ug via INTRAVENOUS
  Filled 2023-08-11: qty 1

## 2023-08-11 MED ORDER — OXYBUTYNIN CHLORIDE ER 10 MG PO TB24: 10.0000 mg | ORAL_TABLET | Freq: Every day | ORAL | 0 refills | Status: AC

## 2023-08-11 MED ORDER — CEPHALEXIN 500 MG PO CAPS: 500.0000 mg | ORAL_CAPSULE | Freq: Three times a day (TID) | ORAL | 0 refills | Status: DC

## 2023-08-11 MED ORDER — SODIUM CHLORIDE 0.9 % IV BOLUS
1000.0000 mL | Freq: Once | INTRAVENOUS | Status: AC
  Administered 2023-08-11: 1000 mL via INTRAVENOUS

## 2023-08-11 NOTE — ED Notes (Signed)
Uses a wheelchair

## 2023-08-11 NOTE — ED Provider Notes (Signed)
[place] EMERGENCY DEPARTMENT AT [facility] Provider Note   CSN: [id number] Arrival date & time: 08/11/23  1144     History  Chief Complaint  Patient presents with   prostate spasms    Isaiah Ellis is a 62 y.o. male.  He has a history of stroke hypertension hyperlipidemia diabetes.  He said they removed his Foley catheter yesterday and he has had on and off pain in that area since then.  He was seen yesterday for blood from the meatus.  He said he has had no further bleeding but continues to have the spasms.  He rates it as 9 out of 10.  He says they occur every few minutes.  He tried Tylenol without improvement.  No fevers chills nausea vomiting.  The history is provided by the patient and the EMS personnel.  Male GU Problem Relieved by:  Nothing Worsened by:  Nothing Ineffective treatments:  OTC medications Associated symptoms: abdominal pain   Associated symptoms: no fever        Home Medications Prior to Admission medications   Medication Sig Start Date End Date Taking? Authorizing Provider  aspirin 81 MG EC tablet Take 1 tablet (81 mg total) by mouth daily. Swallow whole. 03/11/21   Anabel Halon, MD  atorvastatin (LIPITOR) 40 MG tablet Take 1 tablet (40 mg total) by mouth daily. 01/29/23   Anabel Halon, MD  Incontinence Supplies (MALE URINAL) MISC PRN 02/19/22   Anabel Halon, MD  metFORMIN (GLUCOPHAGE) 500 MG tablet Take 1 tablet (500 mg total) by mouth 2 (two) times daily with a meal. 12/09/22   Anabel Halon, MD  metoprolol tartrate (LOPRESSOR) 25 MG tablet Take 1 tablet (25 mg total) by mouth 2 (two) times daily. 12/22/22   Vassie Loll, MD  tamsulosin (FLOMAX) 0.4 MG CAPS capsule Take 1 capsule (0.4 mg total) by mouth daily after supper. 12/22/22   Vassie Loll, MD  UNABLE TO FIND Mechanical Lift Chair Dx: [phone] 07/05/21   Anabel Halon, MD  UNABLE TO FIND Transfer Bench/Bath Chair Dx: [phone] 07/05/21   Anabel Halon, MD      Allergies    Bee  venom    Review of Systems   Review of Systems  Constitutional:  Negative for fever.  Respiratory:  Negative for shortness of breath.   Cardiovascular:  Negative for chest pain.  Gastrointestinal:  Positive for abdominal pain.  Genitourinary:  Positive for difficulty urinating.    Physical Exam Updated Vital Signs BP (!) 177/105 (BP Location: Right Arm)   Pulse 63   Temp 98.6 F (37 C) (Oral)   Resp 19   Ht 6\' 1"  (1.854 m)   Wt 134.7 kg   SpO2 97%   BMI 39.18 kg/m  Physical Exam Vitals and nursing note reviewed.  Constitutional:      General: He is not in acute distress.    Appearance: Normal appearance. He is well-developed.  HENT:     Head: Normocephalic and atraumatic.  Eyes:     Conjunctiva/sclera: Conjunctivae normal.  Cardiovascular:     Rate and Rhythm: Normal rate and regular rhythm.     Heart sounds: No murmur heard. Pulmonary:     Effort: Pulmonary effort is normal. No respiratory distress.     Breath sounds: Normal breath sounds.  Abdominal:     Palpations: Abdomen is soft.     Tenderness: There is no abdominal tenderness. There is no guarding or rebound.  Musculoskeletal:  Cervical back: Neck supple.  Skin:    General: Skin is warm and dry.     Capillary Refill: Capillary refill takes less than 2 seconds.  Neurological:     Mental Status: He is alert.     ED Results / Procedures / Treatments   Labs (all labs ordered are listed, but only abnormal results are displayed) Labs Reviewed  BASIC METABOLIC PANEL - Abnormal; Notable for the following components:      Result Value   CO2 21 (*)    BUN 44 (*)    Creatinine, Ser 2.64 (*)    GFR, Estimated 27 (*)    All other components within normal limits  CBC WITH DIFFERENTIAL/PLATELET - Abnormal; Notable for the following components:   WBC 11.9 (*)    MCV 75.1 (*)    MCH 23.7 (*)    RDW 19.1 (*)    Neutro Abs 9.2 (*)    Monocytes Absolute 1.8 (*)    All other components within normal limits   URINALYSIS, ROUTINE W REFLEX MICROSCOPIC - Abnormal; Notable for the following components:   APPearance HAZY (*)    Hgb urine dipstick MODERATE (*)    Protein, ur 30 (*)    Leukocytes,Ua MODERATE (*)    Bacteria, UA FEW (*)    All other components within normal limits  URINE CULTURE    EKG None  Radiology No results found.  Procedures Procedures    Medications Ordered in ED Medications  oxybutynin (DITROPAN) tablet 5 mg (5 mg Oral Given 08/11/23 1223)  fentaNYL (SUBLIMAZE) injection 50 mcg (50 mcg Intravenous Given 08/11/23 1224)  sodium chloride 0.9 % bolus 1,000 mL (0 mLs Intravenous Stopped 08/11/23 1421)  cefTRIAXone (ROCEPHIN) 1 g in sodium chloride 0.9 % 100 mL IVPB (0 g Intravenous Stopped 08/11/23 1515)  fentaNYL (SUBLIMAZE) injection 50 mcg (50 mcg Intravenous Given 08/11/23 1440)    ED Course/ Medical Decision Making/ A&P Clinical Course as of 08/11/23 1656  Tue Aug 11, 2023  1401 Patient had over a liter on his bladder scan.  I reviewed this with him.  Labs also showing elevated creatinine and possible infection.  Patient would likely benefit from replacement of his Foley and IV antibiotics.  He is comfortable with this plan.  He does not have a urologist so we will give him contact information to follow-up as outpatient. [MB]    Clinical Course User Index [MB] Terrilee Files, MD                                 Medical Decision Making Amount and/or Complexity of Data Reviewed Labs: ordered.  Risk Prescription drug management.   This patient complains of pelvic spasms; this involves an extensive number of treatment Options and is a complaint that carries with it a high risk of complications and morbidity. The differential includes urethritis, UTI, bladder spasm, prostatitis  I ordered, reviewed and interpreted labs, which included CBC with mildly elevated white count stable hemoglobin, chemistries with worsening CKD, urinalysis possible signs of infection  sent for culture I ordered medication IV pain medicine and fluids, IV antibiotics, oral Ditropan and reviewed PMP when indicated. Additional history obtained from patient's daughter Previous records obtained and reviewed in epic including recent ED notes Cardiac monitoring reviewed, sinus rhythm Social determinants considered, tobacco use Critical Interventions: None  After the interventions stated above, I reevaluated the patient and found patient had greater than  1 L in his bladder.  He is agreeable to having Foley placed. Admission and further testing considered, no indications for admission at this time.  Symptoms are better after placement of Foley and IV antibiotics.  Will send home with prescription for Ditropan and oral antibiotics.  Also given contact information for urology.  Return instructions discussed         Final Clinical Impression(s) / ED Diagnoses Final diagnoses:  Urinary retention  Lower urinary tract infection    Rx / DC Orders ED Discharge Orders          Ordered    cephALEXin (KEFLEX) 500 MG capsule  3 times daily        08/11/23 1512    oxybutynin (DITROPAN XL) 10 MG 24 hr tablet  Daily at bedtime        08/11/23 1512              Terrilee Files, MD 08/11/23 1658

## 2023-08-11 NOTE — ED Triage Notes (Signed)
Patient BIB [facility] from [facility]. Called out patient foley removed by facility they noticed it began to bleed. Foley was in  place due to prostate issues. Patient has been having prostate spasms for 1 week. EMS put patient on 2L [place] for patient being tachypnea.

## 2023-08-11 NOTE — Discharge Instructions (Addendum)
You were seen in the emergency department for bladder spasms.  You were in urinary retention and so Foley catheter was placed.  You will need follow-up with urology.  We are putting you on antibiotics and medication to help with the spasm.  Please set up an appointment with urology.  Return to the emergency department if any worsening or concerning symptoms.

## 2023-08-11 NOTE — ED Notes (Signed)
Pt placed on list to go back to [facility] via EMS

## 2023-08-11 NOTE — ED Notes (Signed)
Bladder scanned patient it shows > 1,151ml. MD notified.

## 2023-08-11 NOTE — ED Notes (Signed)
Patient given urinal and informed of need for urine specimen. Patient verbalized understanding.

## 2023-08-11 NOTE — ED Notes (Addendum)
Foley placed, Sylvan Cheese RN second witness. 1,458ml urine emptied from foley. MD notified.

## 2023-08-13 LAB — URINE CULTURE

## 2023-08-14 LAB — URINE CULTURE

## 2023-08-15 ENCOUNTER — Telehealth (HOSPITAL_BASED_OUTPATIENT_CLINIC_OR_DEPARTMENT_OTHER): Payer: Self-pay | Admitting: *Deleted

## 2023-08-15 NOTE — Telephone Encounter (Signed)
Post ED Visit - Positive Culture Follow-up: Successful Patient Follow-Up  Culture assessed and recommendations reviewed by:  []  Enzo Bi, Pharm.D. []  Celedonio Miyamoto, Pharm.D., BCPS AQ-ID []  Garvin Fila, Pharm.D., BCPS []  Georgina Pillion, Pharm.D., BCPS []  [place], [address].D., BCPS, AAHIVP []  Estella Husk, Pharm.D., BCPS, AAHIVP []  Lysle Pearl, PharmD, BCPS []  Phillips Climes, PharmD, BCPS []  Agapito Games, PharmD, BCPS [x]  Delmar Landau, PharmD  Positive urine culture  []  Patient discharged without antimicrobial prescription and treatment is now indicated [x]  Organism is resistant to prescribed ED discharge antimicrobial []  Patient with positive blood cultures  Changes discussed with ED provider: Dr. Rhunette Croft  New antibiotic prescription Amoxicillin 875mg  q 12hrs for 7 days Stop Keflex  Faxed report to [place], [place], [place] [phone]  Called and faxed to [facility] date 08/15/23, time 1245   Isaiah Ellis 08/15/2023, 12:42 PM

## 2023-08-15 NOTE — Progress Notes (Signed)
ED Antimicrobial Stewardship Positive Culture Follow Up   Isaiah Ellis is an 62 y.o. male who presented to [facility] on 08/11/2023 with a chief complaint of  Chief Complaint  Patient presents with   prostate spasms    Recent Results (from the past 720 hour(s))  Urine Culture     Status: Abnormal   Collection Time: 08/11/23  2:07 PM   Specimen: Urine, Catheterized  Result Value Ref Range Status   Specimen Description   Final    URINE, CATHETERIZED Performed at [facility], [address], [place], [place] [address]    Special Requests   Final    NONE Performed at [facility], [address] [place], [place] [address]    Culture 20,000 COLONIES/mL ENTEROCOCCUS FAECALIS (A)  Final   Report Status 08/14/2023 FINAL  Final   Organism ID, Bacteria ENTEROCOCCUS FAECALIS (A)  Final      Susceptibility   Enterococcus faecalis - MIC*    AMPICILLIN <=2 SENSITIVE Sensitive     NITROFURANTOIN <=16 SENSITIVE Sensitive     VANCOMYCIN 1 SENSITIVE Sensitive     * 20,000 COLONIES/mL ENTEROCOCCUS FAECALIS    [x]  Treated with Keflex, organism resistant to prescribed antimicrobial  STOP Keflex START: Amoxicillin 875mg  every 12 hours for 7 days (Qty 14; Refills 0)  ED Provider: Rhunette Croft MD   Delmar Landau, PharmD, BCPS 08/15/2023 9:56 AM ED Clinical Pharmacist -  [phone]

## 2023-08-21 ENCOUNTER — Other Ambulatory Visit: Payer: Self-pay | Admitting: Internal Medicine

## 2023-08-21 DIAGNOSIS — Z1212 Encounter for screening for malignant neoplasm of rectum: Secondary | ICD-10-CM

## 2023-08-21 DIAGNOSIS — Z1211 Encounter for screening for malignant neoplasm of colon: Secondary | ICD-10-CM

## 2023-09-07 ENCOUNTER — Ambulatory Visit (INDEPENDENT_AMBULATORY_CARE_PROVIDER_SITE_OTHER): Payer: Medicare (Managed Care) | Admitting: Urology

## 2023-09-07 ENCOUNTER — Encounter: Payer: Self-pay | Admitting: Urology

## 2023-09-07 VITALS — BP 133/88 | HR 60

## 2023-09-07 DIAGNOSIS — R310 Gross hematuria: Secondary | ICD-10-CM | POA: Diagnosis not present

## 2023-09-07 DIAGNOSIS — R338 Other retention of urine: Secondary | ICD-10-CM | POA: Diagnosis not present

## 2023-09-07 DIAGNOSIS — N400 Enlarged prostate without lower urinary tract symptoms: Secondary | ICD-10-CM | POA: Diagnosis not present

## 2023-09-07 DIAGNOSIS — Z978 Presence of other specified devices: Secondary | ICD-10-CM

## 2023-09-07 NOTE — Progress Notes (Signed)
09/07/2023 11:34 AM   Isaiah Ellis December 08, 1960 [id number]  Referring provider: Anabel Halon, MD [address] [place],  [place] [address]  No chief complaint on file.   HPI:  New pt -   1) urinary retention -Isaiah Ellis is a 62 year old male who had a stroke and right-sided weakness.  He went into urinary retention.  This was in February 2024.  His creatinine bumped up to 6.4 and a Foley was placed which reportedly drained about a liter and then quickly diuresed with 2 more liters.  His creatinine dropped to 1.85.  Evidently a voiding trial was attempted in September 2024 but he represented 1 to 2 days later with greater than a liter in his bladder and creatinine bumped back up to 2.6. Foley replaced. He has been on tamsulosin.  He is also on oxybutynin.  2) gross hematuria-he has had some gross hematuria with these episodes.  Today, seen for the above. He's had no imaging of abd or pelvis. He is doing well with the catheter now - he gets "yellow pee and no blood".   He was a DJ and Chartered certified accountant.    PMH: Past Medical History:  Diagnosis Date   Balance problems    Depression    Diabetes mellitus without complication (HCC)    Falling    Gout    Hyperlipidemia    Hypertension    Stroke [facility]) 2005 and 02/2019    Surgical History: No past surgical history on file.  Home Medications:  Allergies as of 09/07/2023       Reactions   Bee Venom Swelling   Localized        Medication List        Accurate as of September 07, 2023 11:34 AM. If you have any questions, ask your nurse or doctor.          aspirin EC 81 MG tablet Take 1 tablet (81 mg total) by mouth daily. Swallow whole.   atorvastatin 40 MG tablet Commonly known as: LIPITOR Take 1 tablet (40 mg total) by mouth daily.   cephALEXin 500 MG capsule Commonly known as: KEFLEX Take 1 capsule (500 mg total) by mouth 3 (three) times daily.   Male Urinal Misc PRN   metFORMIN 500 MG  tablet Commonly known as: GLUCOPHAGE Take 1 tablet (500 mg total) by mouth 2 (two) times daily with a meal.   metoprolol tartrate 25 MG tablet Commonly known as: LOPRESSOR Take 1 tablet (25 mg total) by mouth 2 (two) times daily.   oxybutynin 10 MG 24 hr tablet Commonly known as: Ditropan XL Take 1 tablet (10 mg total) by mouth at bedtime.   tamsulosin 0.4 MG Caps capsule Commonly known as: FLOMAX Take 1 capsule (0.4 mg total) by mouth daily after supper.   UNABLE TO FIND Mechanical Lift Chair Dx: F64.33   UNABLE TO FIND Transfer Bench/Bath Chair Dx: I95.18        Allergies:  Allergies  Allergen Reactions   Bee Venom Swelling    Localized     Family History: Family History  Problem Relation Age of Onset   Hypertension Mother    Stroke Mother    Heart disease Father    Stroke Father    Hypertension Father     Social History:  reports that he has been smoking cigarettes. He has a 10 pack-year smoking history. He has never used smokeless tobacco. He reports that he does not currently use alcohol. He reports  current drug use. Drugs: Marijuana and Cocaine.   Physical Exam: BP 133/88   Pulse 60   Constitutional:  Alert and oriented, No acute distress. HEENT: [place] AT, moist mucus membranes.  Trachea midline, no masses. Cardiovascular: No clubbing, cyanosis, or edema. Respiratory: Normal respiratory effort, no increased work of breathing. GI: Abdomen is soft, nontender, nondistended, no abdominal masses GU: No CVA tenderness Skin: No rashes, bruises or suspicious lesions. Neurologic: Grossly intact, no focal deficits, moving all 4 extremities. Psychiatric: Normal mood and affect.  Laboratory Data: Lab Results  Component Value Date   WBC 11.9 (H) 08/11/2023   HGB 13.6 08/11/2023   HCT 43.1 08/11/2023   MCV 75.1 (L) 08/11/2023   PLT 325 08/11/2023    Lab Results  Component Value Date   CREATININE 2.64 (H) 08/11/2023    No results found for: "PSA"  No  results found for: "TESTOSTERONE"  Lab Results  Component Value Date   HGBA1C 5.7 (H) 09/25/2022    Urinalysis    Component Value Date/Time   COLORURINE YELLOW 08/11/2023 1302   APPEARANCEUR HAZY (A) 08/11/2023 1302   LABSPEC 1.010 08/11/2023 1302   PHURINE 6.0 08/11/2023 1302   GLUCOSEU NEGATIVE 08/11/2023 1302   HGBUR MODERATE (A) 08/11/2023 1302   BILIRUBINUR NEGATIVE 08/11/2023 1302   KETONESUR NEGATIVE 08/11/2023 1302   PROTEINUR 30 (A) 08/11/2023 1302   NITRITE NEGATIVE 08/11/2023 1302   LEUKOCYTESUR MODERATE (A) 08/11/2023 1302    Lab Results  Component Value Date   LABMICR 18.1 09/25/2022   BACTERIA FEW (A) 08/11/2023    Pertinent Imaging:   Assessment & Plan:    1. Chronic indwelling Foley catheter, urinary retention -  Contine foley for now. Discussed CIC, SP tube, foley for the long run. Change q 30 days.   2. Gross hematuria - check CT and cystoscopy. This also evaluate his prostate and perform another voiding trial.   3> BPH - continue tamsulosin. Consider adding 5ari. Hold oxyb prior to cystoscopy.   No follow-ups on file.  Jerilee Field, MD  [facility]  [address] [place], [place] [address] [phone]

## 2023-10-05 ENCOUNTER — Ambulatory Visit: Payer: Medicaid Other | Admitting: Urology

## 2023-10-05 VITALS — BP 128/85 | HR 64

## 2023-10-05 DIAGNOSIS — Z87898 Personal history of other specified conditions: Secondary | ICD-10-CM | POA: Diagnosis not present

## 2023-10-05 DIAGNOSIS — R310 Gross hematuria: Secondary | ICD-10-CM

## 2023-10-05 DIAGNOSIS — R339 Retention of urine, unspecified: Secondary | ICD-10-CM | POA: Diagnosis not present

## 2023-10-05 DIAGNOSIS — N401 Enlarged prostate with lower urinary tract symptoms: Secondary | ICD-10-CM | POA: Diagnosis not present

## 2023-10-05 MED ORDER — FINASTERIDE 5 MG PO TABS: 5.0000 mg | ORAL_TABLET | Freq: Every day | ORAL | 3 refills | Status: DC

## 2023-10-05 MED ORDER — CIPROFLOXACIN HCL 500 MG PO TABS
500.0000 mg | ORAL_TABLET | Freq: Once | ORAL | Status: AC
  Administered 2023-10-05: 500 mg via ORAL

## 2023-10-05 NOTE — Progress Notes (Signed)
Catheter Removal  Patient is present today for a catheter removal.  50ml of water was drained from the balloon. A 18FR foley cath was removed from the bladder, no complications were noted. Patient tolerated well.  Performed by: Guss Bunde, CMA  Fill and Pull Catheter Removal  Patient is present today for a catheter removal.  Patient was cleaned and prepped in a sterile fashion of sterile water/ saline was instilled into the bladder during cysto procedure with MD.  Patient as then given some time to void on their own.  Patient can void  on their own after some time.  Patient tolerated well.   Follow up/ Additional notes: MD to see after

## 2023-10-05 NOTE — Progress Notes (Unsigned)
   10/05/23  CC: No chief complaint on file.   HPI: F/u -   1) urinary retention - Feb 2024 stroke and right-sided weakness and urinary retention. His creatinine bumped up to 6.4 and a Foley was placed which reportedly drained about a liter and then quickly diuresed with 2 more liters.  His creatinine dropped to 1.85.  Evidently a voiding trial was attempted in September 2024 but he represented 1 to 2 days later with greater than a liter in his bladder and creatinine bumped back up to 2.6. Foley replaced. He has been on tamsulosin.  He is also on oxybutynin.   2) gross hematuria-he has had some gross hematuria with these episodes.   Today, seen for the above-for cystoscopy as well as management of BPH. He did not get CT scheduled. Cystoscopy today shows a ball valving median lobe and a moderately enlarged prostate.  Prostatic urethral length about 5 cm.  He was filled to about 400 cc and voided 250 cc with a good stream.  He was given a Cipro.  He like to go without the catheter.   He was a DJ and Chartered certified accountant.  There were no vitals taken for this visit. NED. A&Ox3.   No respiratory distress   Abd soft, NT, ND Normal phallus with bilateral descended testicles  Cystoscopy Procedure Note  Patient identification was confirmed, informed consent was obtained, and patient was prepped using Betadine solution.  Lidocaine jelly was administered per urethral meatus.     Pre-Procedure: - Inspection reveals a normal caliber ureteral meatus.  Procedure: The flexible cystoscope was introduced without difficulty - No urethral strictures/lesions are present. -Obstructing prostate with lateral and median lobe hypertrophy -Normal bladder neck - Bilateral ureteral orifices identified - Bladder mucosa  reveals no ulcers, tumors, or lesions - No bladder stones -Mild trabeculation  Retroflexion shows ball valving median lobe.  Normal bladder neck.   Post-Procedure: - Patient  tolerated the procedure well  Assessment/ Plan:  Urinary retention-likely median lobe and prostate obstruction contributing.  BPH-discussed addition of finasteride.  We went over FDA warnings and he will start.  We went over the nature risk benefits and alternatives to thulium laser vaporization of the prostate possible TURP.  He will give surgery some thought.  He like to avoid it.  Discussed it is fine for him to void in the diapers and when he does request a change.  If he is having any trouble voiding he needs to let someone know.  Consider PSA next time.  Gross hematuria-no recent hematuria noted.  No bladder lesions or stones.  Continue with CT plan.  No follow-ups on file.  Jerilee Field, MD

## 2023-10-06 ENCOUNTER — Telehealth: Payer: Self-pay

## 2023-10-06 LAB — COLOGUARD

## 2023-10-06 NOTE — Telephone Encounter (Signed)
Facility called wanting to know if there are any orders from his visit?

## 2023-10-06 NOTE — Telephone Encounter (Signed)
Called the patient unable to get an answer ofr voicemail. Per Dr Allena Katz teams message sent 11.19.2024 at 10:32 am If unable to reach, okay to send dismissal letter.

## 2023-10-07 NOTE — Telephone Encounter (Signed)
I called facility and left vm requesting call back for verbal orders.

## 2023-10-09 NOTE — Telephone Encounter (Signed)
Lenn Cal Nurse at [facility] was notified of verbal orders from MD.  She states patient has been able to void with out any issue so far.

## 2023-10-26 ENCOUNTER — Ambulatory Visit (HOSPITAL_COMMUNITY)
Admission: RE | Admit: 2023-10-26 | Discharge: 2023-10-26 | Disposition: A | Discharge status: Home / Self Care | Payer: Medicare (Managed Care) | Source: Ambulatory Visit | Attending: Urology | Admitting: Urology

## 2023-10-26 DIAGNOSIS — R310 Gross hematuria: Secondary | ICD-10-CM | POA: Diagnosis present

## 2023-10-26 DIAGNOSIS — N400 Enlarged prostate without lower urinary tract symptoms: Secondary | ICD-10-CM | POA: Diagnosis present

## 2023-12-07 ENCOUNTER — Ambulatory Visit (INDEPENDENT_AMBULATORY_CARE_PROVIDER_SITE_OTHER): Payer: Medicare (Managed Care) | Admitting: Urology

## 2023-12-07 VITALS — BP 123/85 | HR 59

## 2023-12-07 DIAGNOSIS — N401 Enlarged prostate with lower urinary tract symptoms: Secondary | ICD-10-CM

## 2023-12-07 DIAGNOSIS — N138 Other obstructive and reflux uropathy: Secondary | ICD-10-CM

## 2023-12-07 DIAGNOSIS — R3912 Poor urinary stream: Secondary | ICD-10-CM | POA: Diagnosis not present

## 2023-12-07 DIAGNOSIS — Z87898 Personal history of other specified conditions: Secondary | ICD-10-CM | POA: Diagnosis not present

## 2023-12-07 DIAGNOSIS — R310 Gross hematuria: Secondary | ICD-10-CM

## 2023-12-07 MED ORDER — TAMSULOSIN HCL 0.4 MG PO CAPS: 0.4000 mg | ORAL_CAPSULE | Freq: Every day | ORAL | 3 refills | Status: AC

## 2023-12-07 MED ORDER — FINASTERIDE 5 MG PO TABS: 5.0000 mg | ORAL_TABLET | Freq: Every day | ORAL | 3 refills | Status: DC

## 2023-12-07 NOTE — Progress Notes (Signed)
12/07/2023 9:51 AM   Isaiah Ellis July 26, 1961 [id number]  Referring provider: Anabel Halon, MD [address] [place],  [place] [address]  No chief complaint on file.   HPI:  F/u -    1) BPH, urinary retention - Feb 2024 stroke and right-sided weakness and urinary retention. His creatinine bumped up to 6.4 and a Foley was placed which reportedly drained about a liter and then quickly diuresed with 2 more liters.  His creatinine dropped to 1.85.  Evidently a voiding trial was attempted in September 2024 but he represented 1 to 2 days later with greater than a liter in his bladder and creatinine bumped back up to 2.6. Foley replaced. He has been on tamsulosin.  He is also on oxybutynin.  He passed a VT Nov 2024. Cystoscopy showed a ball valving median lobe and a moderately enlarged prostate.  Prostatic urethral length about 5 cm.  He was filled to about 400 cc and voided 250 cc with a good stream.   2) gross hematuria-he has had some gross hematuria with these episodes.   Today, seen for the above. Started finasteride Nov 2024. Dec 2024 CT benign with a 70 g prostate. He is voiding well. Clear urine. PVR 196 ml. He is voiding with better control and no incontinence.    He was a DJ and Chartered certified accountant.  PMH: Past Medical History:  Diagnosis Date   Balance problems    Depression    Diabetes mellitus without complication (HCC)    Falling    Gout    Hyperlipidemia    Hypertension    Stroke [facility]) 2005 and 02/2019    Surgical History: No past surgical history on file.  Home Medications:  Allergies as of 12/07/2023       Reactions   Bee Venom Swelling   Localized        Medication List        Accurate as of December 07, 2023  9:51 AM. If you have any questions, ask your nurse or doctor.          aspirin EC 81 MG tablet Take 1 tablet (81 mg total) by mouth daily. Swallow whole.   atorvastatin 40 MG tablet Commonly known as: LIPITOR Take 1 tablet (40  mg total) by mouth daily.   cephALEXin 500 MG capsule Commonly known as: KEFLEX Take 1 capsule (500 mg total) by mouth 3 (three) times daily.   finasteride 5 MG tablet Commonly known as: Proscar Take 1 tablet (5 mg total) by mouth daily.   Male Urinal Misc PRN   metFORMIN 500 MG tablet Commonly known as: GLUCOPHAGE Take 1 tablet (500 mg total) by mouth 2 (two) times daily with a meal.   metoprolol tartrate 25 MG tablet Commonly known as: LOPRESSOR Take 1 tablet (25 mg total) by mouth 2 (two) times daily.   oxybutynin 10 MG 24 hr tablet Commonly known as: Ditropan XL Take 1 tablet (10 mg total) by mouth at bedtime.   tamsulosin 0.4 MG Caps capsule Commonly known as: FLOMAX Take 1 capsule (0.4 mg total) by mouth daily after supper.   UNABLE TO FIND Mechanical Lift Chair Dx: X91.47   UNABLE TO FIND Transfer Bench/Bath Chair Dx: W29.56        Allergies:  Allergies  Allergen Reactions   Bee Venom Swelling    Localized     Family History: Family History  Problem Relation Age of Onset   Hypertension Mother  Stroke Mother    Heart disease Father    Stroke Father    Hypertension Father     Social History:  reports that he has been smoking cigarettes. He has a 10 pack-year smoking history. He has never used smokeless tobacco. He reports that he does not currently use alcohol. He reports current drug use. Drugs: Marijuana and Cocaine.   Physical Exam: There were no vitals taken for this visit.  Constitutional:  Alert and oriented, No acute distress. HEENT: Flushing AT, moist mucus membranes.  Trachea midline, no masses. Cardiovascular: No clubbing, cyanosis, or edema. Respiratory: Normal respiratory effort, no increased work of breathing. GI: Abdomen is soft, nontender, nondistended, no abdominal masses GU: No CVA tenderness Skin: No rashes, bruises or suspicious lesions. Neurologic: Grossly intact, no focal deficits, moving all 4 extremities. Psychiatric:  Normal mood and affect.  Laboratory Data: Lab Results  Component Value Date   WBC 11.9 (H) 08/11/2023   HGB 13.6 08/11/2023   HCT 43.1 08/11/2023   MCV 75.1 (L) 08/11/2023   PLT 325 08/11/2023    Lab Results  Component Value Date   CREATININE 2.64 (H) 08/11/2023    No results found for: "PSA"  No results found for: "TESTOSTERONE"  Lab Results  Component Value Date   HGBA1C 5.7 (H) 09/25/2022    Urinalysis    Component Value Date/Time   COLORURINE YELLOW 08/11/2023 1302   APPEARANCEUR HAZY (A) 08/11/2023 1302   LABSPEC 1.010 08/11/2023 1302   PHURINE 6.0 08/11/2023 1302   GLUCOSEU NEGATIVE 08/11/2023 1302   HGBUR MODERATE (A) 08/11/2023 1302   BILIRUBINUR NEGATIVE 08/11/2023 1302   KETONESUR NEGATIVE 08/11/2023 1302   PROTEINUR 30 (A) 08/11/2023 1302   NITRITE NEGATIVE 08/11/2023 1302   LEUKOCYTESUR MODERATE (A) 08/11/2023 1302    Lab Results  Component Value Date   LABMICR 18.1 09/25/2022   BACTERIA FEW (A) 08/11/2023    Pertinent Imaging:  Assessment & Plan:    1. Gross hematuria (Primary) Benign eval - Urinalysis, Routine w reflex microscopic  2. BPH, weak stream - continue tamsulosin and finasteride. Also discussed procedures.   No follow-ups on file.  Jerilee Field, MD  [facility]  [address] [place], [place] [address] [phone]

## 2024-01-27 ENCOUNTER — Encounter: Payer: Self-pay | Admitting: *Deleted

## 2024-02-25 ENCOUNTER — Telehealth: Payer: Self-pay | Admitting: *Deleted

## 2024-02-25 NOTE — Telephone Encounter (Signed)
 Received triage questionnaire. Patient marked having a TIA 12/23/22. Will need OV prior to scheduling.

## 2024-03-02 ENCOUNTER — Encounter: Payer: Self-pay | Admitting: Internal Medicine

## 2024-03-02 NOTE — Telephone Encounter (Signed)
 Unable to get in touch with the patient and the phone numbers listed.  I mailed him a letter asking him to get in touch with the office to schedule an OV before procedure is scheduled.

## 2024-03-15 ENCOUNTER — Encounter (INDEPENDENT_AMBULATORY_CARE_PROVIDER_SITE_OTHER): Payer: Self-pay | Admitting: Otolaryngology

## 2024-03-15 ENCOUNTER — Ambulatory Visit: Payer: 59

## 2024-03-23 ENCOUNTER — Encounter (INDEPENDENT_AMBULATORY_CARE_PROVIDER_SITE_OTHER): Payer: Self-pay

## 2024-04-20 ENCOUNTER — Ambulatory Visit: Admitting: Gastroenterology

## 2024-04-20 NOTE — Progress Notes (Deleted)
 GI Office Note    Referring Provider: Meldon Sport, MD Primary Care Physician:  Meldon Sport, MD  Primary Gastroenterologist:  Chief Complaint   No chief complaint on file.    History of Present Illness   Isaiah Ellis is a 63 y.o. male presenting today at the request of Dr. Lydia Sams for schreening colonoscopy.     cocaine      Medications   Current Outpatient Medications  Medication Sig Dispense Refill   aspirin  81 MG EC tablet Take 1 tablet (81 mg total) by mouth daily. Swallow whole. 30 tablet 12   atorvastatin  (LIPITOR) 40 MG tablet Take 1 tablet (40 mg total) by mouth daily. 90 tablet 1   cephALEXin  (KEFLEX ) 500 MG capsule Take 1 capsule (500 mg total) by mouth 3 (three) times daily. 21 capsule 0   finasteride  (PROSCAR ) 5 MG tablet Take 1 tablet (5 mg total) by mouth daily. 90 tablet 3   Incontinence Supplies (MALE URINAL) MISC PRN 6 each 0   metFORMIN  (GLUCOPHAGE ) 500 MG tablet Take 1 tablet (500 mg total) by mouth 2 (two) times daily with a meal. 180 tablet 1   metoprolol  tartrate (LOPRESSOR ) 25 MG tablet Take 1 tablet (25 mg total) by mouth 2 (two) times daily. 60 tablet 2   oxybutynin  (DITROPAN  XL) 10 MG 24 hr tablet Take 1 tablet (10 mg total) by mouth at bedtime. 30 tablet 0   tamsulosin  (FLOMAX ) 0.4 MG CAPS capsule Take 1 capsule (0.4 mg total) by mouth daily after supper. 90 capsule 3   UNABLE TO FIND Mechanical Lift Chair Dx: Z86.73 1 each 0   UNABLE TO FIND Transfer Bench/Bath Chair Dx: Z61.09 1 each 0   No current facility-administered medications for this visit.    Allergies   Allergies as of 04/20/2024 - Review Complete 12/07/2023  Allergen Reaction Noted   Bee venom Swelling 02/13/2019    Past Medical History   Past Medical History:  Diagnosis Date   Balance problems    Depression    Diabetes mellitus without complication (HCC)    Falling    Gout    Hyperlipidemia    Hypertension    Stroke (HCC) 2005 and 02/2019    Past  Surgical History   No past surgical history on file.  Past Family History   Family History  Problem Relation Age of Onset   Hypertension Mother    Stroke Mother    Heart disease Father    Stroke Father    Hypertension Father     Past Social History   Social History   Socioeconomic History   Marital status: Single    Spouse name: Not on file   Number of children: 0   Years of education: Not on file   Highest education level: Associate degree: academic program  Occupational History   Not on file  Tobacco Use   Smoking status: Every Day    Current packs/day: 0.25    Average packs/day: 0.3 packs/day for 40.0 years (10.0 ttl pk-yrs)    Types: Cigarettes   Smokeless tobacco: Never   Tobacco comments:    2-3 cig/ day  Vaping Use   Vaping status: Never Used  Substance and Sexual Activity   Alcohol use: Not Currently    Comment: occasional   Drug use: Yes    Types: Marijuana, Cocaine    Comment: couple of months, no cocaine, 08/08/20 marijuana every now and then   Sexual activity: Not  on file  Other Topics Concern   Not on file  Social History Narrative   Lives alone   No caffeine   Social Drivers of Health   Financial Resource Strain: Not on file  Food Insecurity: No Food Insecurity (12/18/2022)   Hunger Vital Sign    Worried About Running Out of Food in the Last Year: Never true    Ran Out of Food in the Last Year: Never true  Transportation Needs: No Transportation Needs (12/18/2022)   PRAPARE - Administrator, Civil Service (Medical): No    Lack of Transportation (Non-Medical): No  Physical Activity: Not on file  Stress: Not on file  Social Connections: Not on file  Intimate Partner Violence: Not At Risk (12/18/2022)   Humiliation, Afraid, Rape, and Kick questionnaire    Fear of Current or Ex-Partner: No    Emotionally Abused: No    Physically Abused: No    Sexually Abused: No    Review of Systems   General: Negative for anorexia, weight loss,  fever, chills, fatigue, weakness. Eyes: Negative for vision changes.  ENT: Negative for hoarseness, difficulty swallowing , nasal congestion. CV: Negative for chest pain, angina, palpitations, dyspnea on exertion, peripheral edema.  Respiratory: Negative for dyspnea at rest, dyspnea on exertion, cough, sputum, wheezing.  GI: See history of present illness. GU:  Negative for dysuria, hematuria, urinary incontinence, urinary frequency, nocturnal urination.  MS: Negative for joint pain, low back pain.  Derm: Negative for rash or itching.  Neuro: Negative for weakness, abnormal sensation, seizure, frequent headaches, memory loss,  confusion.  Psych: Negative for anxiety, depression, suicidal ideation, hallucinations.  Endo: Negative for unusual weight change.  Heme: Negative for bruising or bleeding. Allergy: Negative for rash or hives.  Physical Exam   There were no vitals taken for this visit.   General: Well-nourished, well-developed in no acute distress.  Head: Normocephalic, atraumatic.   Eyes: Conjunctiva pink, no icterus. Mouth: Oropharyngeal mucosa moist and pink , no lesions erythema or exudate. Neck: Supple without thyromegaly, masses, or lymphadenopathy.  Lungs: Clear to auscultation bilaterally.  Heart: Regular rate and rhythm, no murmurs rubs or gallops.  Abdomen: Bowel sounds are normal, nontender, nondistended, no hepatosplenomegaly or masses,  no abdominal bruits or hernia, no rebound or guarding.   Rectal: *** Extremities: No lower extremity edema. No clubbing or deformities.  Neuro: Alert and oriented x 4 , grossly normal neurologically.  Skin: Warm and dry, no rash or jaundice.   Psych: Alert and cooperative, normal mood and affect.  Labs   Lab Results  Component Value Date   NA 141 08/11/2023   CL 106 08/11/2023   K 3.7 08/11/2023   CO2 21 (L) 08/11/2023   BUN 44 (H) 08/11/2023   CREATININE 2.64 (H) 08/11/2023   GFRNONAA 27 (L) 08/11/2023   CALCIUM  9.4  08/11/2023   ALBUMIN 4.0 12/18/2022   GLUCOSE 92 08/11/2023   Lab Results  Component Value Date   WBC 11.9 (H) 08/11/2023   HGB 13.6 08/11/2023   HCT 43.1 08/11/2023   MCV 75.1 (L) 08/11/2023   PLT 325 08/11/2023   Lab Results  Component Value Date   ALT 12 12/18/2022   AST 21 12/18/2022   ALKPHOS 83 12/18/2022   BILITOT 0.4 12/18/2022   No results found for: "IRON", "TIBC", "FERRITIN" No results found for: "VITAMINB12" No results found for: "FOLATE"  Imaging Studies   No results found.  Assessment       PLAN   ***  Trudie Fuse. Harles Lied, MHS, PA-C [facility]

## 2024-04-21 ENCOUNTER — Institutional Professional Consult (permissible substitution) (INDEPENDENT_AMBULATORY_CARE_PROVIDER_SITE_OTHER): Admitting: Physician Assistant

## 2024-04-26 ENCOUNTER — Institutional Professional Consult (permissible substitution) (INDEPENDENT_AMBULATORY_CARE_PROVIDER_SITE_OTHER): Admitting: Physician Assistant

## 2024-06-23 ENCOUNTER — Encounter (INDEPENDENT_AMBULATORY_CARE_PROVIDER_SITE_OTHER): Payer: Self-pay | Admitting: Physician Assistant

## 2024-06-23 ENCOUNTER — Ambulatory Visit (INDEPENDENT_AMBULATORY_CARE_PROVIDER_SITE_OTHER): Admitting: Physician Assistant

## 2024-06-23 VITALS — BP 135/85 | HR 57

## 2024-06-23 DIAGNOSIS — H6121 Impacted cerumen, right ear: Secondary | ICD-10-CM | POA: Diagnosis not present

## 2024-06-23 NOTE — Progress Notes (Signed)
 Dear Dr. Tobie, Here is my assessment for our mutual patient, Isaiah Ellis. Thank you for allowing me the opportunity to care for your patient. Please do not hesitate to contact me should you have any other questions. Sincerely, Chyrl Cohen PA-C  Otolaryngology Clinic Note Referring provider: Dr. Tobie HPI:  Isaiah Ellis is a 63 y.o. male kindly referred by Dr. Tobie   The patient is a 63 year old gentleman seen in our office for evaluation of cerumen impaction.  The patient notes over the last several weeks he has had difficulty hearing out of his right ear.  He notes that when he pulls down on his right ear he can slightly hear out of it.  He denies any associated pain, no drainage, no trauma that he.  He has not been using Q-tips.  Saw his primary care and was told he had cerumen, they placed him on earwax drops which have not helped remove the earwax.  He notes his baseline hearing is normal.   Independent Review of Additional Tests or Records:  None   PMH/Meds/All/SocHx/FamHx/ROS:   Past Medical History:  Diagnosis Date   Balance problems    Depression    Diabetes mellitus without complication (HCC)    Falling    Gout    Hyperlipidemia    Hypertension    Stroke (HCC) 2005 and 02/2019     History reviewed. No pertinent surgical history.  Family History  Problem Relation Age of Onset   Hypertension Mother    Stroke Mother    Heart disease Father    Stroke Father    Hypertension Father      Social Connections: Not on file      Current Outpatient Medications:    aspirin  81 MG EC tablet, Take 1 tablet (81 mg total) by mouth daily. Swallow whole., Disp: 30 tablet, Rfl: 12   atorvastatin  (LIPITOR) 40 MG tablet, Take 1 tablet (40 mg total) by mouth daily., Disp: 90 tablet, Rfl: 1   cephALEXin  (KEFLEX ) 500 MG capsule, Take 1 capsule (500 mg total) by mouth 3 (three) times daily., Disp: 21 capsule, Rfl: 0   finasteride  (PROSCAR ) 5 MG tablet, Take 1 tablet (5 mg total) by  mouth daily., Disp: 90 tablet, Rfl: 3   Incontinence Supplies (MALE URINAL) MISC, PRN, Disp: 6 each, Rfl: 0   metFORMIN  (GLUCOPHAGE ) 500 MG tablet, Take 1 tablet (500 mg total) by mouth 2 (two) times daily with a meal., Disp: 180 tablet, Rfl: 1   metoprolol  tartrate (LOPRESSOR ) 25 MG tablet, Take 1 tablet (25 mg total) by mouth 2 (two) times daily., Disp: 60 tablet, Rfl: 2   oxybutynin  (DITROPAN  XL) 10 MG 24 hr tablet, Take 1 tablet (10 mg total) by mouth at bedtime., Disp: 30 tablet, Rfl: 0   tamsulosin  (FLOMAX ) 0.4 MG CAPS capsule, Take 1 capsule (0.4 mg total) by mouth daily after supper., Disp: 90 capsule, Rfl: 3   UNABLE TO FIND, Mechanical Lift Chair Dx: S13.26, Disp: 1 each, Rfl: 0   UNABLE TO FIND, Transfer Bench/Bath Chair Dx: S13.26, Disp: 1 each, Rfl: 0   Physical Exam:   BP 135/85   Pulse (!) 57   SpO2 97%   Pertinent Findings  CN II-XII intact right external auditory canal cerumen impaction, left EAC clear, TM intact with well as middle ear space Weber 512: equal Rinne 512: AC > BC b/l  No lesions of oral cavity/oropharynx; dentition within normal limits No obviously palpable neck masses/lymphadenopathy/thyromegaly No respiratory distress or stridor  Seprately Identifiable Procedures:  Procedure: bilateral ear microscopy and cerumen removal using microscope (CPT [phone]) - Mod 25 Pre-procedure diagnosis: unilateral cerumen impaction right external auditory canal Post-procedure diagnosis: same Indication: bilateral cerumen impaction; given patient's otologic complaints and history as well as for improved and comprehensive examination of external ear and tympanic membrane, bilateral otologic examination using microscope was performed and impacted cerumen removed  Procedure: Patient was placed semi-recumbent. Both ear canals were examined using the microscope with findings above. Cerumen removed from the right external auditory canal using suction and currette with improvement  in EAC examination and patency. Left: EAC was patent. TM was intact . Middle ear was aerated. Drainage: none Right: EAC was patent. TM was intact . Middle ear was aerated . Drainage: none Patient tolerated the procedure well.   Impression & Plans:  Isaiah Ellis is a 63 y.o. male with the following   Cerumen impaction-  The patient presented today with cerumen impaction.  This was removed without difficulty.  I see no signs of infection.  The patient will reach out to the office if she develops any new or worsening signs or symptoms.  She does not feel she has any significant change to her baseline hearing.   - f/u PRN   Thank you for allowing me the opportunity to care for your patient. Please do not hesitate to contact me should you have any other questions.  Sincerely, Chyrl Cohen PA-C [facility] Phone: [phone] Fax: [phone]  06/23/2024, 12:58 PM

## 2024-07-01 ENCOUNTER — Telehealth: Payer: Self-pay | Admitting: *Deleted

## 2024-07-01 ENCOUNTER — Ambulatory Visit (INDEPENDENT_AMBULATORY_CARE_PROVIDER_SITE_OTHER): Admitting: Gastroenterology

## 2024-07-01 ENCOUNTER — Encounter: Payer: Self-pay | Admitting: Gastroenterology

## 2024-07-01 ENCOUNTER — Telehealth: Payer: Self-pay | Admitting: Gastroenterology

## 2024-07-01 VITALS — BP 125/85 | HR 57 | Temp 97.7°F | Ht 73.0 in

## 2024-07-01 DIAGNOSIS — D509 Iron deficiency anemia, unspecified: Secondary | ICD-10-CM | POA: Diagnosis not present

## 2024-07-01 NOTE — Patient Instructions (Signed)
 Colonoscopy to be scheduled. Office will contact [facility] to make arrangements.

## 2024-07-01 NOTE — Telephone Encounter (Signed)
 After patient leaving office, noted he has had chronic microcytic anemia. May be related to CKD but we need to further evaluate. Needs updated labs.   CBC, iron/tibc/ferritin, b12,folate. Please arrange.

## 2024-07-01 NOTE — Progress Notes (Signed)
 GI Office Note    Referring Provider: Tobie Suzzane POUR, MD Primary Care Physician:  Tobie Suzzane POUR, MD  Primary Gastroenterologist: Carlin POUR. Cindie, DO   Chief Complaint   Chief Complaint  Patient presents with   Colonoscopy    History of Present Illness   Isaiah Ellis is a 63 y.o. male presenting today at the request of SNF provider for screening colonoscopy. Due to history of strokes, he was brought in for evaluation to assess appropriateness of procedures.   Discussed the use of AI scribe software for clinical note transcription with the patient, who gave verbal consent to proceed.  He has never had a colonoscopy before. He reports a previous Cologuard test was negative. I don't have that record, but an attempt at [facility] 09/2023 wus unsuccessful (empty collection kit received).   He experiences gastrointestinal symptoms such as upset stomach, vomiting, and diarrhea when consuming certain foods like hot dogs and chef's salads, but not with smaller portions of similar foods. No constipation, diarrhea, stomach pain, heartburn, swallowing difficulties, blood in stool, or unintentional weight loss.  He has a history of multiple strokes, with the last significant event occurring over a couple of years ago. He describes having both major and minor strokes and is currently undergoing therapy to assist with mobility. He primarily uses a wheelchair but can stand and pivot with assistance. The patient reports that the right side of his wheelchair is not locking properly.  His past medical history includes diabetes, gout, high blood pressure, and high cholesterol. He had a urinary tract infection in the past, which has since resolved. No history of heart attacks.  In terms of family history, his father died from strokes, and his mother has also had a stroke. His brother, who is deceased, had high blood pressure and heart disease. There is no known family history of colon  cancer.  Social history reveals past heavy alcohol use and smoking two packs of cigarettes a day until he started having strokes. He also has a history of drug use intranasal (no injectables), including cocaine and crack, but has not used these substances in many years. He denies current alcohol or drug use.  No shortness of breath, chest pain, or sleep apnea. Reports occasional leg swelling.      Prior Data    CT A/P without contrast 10/2023: IMPRESSION: -No CT findings to account for the patient's hematuria. -Thick-walled bladder, likely reflecting sequela of chronic bladder outlet obstruction. -Associated BPH.    Medications   Current Outpatient Medications  Medication Sig Dispense Refill   acetaminophen  (TYLENOL ) 325 MG tablet Take 650 mg by mouth every 6 (six) hours as needed.     amLODipine  (NORVASC ) 10 MG tablet Take 10 mg by mouth daily.     aspirin  81 MG EC tablet Take 1 tablet (81 mg total) by mouth daily. Swallow whole. 30 tablet 12   atorvastatin  (LIPITOR) 40 MG tablet Take 1 tablet (40 mg total) by mouth daily. 90 tablet 1   cholecalciferol (VITAMIN D3) 25 MCG (1000 UNIT) tablet Take 1,000 Units by mouth daily.     divalproex (DEPAKOTE) 125 MG DR tablet Take 125 mg by mouth 3 (three) times daily.     Incontinence Supplies (MALE URINAL) MISC PRN 6 each 0   lisinopril  (ZESTRIL ) 10 MG tablet Take 10 mg by mouth daily.     metoprolol  tartrate (LOPRESSOR ) 25 MG tablet Take 1 tablet (25 mg total) by mouth 2 (two) times  daily. 60 tablet 2   oxybutynin  (DITROPAN  XL) 10 MG 24 hr tablet Take 1 tablet (10 mg total) by mouth at bedtime. 30 tablet 0   tamsulosin  (FLOMAX ) 0.4 MG CAPS capsule Take 1 capsule (0.4 mg total) by mouth daily after supper. 90 capsule 3   vitamin B-12 (CYANOCOBALAMIN) 500 MCG tablet Take 500 mcg by mouth daily.     No current facility-administered medications for this visit.    Allergies   Allergies as of 07/01/2024 - Review Complete 07/01/2024   Allergen Reaction Noted   Bee venom Swelling 02/13/2019     Past Medical History   Past Medical History:  Diagnosis Date   Balance problems    CKD (chronic kidney disease)    Depression    Diabetes mellitus without complication (HCC)    Falling    Gout    Hyperlipidemia    Hypertension    Stroke (HCC) 2005 and 02/2019    Past Surgical History   History reviewed. No pertinent surgical history.  Past Family History   Family History  Problem Relation Age of Onset   Hypertension Mother    Stroke Mother    Heart disease Father    Stroke Father    Hypertension Father    Colon cancer Neg Hx     Past Social History   Social History   Socioeconomic History   Marital status: Single    Spouse name: Not on file   Number of children: 0   Years of education: Not on file   Highest education level: Associate degree: academic program  Occupational History   Not on file  Tobacco Use   Smoking status: Former    Current packs/day: 0.25    Average packs/day: 0.3 packs/day for 40.0 years (10.0 ttl pk-yrs)    Types: Cigarettes   Smokeless tobacco: Never   Tobacco comments:    Quit several years ago after a stroke  Vaping Use   Vaping status: Never Used  Substance and Sexual Activity   Alcohol use: Not Currently    Comment: heavy etoh use in remote past   Drug use: Not Currently    Types: Marijuana, Cocaine    Comment: cocaine, intranasal. no IV drugs. has been many years since last use   Sexual activity: Not on file  Other Topics Concern   Not on file  Social History Narrative   Lives alone   No caffeine   Social Drivers of Health   Financial Resource Strain: Not on file  Food Insecurity: No Food Insecurity (12/18/2022)   Hunger Vital Sign    Worried About Running Out of Food in the Last Year: Never true    Ran Out of Food in the Last Year: Never true  Transportation Needs: No Transportation Needs (12/18/2022)   PRAPARE - Administrator, Civil Service  (Medical): No    Lack of Transportation (Non-Medical): No  Physical Activity: Not on file  Stress: Not on file  Social Connections: Not on file  Intimate Partner Violence: Not At Risk (12/18/2022)   Humiliation, Afraid, Rape, and Kick questionnaire    Fear of Current or Ex-Partner: No    Emotionally Abused: No    Physically Abused: No    Sexually Abused: No    Review of Systems   General: Negative for anorexia, weight loss, fever, chills, fatigue, weakness. Does not ambulate. Able to stand/pivot. ENT: Negative for hoarseness, difficulty swallowing , nasal congestion. CV: Negative for chest pain, angina, palpitations,  dyspnea on exertion, +peripheral edema.  Respiratory: Negative for dyspnea at rest, dyspnea on exertion, cough, sputum, wheezing.  GI: See history of present illness. GU:  Negative for dysuria, hematuria, urinary incontinence, urinary frequency, nocturnal urination.  Endo: Negative for unusual weight change.     Physical Exam   BP 125/85 (BP Location: Left Arm, Patient Position: Sitting, Cuff Size: Normal) Comment (BP Location): taken on lower left forearm due to size of upper arm  Pulse (!) 57   Temp 97.7 F (36.5 C) (Oral)   Ht 6' 1 (1.854 m)   BMI 39.18 kg/m    General: Well-nourished, well-developed in no acute distress.  Eyes: No icterus. Mouth: Oropharyngeal mucosa moist and pink   Lungs: Clear to auscultation bilaterally.  Heart: Regular rate and rhythm, no murmurs rubs or gallops.  Abdomen: Bowel sounds are normal, nontender, nondistended, no hepatosplenomegaly or masses,  no abdominal bruits or hernia , no rebound or guarding. Limited exam in wheelchair and due to body habitus. Rectal: not performed Extremities: 1+ bilateral lower extremity edema. No clubbing or deformities. Neuro: Alert and oriented x 4   Skin: Warm and dry, no jaundice.   Psych: Alert and cooperative, normal mood and affect.  Labs   12/2023: Cre 1.47H, Tbili 0.3, AP 78, AST 21,  ALT 17, TSH 0.59, B12 782, A1C 6.1, WBC 9.2, Hgb 12.9L, Hct 40.1L, MCV78.7L, Plt 215.  HCV Ab neg 2022  Imaging Studies   No results found.  Assessment/Plan:   Colorectal cancer screening: Colorectal cancer screening is due as he is 63 years old and has never had a colonoscopy. Previous Cologuard test was not completed last year although patient believes it was negative. No family history of colon cancer and no significant bowel issues. Recent blood work showed chronic mild microcytic anemia in setting of CKD. - Schedule colonoscopy. ASA 3.  I have discussed the risks, alternatives, benefits with regards to but not limited to the risk of reaction to medication, bleeding, infection, perforation and the patient is agreeable to proceed. Written consent to be obtained.   Microcytic anemia:  Dating back to 11/2021 in setting of CKD. No reported overt GI bleeding. Hemoccult status unknown. No UGI symptoms outside of food intolerances (but does fine when he avoids).  -colonoscopy as scheduled. Cannot rule out need for further evaluation for IDA depending on colonoscopy results and if his anemia trends downward.  -we will request updated CBC, iron/tibc/ferritin/b12/folate.     Sonny RAMAN. Ezzard, MHS, PA-C [facility]

## 2024-07-01 NOTE — Telephone Encounter (Signed)
 Called [facility], no answer and not able to leave VM Pt needs TCS with Dr. Cindie or Dr. Shaaron, ASA 3

## 2024-07-01 NOTE — H&P (View-Only) (Signed)
 GI Office Note    Referring Provider: Tobie Suzzane POUR, MD Primary Care Physician:  Tobie Suzzane POUR, MD  Primary Gastroenterologist: Carlin POUR. Cindie, DO   Chief Complaint   Chief Complaint  Patient presents with   Colonoscopy    History of Present Illness   Isaiah Ellis is a 63 y.o. male presenting today at the request of SNF provider for screening colonoscopy. Due to history of strokes, he was brought in for evaluation to assess appropriateness of procedures.   Discussed the use of AI scribe software for clinical note transcription with the patient, who gave verbal consent to proceed.  He has never had a colonoscopy before. He reports a previous Cologuard test was negative. I don't have that record, but an attempt at [facility] 09/2023 wus unsuccessful (empty collection kit received).   He experiences gastrointestinal symptoms such as upset stomach, vomiting, and diarrhea when consuming certain foods like hot dogs and chef's salads, but not with smaller portions of similar foods. No constipation, diarrhea, stomach pain, heartburn, swallowing difficulties, blood in stool, or unintentional weight loss.  He has a history of multiple strokes, with the last significant event occurring over a couple of years ago. He describes having both major and minor strokes and is currently undergoing therapy to assist with mobility. He primarily uses a wheelchair but can stand and pivot with assistance. The patient reports that the right side of his wheelchair is not locking properly.  His past medical history includes diabetes, gout, high blood pressure, and high cholesterol. He had a urinary tract infection in the past, which has since resolved. No history of heart attacks.  In terms of family history, his father died from strokes, and his mother has also had a stroke. His brother, who is deceased, had high blood pressure and heart disease. There is no known family history of colon  cancer.  Social history reveals past heavy alcohol use and smoking two packs of cigarettes a day until he started having strokes. He also has a history of drug use intranasal (no injectables), including cocaine and crack, but has not used these substances in many years. He denies current alcohol or drug use.  No shortness of breath, chest pain, or sleep apnea. Reports occasional leg swelling.      Prior Data    CT A/P without contrast 10/2023: IMPRESSION: -No CT findings to account for the patient's hematuria. -Thick-walled bladder, likely reflecting sequela of chronic bladder outlet obstruction. -Associated BPH.    Medications   Current Outpatient Medications  Medication Sig Dispense Refill   acetaminophen  (TYLENOL ) 325 MG tablet Take 650 mg by mouth every 6 (six) hours as needed.     amLODipine  (NORVASC ) 10 MG tablet Take 10 mg by mouth daily.     aspirin  81 MG EC tablet Take 1 tablet (81 mg total) by mouth daily. Swallow whole. 30 tablet 12   atorvastatin  (LIPITOR) 40 MG tablet Take 1 tablet (40 mg total) by mouth daily. 90 tablet 1   cholecalciferol (VITAMIN D3) 25 MCG (1000 UNIT) tablet Take 1,000 Units by mouth daily.     divalproex (DEPAKOTE) 125 MG DR tablet Take 125 mg by mouth 3 (three) times daily.     Incontinence Supplies (MALE URINAL) MISC PRN 6 each 0   lisinopril  (ZESTRIL ) 10 MG tablet Take 10 mg by mouth daily.     metoprolol  tartrate (LOPRESSOR ) 25 MG tablet Take 1 tablet (25 mg total) by mouth 2 (two) times  daily. 60 tablet 2   oxybutynin  (DITROPAN  XL) 10 MG 24 hr tablet Take 1 tablet (10 mg total) by mouth at bedtime. 30 tablet 0   tamsulosin  (FLOMAX ) 0.4 MG CAPS capsule Take 1 capsule (0.4 mg total) by mouth daily after supper. 90 capsule 3   vitamin B-12 (CYANOCOBALAMIN) 500 MCG tablet Take 500 mcg by mouth daily.     No current facility-administered medications for this visit.    Allergies   Allergies as of 07/01/2024 - Review Complete 07/01/2024   Allergen Reaction Noted   Bee venom Swelling 02/13/2019     Past Medical History   Past Medical History:  Diagnosis Date   Balance problems    CKD (chronic kidney disease)    Depression    Diabetes mellitus without complication (HCC)    Falling    Gout    Hyperlipidemia    Hypertension    Stroke (HCC) 2005 and 02/2019    Past Surgical History   History reviewed. No pertinent surgical history.  Past Family History   Family History  Problem Relation Age of Onset   Hypertension Mother    Stroke Mother    Heart disease Father    Stroke Father    Hypertension Father    Colon cancer Neg Hx     Past Social History   Social History   Socioeconomic History   Marital status: Single    Spouse name: Not on file   Number of children: 0   Years of education: Not on file   Highest education level: Associate degree: academic program  Occupational History   Not on file  Tobacco Use   Smoking status: Former    Current packs/day: 0.25    Average packs/day: 0.3 packs/day for 40.0 years (10.0 ttl pk-yrs)    Types: Cigarettes   Smokeless tobacco: Never   Tobacco comments:    Quit several years ago after a stroke  Vaping Use   Vaping status: Never Used  Substance and Sexual Activity   Alcohol use: Not Currently    Comment: heavy etoh use in remote past   Drug use: Not Currently    Types: Marijuana, Cocaine    Comment: cocaine, intranasal. no IV drugs. has been many years since last use   Sexual activity: Not on file  Other Topics Concern   Not on file  Social History Narrative   Lives alone   No caffeine   Social Drivers of Health   Financial Resource Strain: Not on file  Food Insecurity: No Food Insecurity (12/18/2022)   Hunger Vital Sign    Worried About Running Out of Food in the Last Year: Never true    Ran Out of Food in the Last Year: Never true  Transportation Needs: No Transportation Needs (12/18/2022)   PRAPARE - Administrator, Civil Service  (Medical): No    Lack of Transportation (Non-Medical): No  Physical Activity: Not on file  Stress: Not on file  Social Connections: Not on file  Intimate Partner Violence: Not At Risk (12/18/2022)   Humiliation, Afraid, Rape, and Kick questionnaire    Fear of Current or Ex-Partner: No    Emotionally Abused: No    Physically Abused: No    Sexually Abused: No    Review of Systems   General: Negative for anorexia, weight loss, fever, chills, fatigue, weakness. Does not ambulate. Able to stand/pivot. ENT: Negative for hoarseness, difficulty swallowing , nasal congestion. CV: Negative for chest pain, angina, palpitations,  dyspnea on exertion, +peripheral edema.  Respiratory: Negative for dyspnea at rest, dyspnea on exertion, cough, sputum, wheezing.  GI: See history of present illness. GU:  Negative for dysuria, hematuria, urinary incontinence, urinary frequency, nocturnal urination.  Endo: Negative for unusual weight change.     Physical Exam   BP 125/85 (BP Location: Left Arm, Patient Position: Sitting, Cuff Size: Normal) Comment (BP Location): taken on lower left forearm due to size of upper arm  Pulse (!) 57   Temp 97.7 F (36.5 C) (Oral)   Ht 6' 1 (1.854 m)   BMI 39.18 kg/m    General: Well-nourished, well-developed in no acute distress.  Eyes: No icterus. Mouth: Oropharyngeal mucosa moist and pink   Lungs: Clear to auscultation bilaterally.  Heart: Regular rate and rhythm, no murmurs rubs or gallops.  Abdomen: Bowel sounds are normal, nontender, nondistended, no hepatosplenomegaly or masses,  no abdominal bruits or hernia , no rebound or guarding. Limited exam in wheelchair and due to body habitus. Rectal: not performed Extremities: 1+ bilateral lower extremity edema. No clubbing or deformities. Neuro: Alert and oriented x 4   Skin: Warm and dry, no jaundice.   Psych: Alert and cooperative, normal mood and affect.  Labs   12/2023: Cre 1.47H, Tbili 0.3, AP 78, AST 21,  ALT 17, TSH 0.59, B12 782, A1C 6.1, WBC 9.2, Hgb 12.9L, Hct 40.1L, MCV78.7L, Plt 215.  HCV Ab neg 2022  Imaging Studies   No results found.  Assessment/Plan:   Colorectal cancer screening: Colorectal cancer screening is due as he is 63 years old and has never had a colonoscopy. Previous Cologuard test was not completed last year although patient believes it was negative. No family history of colon cancer and no significant bowel issues. Recent blood work showed chronic mild microcytic anemia in setting of CKD. - Schedule colonoscopy. ASA 3.  I have discussed the risks, alternatives, benefits with regards to but not limited to the risk of reaction to medication, bleeding, infection, perforation and the patient is agreeable to proceed. Written consent to be obtained.   Microcytic anemia:  Dating back to 11/2021 in setting of CKD. No reported overt GI bleeding. Hemoccult status unknown. No UGI symptoms outside of food intolerances (but does fine when he avoids).  -colonoscopy as scheduled. Cannot rule out need for further evaluation for IDA depending on colonoscopy results and if his anemia trends downward.  -we will request updated CBC, iron/tibc/ferritin/b12/folate.     Sonny RAMAN. Ezzard, MHS, PA-C [facility]

## 2024-07-01 NOTE — Telephone Encounter (Signed)
 No ans at facility, will try back later.

## 2024-07-04 NOTE — Telephone Encounter (Signed)
 Spoke with nurse [place] and she stated that she is going to put the requested labs in as stat so that they can get the results back to you.

## 2024-07-06 NOTE — Telephone Encounter (Signed)
 Called [facility] and had to [facility] for scheduler

## 2024-07-12 NOTE — Telephone Encounter (Signed)
 Lorn, can you request the labs ordered from last week from patients facility. See tammy's note.

## 2024-07-20 NOTE — Telephone Encounter (Signed)
 Called [facility] to schedule pt for colonoscopy. Spoke with scheduler and she stated she was leaving the office and would be back tomorrow morning and would give me a call back then. Left message on her vm as well.

## 2024-07-21 MED ORDER — PEG 3350-KCL-NA BICARB-NACL 420 G PO SOLR: 4000.0000 mL | Freq: Once | ORAL | 0 refills | Status: AC

## 2024-07-21 NOTE — Telephone Encounter (Signed)
 Pre-op appt scheduled for 07/25/2024 at 12:45pm, faxing to [phone].

## 2024-07-21 NOTE — Telephone Encounter (Signed)
 Labs from 07/04/24: Hgb13, Hct 39.5, MCV 77.6, Plt 167, iron 51, tibc 282, iron sat 16, b12 689, folic acid 10.2   Proceed with colonoscopy. Based on findings, can decide if upper endoscopy is needed. He has very anemia with mixed picture, possibly some ida and anemia of chronic disease.

## 2024-07-21 NOTE — Patient Instructions (Signed)
 Isaiah Ellis  07/21/2024     @PREFPERIOPPHARMACY @   Your procedure is scheduled on  07/27/2024.   Report to [facility] at  0845 A.M.   Call this number if you have problems the morning of surgery:  [phone]  If you experience any cold or flu symptoms such as cough, fever, chills, shortness of breath, etc. between now and your scheduled surgery, please notify us  at the above number.   Remember:  Follow the diet and prep instructions given to you by the office.    You may drink clear liquids until 0645 am on 07/27/2024.    Clear liquids allowed are:                    Water, Juice (No red color; non-citric and without pulp; diabetics please choose diet or no sugar options), Carbonated beverages (diabetics please choose diet or no sugar options), Clear Tea (No creamer, milk, or cream, including half & half and powdered creamer), Black Coffee Only (No creamer, milk or cream, including half & half and powdered creamer), and Clear Sports drink (No red color; diabetics please choose diet or no sugar options)    Take these medicines the morning of surgery with A SIP OF WATER                                                                amlodipone, depakote, metoprolol .    Do not wear jewelry, make-up or nail polish, including gel polish,  artificial nails, or any other type of covering on natural nails (fingers and  toes).  Do not wear lotions, powders, or perfumes, or deodorant.  Do not shave 48 hours prior to surgery.  Men may shave face and neck.  Do not bring valuables to the hospital.  [facility] is not responsible for any belongings or valuables.  Contacts, dentures or bridgework may not be worn into surgery.  Leave your suitcase in the car.  After surgery it may be brought to your room.  For patients admitted to the hospital, discharge time will be determined by your treatment team.  Patients discharged the day of surgery will not be allowed to drive home and  must have someone with them for 24 hours.    Special instructions:   DO NOT smoke tobacco or vape for 24 hours before your procedure.  Please read over the following fact sheets that you were given. Anesthesia Post-op Instructions and Care and Recovery After Surgery      Colonoscopy, Adult, Care After The following information offers guidance on how to care for yourself after your procedure. Your health care provider may also give you more specific instructions. If you have problems or questions, contact your health care provider. What can I expect after the procedure? After the procedure, it is common to have: A small amount of blood in your stool for 24 hours after the procedure. Some gas. Mild cramping or bloating of your abdomen. Follow these instructions at home: Eating and drinking  Drink enough fluid to keep your urine pale yellow. Follow instructions from your health care provider about eating or drinking restrictions. Resume your normal diet as told by your health care provider. Avoid heavy or fried foods  that are hard to digest. Activity Rest as told by your health care provider. Avoid sitting for a long time without moving. Get up to take short walks every 1-2 hours. This is important to improve blood flow and breathing. Ask for help if you feel weak or unsteady. Return to your normal activities as told by your health care provider. Ask your health care provider what activities are safe for you. Managing cramping and bloating  Try walking around when you have cramps or feel bloated. If directed, apply heat to your abdomen as told by your health care provider. Use the heat source that your health care provider recommends, such as a moist heat pack or a heating pad. Place a towel between your skin and the heat source. Leave the heat on for 20-30 minutes. Remove the heat if your skin turns bright red. This is especially important if you are unable to feel pain, heat, or cold.  You have a greater risk of getting burned. General instructions If you were given a sedative during the procedure, it can affect you for several hours. Do not drive or operate machinery until your health care provider says that it is safe. For the first 24 hours after the procedure: Do not sign important documents. Do not drink alcohol. Do your regular daily activities at a slower pace than normal. Eat soft foods that are easy to digest. Take over-the-counter and prescription medicines only as told by your health care provider. Keep all follow-up visits. This is important. Contact a health care provider if: You have blood in your stool 2-3 days after the procedure. Get help right away if: You have more than a small spotting of blood in your stool. You have large blood clots in your stool. You have swelling of your abdomen. You have nausea or vomiting. You have a fever. You have increasing pain in your abdomen that is not relieved with medicine. These symptoms may be an emergency. Get help right away. Call 911. Do not wait to see if the symptoms will go away. Do not drive yourself to the hospital. Summary After the procedure, it is common to have a small amount of blood in your stool. You may also have mild cramping and bloating of your abdomen. If you were given a sedative during the procedure, it can affect you for several hours. Do not drive or operate machinery until your health care provider says that it is safe. Get help right away if you have a lot of blood in your stool, nausea or vomiting, a fever, or increased pain in your abdomen. This information is not intended to replace advice given to you by your health care provider. Make sure you discuss any questions you have with your health care provider. Document Revised: 12/16/2022 Document Reviewed: 06/26/2021 Elsevier Patient Education  2024 [employer]General Anesthesia, Adult, Care After The following information offers  guidance on how to care for yourself after your procedure. Your health care provider may also give you more specific instructions. If you have problems or questions, contact your health care provider. What can I expect after the procedure? After the procedure, it is common for people to: Have pain or discomfort at the IV site. Have nausea or vomiting. Have a sore throat or hoarseness. Have trouble concentrating. Feel cold or chills. Feel weak, sleepy, or tired (fatigue). Have soreness and body aches. These can affect parts of the body that were not involved in surgery. Follow these instructions at home: For the  time period you were told by your health care provider:  Rest. Do not participate in activities where you could fall or become injured. Do not drive or use machinery. Do not drink alcohol. Do not take sleeping pills or medicines that cause drowsiness. Do not make important decisions or sign legal documents. Do not take care of children on your own. General instructions Drink enough fluid to keep your urine pale yellow. If you have sleep apnea, surgery and certain medicines can increase your risk for breathing problems. Follow instructions from your health care provider about wearing your sleep device: Anytime you are sleeping, including during daytime naps. While taking prescription pain medicines, sleeping medicines, or medicines that make you drowsy. Return to your normal activities as told by your health care provider. Ask your health care provider what activities are safe for you. Take over-the-counter and prescription medicines only as told by your health care provider. Do not use any products that contain nicotine or tobacco. These products include cigarettes, chewing tobacco, and vaping devices, such as e-cigarettes. These can delay incision healing after surgery. If you need help quitting, ask your health care provider. Contact a health care provider if: You have nausea or  vomiting that does not get better with medicine. You vomit every time you eat or drink. You have pain that does not get better with medicine. You cannot urinate or have bloody urine. You develop a skin rash. You have a fever. Get help right away if: You have trouble breathing. You have chest pain. You vomit blood. These symptoms may be an emergency. Get help right away. Call 911. Do not wait to see if the symptoms will go away. Do not drive yourself to the hospital. Summary After the procedure, it is common to have a sore throat, hoarseness, nausea, vomiting, or to feel weak, sleepy, or fatigue. For the time period you were told by your health care provider, do not drive or use machinery. Get help right away if you have difficulty breathing, have chest pain, or vomit blood. These symptoms may be an emergency. This information is not intended to replace advice given to you by your health care provider. Make sure you discuss any questions you have with your health care provider. Document Revised: 01/31/2022 Document Reviewed: 01/31/2022 Elsevier Patient Education  2024 [employer].

## 2024-07-21 NOTE — Telephone Encounter (Signed)
 Spoke with [facility], scheduled pt for colonoscopy on 07/27/2024 at 10:45am. Rx being sent to pharmacy. Instructions being faxed to [facility] at [phone].

## 2024-07-21 NOTE — Addendum Note (Signed)
 Addended by: DALLIE LIONEL RAMAN on: 07/21/2024 09:12 AM   Modules accepted: Orders

## 2024-07-25 ENCOUNTER — Encounter (HOSPITAL_COMMUNITY): Payer: Self-pay

## 2024-07-25 ENCOUNTER — Encounter (HOSPITAL_COMMUNITY)
Admission: RE | Admit: 2024-07-25 | Discharge: 2024-07-25 | Disposition: A | Discharge status: Home / Self Care | Source: Ambulatory Visit | Attending: Internal Medicine | Admitting: Internal Medicine

## 2024-07-25 ENCOUNTER — Other Ambulatory Visit: Payer: Self-pay

## 2024-07-25 VITALS — BP 112/89 | HR 60 | Temp 98.0°F | Resp 20 | Ht 73.0 in | Wt 297.0 lb

## 2024-07-25 DIAGNOSIS — D509 Iron deficiency anemia, unspecified: Secondary | ICD-10-CM | POA: Insufficient documentation

## 2024-07-25 DIAGNOSIS — Z01818 Encounter for other preprocedural examination: Secondary | ICD-10-CM | POA: Insufficient documentation

## 2024-07-25 DIAGNOSIS — E1169 Type 2 diabetes mellitus with other specified complication: Secondary | ICD-10-CM | POA: Insufficient documentation

## 2024-07-25 DIAGNOSIS — I110 Hypertensive heart disease with heart failure: Secondary | ICD-10-CM | POA: Diagnosis not present

## 2024-07-25 DIAGNOSIS — I10 Essential (primary) hypertension: Secondary | ICD-10-CM

## 2024-07-25 DIAGNOSIS — F17200 Nicotine dependence, unspecified, uncomplicated: Secondary | ICD-10-CM

## 2024-07-25 LAB — CBC WITH DIFFERENTIAL/PLATELET
Abs Immature Granulocytes: 0.02 K/uL (ref 0.00–0.07)
Basophils Absolute: 0.1 K/uL (ref 0.0–0.1)
Basophils Relative: 1 %
Eosinophils Absolute: 0.5 K/uL (ref 0.0–0.5)
Eosinophils Relative: 6 %
HCT: 42 % (ref 39.0–52.0)
Hemoglobin: 13.7 g/dL (ref 13.0–17.0)
Immature Granulocytes: 0 %
Lymphocytes Relative: 16 %
Lymphs Abs: 1.2 K/uL (ref 0.7–4.0)
MCH: 25.6 pg — ABNORMAL LOW (ref 26.0–34.0)
MCHC: 32.6 g/dL (ref 30.0–36.0)
MCV: 78.4 fL — ABNORMAL LOW (ref 80.0–100.0)
Monocytes Absolute: 1.2 K/uL — ABNORMAL HIGH (ref 0.1–1.0)
Monocytes Relative: 16 %
Neutro Abs: 4.6 K/uL (ref 1.7–7.7)
Neutrophils Relative %: 61 %
Platelets: 138 K/uL — ABNORMAL LOW (ref 150–400)
RBC: 5.36 MIL/uL (ref 4.22–5.81)
RDW: 16.2 % — ABNORMAL HIGH (ref 11.5–15.5)
WBC: 7.4 K/uL (ref 4.0–10.5)
nRBC: 0 % (ref 0.0–0.2)

## 2024-07-25 LAB — BASIC METABOLIC PANEL WITH GFR
Anion gap: 10 (ref 5–15)
BUN: 17 mg/dL (ref 8–23)
CO2: 23 mmol/L (ref 22–32)
Calcium: 9.1 mg/dL (ref 8.9–10.3)
Chloride: 104 mmol/L (ref 98–111)
Creatinine, Ser: 1.22 mg/dL (ref 0.61–1.24)
GFR, Estimated: >60 mL/min (ref >60)
Glucose, Bld: 70 mg/dL (ref 70–99)
Potassium: 3.9 mmol/L (ref 3.5–5.1)
Sodium: 137 mmol/L (ref 135–145)

## 2024-07-25 NOTE — Progress Notes (Signed)
   07/25/24 1322  OBSTRUCTIVE SLEEP APNEA  Have you ever been diagnosed with sleep apnea through a sleep study? No  Do you snore loudly (loud enough to be heard through closed doors)?  0  Do you often feel tired, fatigued, or sleepy during the daytime (such as falling asleep during driving or talking to someone)? 0  Has anyone observed you stop breathing during your sleep? 0  Do you have, or are you being treated for high blood pressure? 1  BMI more than 35 kg/m2? 1  Age > 50 (1-yes) 1  Neck circumference greater than:Male 16 inches or larger, Male 17inches or larger? 0  Male Gender (Yes=1) 1  Obstructive Sleep Apnea Score 4

## 2024-07-27 ENCOUNTER — Ambulatory Visit (HOSPITAL_COMMUNITY)
Admission: RE | Admit: 2024-07-27 | Discharge: 2024-07-27 | Disposition: A | Discharge status: Home / Self Care | Payer: Medicare (Managed Care) | Attending: Internal Medicine | Admitting: Internal Medicine

## 2024-07-27 ENCOUNTER — Encounter (HOSPITAL_COMMUNITY): Payer: Self-pay | Admitting: Internal Medicine

## 2024-07-27 ENCOUNTER — Encounter (HOSPITAL_COMMUNITY)
Admission: RE | Disposition: A | Discharge status: Home / Self Care | Payer: Self-pay | Source: Home / Self Care | Attending: Internal Medicine

## 2024-07-27 ENCOUNTER — Ambulatory Visit (HOSPITAL_COMMUNITY): Payer: Medicare (Managed Care)

## 2024-07-27 ENCOUNTER — Ambulatory Visit (HOSPITAL_BASED_OUTPATIENT_CLINIC_OR_DEPARTMENT_OTHER): Payer: Medicare (Managed Care)

## 2024-07-27 ENCOUNTER — Other Ambulatory Visit: Payer: Self-pay

## 2024-07-27 DIAGNOSIS — Z1211 Encounter for screening for malignant neoplasm of colon: Secondary | ICD-10-CM | POA: Diagnosis not present

## 2024-07-27 DIAGNOSIS — I10 Essential (primary) hypertension: Secondary | ICD-10-CM

## 2024-07-27 DIAGNOSIS — K648 Other hemorrhoids: Secondary | ICD-10-CM | POA: Insufficient documentation

## 2024-07-27 DIAGNOSIS — K5730 Diverticulosis of large intestine without perforation or abscess without bleeding: Secondary | ICD-10-CM

## 2024-07-27 DIAGNOSIS — I679 Cerebrovascular disease, unspecified: Secondary | ICD-10-CM

## 2024-07-27 DIAGNOSIS — D509 Iron deficiency anemia, unspecified: Secondary | ICD-10-CM | POA: Insufficient documentation

## 2024-07-27 DIAGNOSIS — E1169 Type 2 diabetes mellitus with other specified complication: Secondary | ICD-10-CM

## 2024-07-27 HISTORY — PX: COLONOSCOPY: SHX5424

## 2024-07-27 LAB — GLUCOSE, CAPILLARY: Glucose-Capillary: 75 mg/dL (ref 70–99)

## 2024-07-27 SURGERY — COLONOSCOPY
Anesthesia: General

## 2024-07-27 MED ORDER — LACTATED RINGERS IV SOLN: INTRAVENOUS | Status: DC | PRN

## 2024-07-27 MED ORDER — PROPOFOL 500 MG/50ML IV EMUL
INTRAVENOUS | Status: DC | PRN
  Administered 2024-07-27: 200 ug/kg/min via INTRAVENOUS

## 2024-07-27 MED ORDER — PROPOFOL 10 MG/ML IV BOLUS
INTRAVENOUS | Status: DC | PRN
  Administered 2024-07-27: 100 mg via INTRAVENOUS

## 2024-07-27 MED ORDER — PHENYLEPHRINE 80 MCG/ML (10ML) SYRINGE FOR IV PUSH (FOR BLOOD PRESSURE SUPPORT)
PREFILLED_SYRINGE | INTRAVENOUS | Status: DC | PRN
  Administered 2024-07-27: 80 ug via INTRAVENOUS
  Administered 2024-07-27: 160 ug via INTRAVENOUS

## 2024-07-27 NOTE — Discharge Instructions (Addendum)

## 2024-07-27 NOTE — Op Note (Signed)
 [facility] Patient Name: Isaiah Ellis Procedure Date: 07/27/2024 9:59 AM MRN: [id number] Date of Birth: [birth year] Attending MD: Isaiah Ellis , [place], [id number] CSN: [id number] Age: 63 Admit Type: Outpatient Procedure:                Colonoscopy Indications:              Screening for colorectal malignant neoplasm Providers:                Isaiah POUR. Cindie, DO, Leandrew Edelman RN, RN, Daphne Mulch Technician, Technician Referring MD:              Medicines:                See the Anesthesia note for documentation of the                            administered medications Complications:            No immediate complications. Estimated Blood Loss:     Estimated blood loss: none. Procedure:                Pre-Anesthesia Assessment:                           - The anesthesia plan was to use monitored                            anesthesia care (MAC).                           After obtaining informed consent, the colonoscope                            was passed under direct vision. Throughout the                            procedure, the patient's blood pressure, pulse, and                            oxygen saturations were monitored continuously. The                            [id number]-HQ190L ([id number]) Peds Colon was introduced                            through the anus and advanced to the the terminal                            ileum, with identification of the appendiceal                            orifice and IC valve. The colonoscopy was performed                            without difficulty. The  patient tolerated the                            procedure well. The quality of the bowel                            preparation was evaluated using the BBPS [facility]                            Bowel Preparation Scale) with scores of: Right                            Colon = 3, Transverse Colon = 3 and Left Colon = 3                            (entire mucosa seen  well with no residual staining,                            small fragments of stool or opaque liquid). The                            total BBPS score equals 9. Scope In: 10:48:28 AM Scope Out: 10:57:47 AM Scope Withdrawal Time: 0 hours 7 minutes 31 seconds  Total Procedure Duration: 0 hours 9 minutes 19 seconds  Findings:      Non-bleeding internal hemorrhoids were found. The hemorrhoids were       moderate.      Scattered medium-mouthed and small-mouthed diverticula were found in the       sigmoid colon, descending colon and ascending colon.      The terminal ileum appeared normal.      The exam was otherwise without abnormality. Impression:               - Non-bleeding internal hemorrhoids.                           - Diverticulosis in the sigmoid colon, in the                            descending colon and in the ascending colon.                           - The examined portion of the ileum was normal.                           - The examination was otherwise normal.                           - No specimens collected. Moderate Sedation:      Per Anesthesia Care Recommendation:           - Patient has a contact number available for                            emergencies. The signs and symptoms of potential  delayed complications were discussed with the                            patient. Return to normal activities tomorrow.                            Written discharge instructions were provided to the                            patient.                           - Resume previous diet.                           - Continue present medications.                           - Repeat colonoscopy in 10 years for screening                            purposes.                           - Return to GI clinic PRN. Procedure Code(s):        --- Professional ---                           H9878, Colorectal cancer screening; colonoscopy on                             individual not meeting criteria for high risk Diagnosis Code(s):        --- Professional ---                           Z12.11, Encounter for screening for malignant                            neoplasm of colon                           K64.8, Other hemorrhoids                           K57.30, Diverticulosis of large intestine without                            perforation or abscess without bleeding CPT copyright 2022 American Medical Association. All rights reserved. The codes documented in this report are preliminary and upon coder review may  be revised to meet current compliance requirements. Isaiah POUR. Cindie, DO Isaiah POUR. Uriel Dowding, DO 07/27/2024 11:00:29 AM This report has been signed electronically. Number of Addenda: 0

## 2024-07-27 NOTE — Anesthesia Postprocedure Evaluation (Signed)
 Anesthesia Post Note  Patient: Isaiah Ellis  Procedure(s) Performed: COLONOSCOPY  Patient location during evaluation: PACU Anesthesia Type: General Level of consciousness: awake and alert Pain management: pain level controlled Vital Signs Assessment: post-procedure vital signs reviewed and stable Respiratory status: spontaneous breathing, nonlabored ventilation, respiratory function stable and patient connected to nasal cannula oxygen Cardiovascular status: blood pressure returned to baseline and stable Postop Assessment: no apparent nausea or vomiting Anesthetic complications: no   No notable events documented.   Last Vitals:  Vitals:   07/27/24 1024 07/27/24 1104  BP: 115/74 108/68  Pulse:  62  Resp: 15 (!) 22  Temp: 36.7 C 36.7 C  SpO2:  94%    Last Pain:  Vitals:   07/27/24 1104  TempSrc: Oral  PainSc: 0-No pain                 Andrea Limes

## 2024-07-27 NOTE — Anesthesia Preprocedure Evaluation (Addendum)
 Anesthesia Evaluation  Patient identified by MRN, date of birth, ID band Patient awake    Reviewed: Allergy & Precautions, H&P , NPO status , Patient's Chart, lab work & pertinent test results  Airway Mallampati: II  TM Distance: >3 FB Neck ROM: Full    Dental no notable dental hx.    Pulmonary former smoker   Pulmonary exam normal breath sounds clear to auscultation       Cardiovascular hypertension, Normal cardiovascular exam Rhythm:Regular Rate:Normal     Neuro/Psych  PSYCHIATRIC DISORDERS  Depression    CVA one year ago; difficulty with speech and right upper and lower extremity weakness CVA    GI/Hepatic negative GI ROS, Neg liver ROS,,,  Endo/Other  diabetes  Class 3 obesityOff oral diabetes meds Nursing home checks sugars regularly  Renal/GU Renal InsufficiencyRenal disease  negative genitourinary   Musculoskeletal negative musculoskeletal ROS (+)    Abdominal   Peds negative pediatric ROS (+)  Hematology  (+) Blood dyscrasia, anemia   Anesthesia Other Findings   Reproductive/Obstetrics negative OB ROS                              Anesthesia Physical Anesthesia Plan  ASA: 3  Anesthesia Plan: General   Post-op Pain Management:    Induction: Intravenous  PONV Risk Score and Plan:   Airway Management Planned: Nasal Cannula  Additional Equipment:   Intra-op Plan:   Post-operative Plan:   Informed Consent: I have reviewed the patients History and Physical, chart, labs and discussed the procedure including the risks, benefits and alternatives for the proposed anesthesia with the patient or authorized representative who has indicated his/her understanding and acceptance.     Dental advisory given  Plan Discussed with: CRNA  Anesthesia Plan Comments:          Anesthesia Quick Evaluation

## 2024-07-27 NOTE — Transfer of Care (Signed)
 Immediate Anesthesia Transfer of Care Note  Patient: Isaiah Ellis  Procedure(s) Performed: COLONOSCOPY  Patient Location: Short Stay  Anesthesia Type:General  Level of Consciousness: awake, alert , oriented, and patient cooperative  Airway & Oxygen Therapy: Patient Spontanous Breathing  Post-op Assessment: Report given to RN, Post -op Vital signs reviewed and stable, and Patient moving all extremities X 4  Post vital signs: Reviewed and stable  Last Vitals:  Vitals Value Taken Time  BP    Temp    Pulse    Resp    SpO2      Last Pain:  Vitals:   07/27/24 1041  TempSrc:   PainSc: 0-No pain      Patients Stated Pain Goal: 8 (07/27/24 1024)  Complications: No notable events documented.

## 2024-07-27 NOTE — Interval H&P Note (Signed)
 History and Physical Interval Note:  07/27/2024 10:06 AM  Isaiah Ellis  has presented today for surgery, with the diagnosis of screening.  The various methods of treatment have been discussed with the patient and family. After consideration of risks, benefits and other options for treatment, the patient has consented to  Procedure(s) with comments: COLONOSCOPY (N/A) - 10:45am, ASA 3 as a surgical intervention.  The patient's history has been reviewed, patient examined, no change in status, stable for surgery.  I have reviewed the patient's chart and labs.  Questions were answered to the patient's satisfaction.     [name]

## 2024-07-28 ENCOUNTER — Encounter (HOSPITAL_COMMUNITY): Payer: Self-pay | Admitting: Internal Medicine
